# Patient Record
Sex: Female | Born: 1935
Health system: Southern US, Community
[De-identification: ages and names within clinical notes are randomized; demographics above are authoritative.]

## PROBLEM LIST (undated history)

## (undated) ENCOUNTER — Ambulatory Visit (HOSPITAL_COMMUNITY): Discharge status: Absent without leave | Payer: Medicare Other

## (undated) DIAGNOSIS — D649 Anemia, unspecified: Secondary | ICD-10-CM

## (undated) DIAGNOSIS — G4730 Sleep apnea, unspecified: Secondary | ICD-10-CM

## (undated) DIAGNOSIS — I6529 Occlusion and stenosis of unspecified carotid artery: Secondary | ICD-10-CM

## (undated) DIAGNOSIS — T7840XA Allergy, unspecified, initial encounter: Secondary | ICD-10-CM

## (undated) DIAGNOSIS — J45909 Unspecified asthma, uncomplicated: Secondary | ICD-10-CM

## (undated) DIAGNOSIS — E785 Hyperlipidemia, unspecified: Secondary | ICD-10-CM

## (undated) DIAGNOSIS — M810 Age-related osteoporosis without current pathological fracture: Secondary | ICD-10-CM

## (undated) DIAGNOSIS — M1990 Unspecified osteoarthritis, unspecified site: Secondary | ICD-10-CM

## (undated) DIAGNOSIS — K219 Gastro-esophageal reflux disease without esophagitis: Secondary | ICD-10-CM

## (undated) DIAGNOSIS — IMO0001 Reserved for inherently not codable concepts without codable children: Secondary | ICD-10-CM

## (undated) DIAGNOSIS — G709 Myoneural disorder, unspecified: Secondary | ICD-10-CM

## (undated) DIAGNOSIS — I10 Essential (primary) hypertension: Secondary | ICD-10-CM

## (undated) HISTORY — PX: OTHER SURGICAL HISTORY: SHX169

## (undated) HISTORY — DX: Allergy, unspecified, initial encounter: T78.40XA

## (undated) HISTORY — DX: Occlusion and stenosis of unspecified carotid artery: I65.29

## (undated) HISTORY — DX: Myoneural disorder, unspecified: G70.9

## (undated) HISTORY — DX: Hyperlipidemia, unspecified: E78.5

## (undated) HISTORY — DX: Unspecified osteoarthritis, unspecified site: M19.90

## (undated) HISTORY — DX: Sleep apnea, unspecified: G47.30

## (undated) HISTORY — DX: Anemia, unspecified: D64.9

## (undated) HISTORY — DX: Unspecified asthma, uncomplicated: J45.909

## (undated) HISTORY — DX: Gastro-esophageal reflux disease without esophagitis: K21.9

## (undated) HISTORY — DX: Essential (primary) hypertension: I10

## (undated) HISTORY — DX: Age-related osteoporosis without current pathological fracture: M81.0

## (undated) HISTORY — PX: COLONOSCOPY: SHX174

## (undated) HISTORY — PX: POLYPECTOMY: SHX149

## (undated) HISTORY — PX: CAROTID ARTERY ANGIOPLASTY: SHX1300

## (undated) HISTORY — DX: Reserved for inherently not codable concepts without codable children: IMO0001

---

## 1998-12-11 ENCOUNTER — Other Ambulatory Visit: Admission: RE | Admit: 1998-12-11 | Discharge: 1998-12-11 | Payer: Self-pay | Admitting: Family Medicine

## 1999-03-31 ENCOUNTER — Other Ambulatory Visit: Admission: RE | Admit: 1999-03-31 | Discharge: 1999-03-31 | Payer: Self-pay | Admitting: Family Medicine

## 2000-03-23 ENCOUNTER — Other Ambulatory Visit: Admission: RE | Admit: 2000-03-23 | Discharge: 2000-03-23 | Payer: Self-pay | Admitting: *Deleted

## 2001-06-22 ENCOUNTER — Other Ambulatory Visit: Admission: RE | Admit: 2001-06-22 | Discharge: 2001-06-22 | Payer: Self-pay | Admitting: Family Medicine

## 2001-07-30 ENCOUNTER — Other Ambulatory Visit: Admission: RE | Admit: 2001-07-30 | Discharge: 2001-07-30 | Payer: Self-pay | Admitting: Family Medicine

## 2001-08-02 ENCOUNTER — Other Ambulatory Visit: Admission: RE | Admit: 2001-08-02 | Discharge: 2001-08-02 | Payer: Self-pay | Admitting: Family Medicine

## 2002-01-02 ENCOUNTER — Encounter: Payer: Self-pay | Admitting: Family Medicine

## 2002-01-02 ENCOUNTER — Encounter: Admission: RE | Admit: 2002-01-02 | Discharge: 2002-01-02 | Payer: Self-pay | Admitting: Family Medicine

## 2002-05-14 ENCOUNTER — Other Ambulatory Visit: Admission: RE | Admit: 2002-05-14 | Discharge: 2002-05-14 | Payer: Self-pay | Admitting: Family Medicine

## 2002-12-05 ENCOUNTER — Encounter: Admission: RE | Admit: 2002-12-05 | Discharge: 2002-12-05 | Payer: Self-pay | Admitting: Family Medicine

## 2002-12-05 ENCOUNTER — Encounter: Payer: Self-pay | Admitting: Family Medicine

## 2003-07-09 ENCOUNTER — Ambulatory Visit (HOSPITAL_BASED_OUTPATIENT_CLINIC_OR_DEPARTMENT_OTHER): Admission: RE | Admit: 2003-07-09 | Discharge: 2003-07-09 | Payer: Self-pay | Admitting: *Deleted

## 2003-07-09 ENCOUNTER — Ambulatory Visit (HOSPITAL_COMMUNITY): Admission: RE | Admit: 2003-07-09 | Discharge: 2003-07-09 | Payer: Self-pay | Admitting: *Deleted

## 2004-07-26 ENCOUNTER — Other Ambulatory Visit: Admission: RE | Admit: 2004-07-26 | Discharge: 2004-07-26 | Payer: Self-pay | Admitting: Family Medicine

## 2004-08-10 ENCOUNTER — Ambulatory Visit: Payer: Self-pay | Admitting: Gastroenterology

## 2004-09-09 ENCOUNTER — Ambulatory Visit: Payer: Self-pay | Admitting: Gastroenterology

## 2004-09-27 ENCOUNTER — Ambulatory Visit (HOSPITAL_BASED_OUTPATIENT_CLINIC_OR_DEPARTMENT_OTHER): Admission: RE | Admit: 2004-09-27 | Discharge: 2004-09-27 | Payer: Self-pay | Admitting: Family Medicine

## 2004-10-03 ENCOUNTER — Ambulatory Visit: Payer: Self-pay | Admitting: Internal Medicine

## 2005-07-28 ENCOUNTER — Other Ambulatory Visit: Admission: RE | Admit: 2005-07-28 | Discharge: 2005-07-28 | Payer: Self-pay | Admitting: Family Medicine

## 2006-02-28 ENCOUNTER — Ambulatory Visit: Payer: Self-pay | Admitting: Gastroenterology

## 2006-08-22 ENCOUNTER — Other Ambulatory Visit: Admission: RE | Admit: 2006-08-22 | Discharge: 2006-08-22 | Payer: Self-pay | Admitting: Family Medicine

## 2006-10-13 ENCOUNTER — Encounter: Admission: RE | Admit: 2006-10-13 | Discharge: 2006-10-13 | Payer: Self-pay | Admitting: Neurosurgery

## 2007-05-24 ENCOUNTER — Ambulatory Visit (HOSPITAL_COMMUNITY): Admission: RE | Admit: 2007-05-24 | Discharge: 2007-05-24 | Payer: Self-pay | Admitting: Family Medicine

## 2008-07-04 HISTORY — PX: KNEE ARTHROSCOPY: SUR90

## 2009-07-04 HISTORY — PX: JOINT REPLACEMENT: SHX530

## 2010-07-14 ENCOUNTER — Ambulatory Visit
Admission: RE | Admit: 2010-07-14 | Discharge: 2010-07-14 | Payer: Self-pay | Source: Home / Self Care | Attending: Vascular Surgery | Admitting: Vascular Surgery

## 2010-07-14 ENCOUNTER — Ambulatory Visit: Admit: 2010-07-14 | Payer: Self-pay | Admitting: Vascular Surgery

## 2010-07-15 ENCOUNTER — Ambulatory Visit (HOSPITAL_COMMUNITY)
Admission: RE | Admit: 2010-07-15 | Discharge: 2010-07-15 | Payer: Self-pay | Source: Home / Self Care | Attending: Vascular Surgery | Admitting: Vascular Surgery

## 2010-07-19 ENCOUNTER — Encounter: Payer: Self-pay | Admitting: Vascular Surgery

## 2010-07-19 ENCOUNTER — Inpatient Hospital Stay (HOSPITAL_COMMUNITY)
Admission: RE | Admit: 2010-07-19 | Discharge: 2010-07-21 | Payer: Self-pay | Source: Home / Self Care | Attending: Vascular Surgery | Admitting: Vascular Surgery

## 2010-07-19 HISTORY — PX: CAROTID ENDARTERECTOMY: SUR193

## 2010-07-19 LAB — POCT I-STAT, CHEM 8
BUN: 23 mg/dL (ref 6–23)
Calcium, Ion: 1.1 mmol/L — ABNORMAL LOW (ref 1.12–1.32)
Chloride: 103 mEq/L (ref 96–112)
Creatinine, Ser: 1.1 mg/dL (ref 0.4–1.2)
Glucose, Bld: 152 mg/dL — ABNORMAL HIGH (ref 70–99)
HCT: 40 % (ref 36.0–46.0)
Hemoglobin: 13.6 g/dL (ref 12.0–15.0)
Potassium: 3.5 mEq/L (ref 3.5–5.1)
Sodium: 141 mEq/L (ref 135–145)
TCO2: 30 mmol/L (ref 0–100)

## 2010-07-19 LAB — URINALYSIS, ROUTINE W REFLEX MICROSCOPIC
Bilirubin Urine: NEGATIVE
Hgb urine dipstick: NEGATIVE
Ketones, ur: NEGATIVE mg/dL
Nitrite: NEGATIVE
Protein, ur: NEGATIVE mg/dL
Specific Gravity, Urine: 1.005 (ref 1.005–1.030)
Urine Glucose, Fasting: NEGATIVE mg/dL
Urobilinogen, UA: 0.2 mg/dL (ref 0.0–1.0)
pH: 7.5 (ref 5.0–8.0)

## 2010-07-19 LAB — COMPREHENSIVE METABOLIC PANEL
ALT: 21 U/L (ref 0–35)
AST: 27 U/L (ref 0–37)
Albumin: 4.5 g/dL (ref 3.5–5.2)
Alkaline Phosphatase: 34 U/L — ABNORMAL LOW (ref 39–117)
BUN: 16 mg/dL (ref 6–23)
CO2: 29 mEq/L (ref 19–32)
Calcium: 9.6 mg/dL (ref 8.4–10.5)
Chloride: 100 mEq/L (ref 96–112)
Creatinine, Ser: 1.01 mg/dL (ref 0.4–1.2)
GFR calc Af Amer: >60 mL/min (ref >60)
GFR calc non Af Amer: 54 mL/min — ABNORMAL LOW (ref >60)
Glucose, Bld: 140 mg/dL — ABNORMAL HIGH (ref 70–99)
Potassium: 3.4 mEq/L — ABNORMAL LOW (ref 3.5–5.1)
Sodium: 139 mEq/L (ref 135–145)
Total Bilirubin: 0.8 mg/dL (ref 0.3–1.2)
Total Protein: 6.7 g/dL (ref 6.0–8.3)

## 2010-07-19 LAB — SURGICAL PCR SCREEN
MRSA, PCR: NEGATIVE
Staphylococcus aureus: NEGATIVE

## 2010-07-19 LAB — CBC
HCT: 40.9 % (ref 36.0–46.0)
Hemoglobin: 14.9 g/dL (ref 12.0–15.0)
MCH: 32.2 pg (ref 26.0–34.0)
MCHC: 36.4 g/dL — ABNORMAL HIGH (ref 30.0–36.0)
MCV: 88.3 fL (ref 78.0–100.0)
Platelets: 195 10*3/uL (ref 150–400)
RBC: 4.63 MIL/uL (ref 3.87–5.11)
RDW: 12.4 % (ref 11.5–15.5)
WBC: 7.1 10*3/uL (ref 4.0–10.5)

## 2010-07-19 LAB — PROTIME-INR
INR: 0.97 (ref 0.00–1.49)
Prothrombin Time: 13.1 seconds (ref 11.6–15.2)

## 2010-07-19 LAB — APTT: aPTT: 28 seconds (ref 24–37)

## 2010-07-19 LAB — ABO/RH: ABO/RH(D): A POS

## 2010-07-19 LAB — TYPE AND SCREEN
ABO/RH(D): A POS
Antibody Screen: NEGATIVE

## 2010-07-21 LAB — GLUCOSE, CAPILLARY
Glucose-Capillary: 172 mg/dL — ABNORMAL HIGH (ref 70–99)
Glucose-Capillary: 185 mg/dL — ABNORMAL HIGH (ref 70–99)
Glucose-Capillary: 157 mg/dL — ABNORMAL HIGH (ref 70–99)
Glucose-Capillary: 162 mg/dL — ABNORMAL HIGH (ref 70–99)
Glucose-Capillary: 167 mg/dL — ABNORMAL HIGH (ref 70–99)

## 2010-07-21 LAB — CBC
HCT: 30 % — ABNORMAL LOW (ref 36.0–46.0)
Hemoglobin: 10.6 g/dL — ABNORMAL LOW (ref 12.0–15.0)
MCH: 30.9 pg (ref 26.0–34.0)
MCHC: 35.3 g/dL (ref 30.0–36.0)
MCV: 87.5 fL (ref 78.0–100.0)
Platelets: 177 10*3/uL (ref 150–400)
RBC: 3.43 MIL/uL — ABNORMAL LOW (ref 3.87–5.11)
RDW: 12.5 % (ref 11.5–15.5)
WBC: 6.7 10*3/uL (ref 4.0–10.5)

## 2010-07-21 LAB — BASIC METABOLIC PANEL
BUN: 13 mg/dL (ref 6–23)
CO2: 28 mEq/L (ref 19–32)
Calcium: 9 mg/dL (ref 8.4–10.5)
Chloride: 104 mEq/L (ref 96–112)
Creatinine, Ser: 1.01 mg/dL (ref 0.4–1.2)
GFR calc Af Amer: >60 mL/min (ref >60)
GFR calc non Af Amer: 54 mL/min — ABNORMAL LOW (ref >60)
Glucose, Bld: 162 mg/dL — ABNORMAL HIGH (ref 70–99)
Potassium: 3.5 mEq/L (ref 3.5–5.1)
Sodium: 139 mEq/L (ref 135–145)

## 2010-07-23 ENCOUNTER — Inpatient Hospital Stay (HOSPITAL_COMMUNITY)
Admission: EM | Admit: 2010-07-23 | Discharge: 2010-07-25 | Payer: Self-pay | Source: Home / Self Care | Attending: Vascular Surgery | Admitting: Vascular Surgery

## 2010-07-25 ENCOUNTER — Encounter: Payer: Self-pay | Admitting: Family Medicine

## 2010-07-26 LAB — DIFFERENTIAL
Basophils Absolute: 0 10*3/uL (ref 0.0–0.1)
Basophils Relative: 0 % (ref 0–1)
Eosinophils Absolute: 0.2 10*3/uL (ref 0.0–0.7)
Eosinophils Relative: 4 % (ref 0–5)
Lymphocytes Relative: 26 % (ref 12–46)
Lymphs Abs: 1.5 10*3/uL (ref 0.7–4.0)
Monocytes Absolute: 0.5 10*3/uL (ref 0.1–1.0)
Monocytes Relative: 8 % (ref 3–12)
Neutro Abs: 3.7 10*3/uL (ref 1.7–7.7)
Neutrophils Relative %: 62 % (ref 43–77)

## 2010-07-26 LAB — CBC
HCT: 34.5 % — ABNORMAL LOW (ref 36.0–46.0)
Hemoglobin: 12.2 g/dL (ref 12.0–15.0)
MCH: 31 pg (ref 26.0–34.0)
MCHC: 35.4 g/dL (ref 30.0–36.0)
MCV: 87.6 fL (ref 78.0–100.0)
Platelets: 251 10*3/uL (ref 150–400)
RBC: 3.94 MIL/uL (ref 3.87–5.11)
RDW: 12.2 % (ref 11.5–15.5)
WBC: 6 10*3/uL (ref 4.0–10.5)

## 2010-07-26 LAB — POCT I-STAT, CHEM 8
BUN: 11 mg/dL (ref 6–23)
Calcium, Ion: 1.16 mmol/L (ref 1.12–1.32)
Chloride: 97 mEq/L (ref 96–112)
Creatinine, Ser: 0.9 mg/dL (ref 0.4–1.2)
Glucose, Bld: 128 mg/dL — ABNORMAL HIGH (ref 70–99)
HCT: 37 % (ref 36.0–46.0)
Hemoglobin: 12.6 g/dL (ref 12.0–15.0)
Potassium: 3.3 mEq/L — ABNORMAL LOW (ref 3.5–5.1)
Sodium: 138 mEq/L (ref 135–145)
TCO2: 34 mmol/L (ref 0–100)

## 2010-07-26 LAB — GLUCOSE, CAPILLARY: Glucose-Capillary: 144 mg/dL — ABNORMAL HIGH (ref 70–99)

## 2010-07-27 ENCOUNTER — Ambulatory Visit: Admit: 2010-07-27 | Payer: Self-pay | Admitting: Vascular Surgery

## 2010-07-27 LAB — GLUCOSE, CAPILLARY
Glucose-Capillary: 129 mg/dL — ABNORMAL HIGH (ref 70–99)
Glucose-Capillary: 238 mg/dL — ABNORMAL HIGH (ref 70–99)
Glucose-Capillary: 182 mg/dL — ABNORMAL HIGH (ref 70–99)
Glucose-Capillary: 122 mg/dL — ABNORMAL HIGH (ref 70–99)

## 2010-07-30 NOTE — Op Note (Signed)
NAME:  Sara Holt, Sara Holt             ACCOUNT NO.:  [id number]  MEDICAL RECORD NO.:  [id number]          PATIENT TYPE:  INP  LOCATION:  3304                         FACILITY:  [facility]  PHYSICIAN:  Larina Earthly, M.D.    DATE OF BIRTH:  1934-08-28  DATE OF PROCEDURE:  07/19/2010 DATE OF DISCHARGE:                              OPERATIVE REPORT   PREOPERATIVE DIAGNOSIS:  Severe symptomatic left carotid disease.  POSTOPERATIVE DIAGNOSIS:  Severe symptomatic left carotid disease.  PROCEDURE:  Left carotid endarterectomy and Dacron patch angioplasty.  SURGEON:  Larina Earthly, MD  ASSISTANT:  Della Goo, PA-C  ANESTHESIA:  General endotracheal.  COMPLICATIONS:  None.  DISPOSITION:  To recovery room in stable.  PROCEDURE IN DETAIL:  The patient was taken to the operating room, placed in the supine position where the area of the left neck prepped and draped in usual sterile fashion.  Incision was made  anterior sternocleidomastoid, carried down through the platysma with electrocautery.  Sternocleidomastoid reflected posteriorly and the carotid sheath was opened.  Facial vein was ligated with 2-0 silk ties and divided.  The common carotid artery was encircled with umbilical tape and Rumel tourniquet.  The patient did have a pulse in her common carotid artery, had a known distal common carotid artery occlusion by arteriogram, duplex and CT angiogram.  The superior thyroid artery was circled with a 2-0 silk Potts tie.  The external carotid was circled with a blue vessel loop.  The internal carotid was circled with umbilical tape and Rumel tourniquet.  The vagus and hypoglossal nerves were identified and preserved.  The patient was given 7000 units of intravenous heparin.  After adequate circulation time, the internal and external common carotid arteries were occluded.  The common carotid artery was opened with an 11 blade, sewn with 2-0 Potts scissors through the extensive plaque  onto the internal carotid artery.  There was clot in the common carotid artery.  A #4 Fogarty catheter was passed proximally down the common carotid artery and no further clot was removed with excellent inflow.  A 10 shunt was passed up the internal carotid artery to allow the back bleed and then down the common carotid were secured with Rumel tourniquets.  Endarterectomy was begun on the common carotid artery and the plaques was divided proximally with Potts scissors.  This endarterectomy was carried down to the bifurcation.  The external carotid endarterectomy with eversion technique and the internal carotid endarterectomized in open fashion.  Remaining atheromatous debris was removed from endarterectomy plane.  Finesse Hemashield Dacron patch brought onto the field and sewn as a patch angioplasty with a running 6-0 Prolene suture.  Prior to completion of the anastomosis, the shunt was removed and the usual flushing maneuvers were undertaken.  The anastomosis was completed.  Flow restored first to the external and then the internal carotid artery.  Excellent flow characteristics were noted with handheld Doppler in the internal and external carotid arteries. The patient was given 50 mg of protamine to reverse heparin.  Several additional sutures were required for hemostasis in the suture line.  The wounds were irrigated with saline.  Hemostasis was obtained with electrocautery.  The wounds were closed with several 3-0 Vicryl sutures, reapproximated sternocleidomastoid over the carotid sheath.  Next, the platysma was covered with running 3-0 Vicryl sutures.  Finally, the skin was closed with 4-0 subcuticular stitch.  Sterile dressing was applied.  The patient was taken to the recovery room in stable condition.     Larina Earthly, M.D.     TFE/MEDQ  D:  07/19/2010  T:  07/20/2010  Job:  [id number]  Electronically Signed by Vinie Charity M.D. on 07/30/2010 03:33:03 PM

## 2010-07-30 NOTE — H&P (Signed)
  NAME:  Sara Holt, Sara Holt             ACCOUNT NO.:  [id number]  MEDICAL RECORD NO.:  [id number]          PATIENT TYPE:  INP  LOCATION:  3705                         FACILITY:  [facility]  PHYSICIAN:  Larina Earthly, M.D.    DATE OF BIRTH:  10-Apr-1935  DATE OF ADMISSION:  07/23/2010 DATE OF DISCHARGE:                             HISTORY & PHYSICAL   ADMISSION DIAGNOSIS:  Severe headache status post left carotid endarterectomy on July 19, 2010.  HISTORY OF PRESENT ILLNESS:  Senna Lape is an otherwise healthy 66- year white female who had a symptomatic left carotid occlusion.  She underwent a workup, this confirmed an occlusion of her common carotid artery over a focal area and severe plaque at the bifurcation. On January 16, she underwent uneventful endarterectomy.  On postoperative day #1, she had a severe global headache and was kept for an additional day in the hospital, was discharged to home on postoperative day #2 with typical temporal headache and left neck soreness seen after carotid surgery.  She presents back to the emergency department today on January 20 with continued persistent severe headache.  She denies any seizure activity.  No focal weakness.  Specifically, no left or right arm weakness.  No speech disturbance and no visual disturbances.  The patient had had symptoms related to her carotid prior to the surgery where she had a 10-minute episode of total blindness in the left eye on New Year's Eve.  PAST HISTORY:  Significant for: 1. Non-insulin diabetes. 2. Hypertension. 3. Hyperlipidemia. 4. Status post left total knee replacement. 5. Carpal tunnel release.  PHYSICAL EXAMINATION:  VITAL SIGNS:  Blood pressure is 130 systolic, temperature is 98.00, respirations 18, heart rate is 65. SKIN:  Her left neck incision is well healed without any evidence of hematoma.  She does have 2+ radial pulses bilaterally. NEUROLOGIC:  She is grossly intact neurologically with  no focal weakness.  IMPRESSION:  Headache following endarterectomy.  PLAN:  The patient will be admitted for pain control.  We will obtain a CT scan to rule out any intracranial abnormality, and we will have consulted Neurology for assistance with headache control.  I did discuss this with the patient and her husband present explaining to be the low concern.  This could be reperfusion injury and would keep close eye on her blood pressure during this admission as well.     Larina Earthly, M.D.     TFE/MEDQ  D:  07/23/2010  T:  07/24/2010  Job:  [id number]  Electronically Signed by TODD EARLY M.D. on 07/30/2010 03:33:08 PM

## 2010-07-30 NOTE — Discharge Summary (Signed)
NAME:  Sara Holt, BOISE             ACCOUNT NO.:  [id number]  MEDICAL RECORD NO.:  [id number]          PATIENT TYPE:  INP  LOCATION:  3304                         FACILITY:  [facility]  PHYSICIAN:  Larina Earthly, M.D.    DATE OF BIRTH:  26-Sep-1934  DATE OF ADMISSION:  07/19/2010 DATE OF DISCHARGE:  07/21/2010                              DISCHARGE SUMMARY   ADMIT DIAGNOSIS:  Symptomatic left internal carotid artery stenosis.  PAST MEDICAL HISTORY AND DISCHARGE DIAGNOSES: 1. Symptomatic left internal carotid artery stenosis status post left     carotid endarterectomy. 2. Non-insulin-dependent diabetes. 3. Hypertension. 4. Hyperlipidemia. 5. Status post left knee replacement and carpal tunnel release     bilaterally.  ALLERGIES:  No known drug allergies.  BRIEF HISTORY:  The patient is a 76 year old female who presented to Dr. Arbie Cookey for evaluation of symptomatic left internal carotid artery stenosis.  She states that on New Year's Eve, she had a 10-minute episode of total blindness in her left eye which resolved.  She subsequently had a workup with an MRI and an MRA of the brain which showed no intracranial disease with potential proximal disease and a carotid Duplex which showed severe bilateral carotid stenosis.  Her repeat carotid Duplex revealed 80-99% right internal carotid artery stenosis and a 100% occlusion on the left internal carotid artery.  The patient had a prior history of cervical disk disease with some right arm weakness and numbness, but no acute focal deficits after the eye episode.  Dr. Arbie Cookey felt that the patient should undergo a carotid angiogram which was performed by Dr. Imogene Burn on July 15, 2010.  This revealed a patent innominate proximal left common carotid and left subclavian artery, patent right internal carotid artery and external carotid arteries of left and 50% right internal carotid artery stenosis, and a left common carotid that occludes shortly after  the takeoff from the aortic arch.  Secondary to these findings, it was felt that the patient should undergo left carotid endarterectomy for symptomatic disease.    Hospital Course:  The patient was admitted and taken to the OR on July 19, 2010, for a left carotid endarterectomy with Dacron patch angioplasty. The patient tolerated the procedure well and was hemodynamically stable immediately postoperatively.  The patient was extubated without complication and woke up from anesthesia neurologically intact.  The patient was transferred from the OR to postanesthesia care unit in stable condition.  The patient was then transferred to unit 3300 also without difficulty.  On postoperative day 1, the patient was complaining of a headache with nausea and vomiting overnight.  She was neurologically intact and the left neck was soft with no evidence of hematoma.  Secondary to her headaches which were likely due to reperfusion and her nausea and vomiting, it was felt that she should stay for an additional day of observation.  On July 21, 2010, the patient is doing well.  She feels well and is without complaint.  She does have some mild discomfort at the left temple area.  She is neurologically intact and the incision is clean, dry, and intact with no evidence of hematoma.  The patient feels well and is felt ready for discharge home in stable condition.  LABORATORY DATA:  CBC on July 20, 2010; white count 6.7, hemoglobin 10.6, hematocrit 30, platelets 177.  BMP on July 20, 2010; sodium 139, potassium 3.5, BUN 13, creatinine 1.0.  DISCHARGE INSTRUCTIONS:  The patient is given specific written instructions regarding diet, activity level, and wound care.  The patient is to contact the office at [phone] if she experiences any difficulty swallowing or speaking, weakness in the arms or legs that is new, or severe headaches.  She is also to contact the office if she has increased swelling in  the neck and/or having difficulty breathing.  The patient has a followup appointment with Dr. Arbie Cookey in approximately 2 weeks.  The office will contact the patient with the date and time of the appointment.  MEDICATIONS: 1. Calcium OTC 1 p.o. t.i.d. 2. Hydrochlorothiazide 25 mg 1 q.a.m. 3. Hydrocodone/acetaminophen 5/500 mg 1 p.o. q.6 h. p.r.n. pain. 4. Gabapentin 600 mg 1 p.o. daily. 5. Lutein 6 mg 1 p.o. nightly. 6. Metoprolol XL 100 mg 1 p.o. daily. 7. Plavix 75 mg 1 p.o. daily. 8. Ramipril 10 mg 1 p.o. daily. 9. Simvastatin 10 mg 1 p.o. daily. 10.Systane Ultra eye drop OTC 1 drop both eyes daily p.r.n.     Pecola Leisure, PA   ______________________________ Larina Earthly, M.D.    AY/MEDQ  D:  07/21/2010  T:  07/21/2010  Job:  [id number]  Electronically Signed by Pecola Leisure PA on 07/22/2010 09:57:25 AM Electronically Signed by TODD EARLY M.D. on 07/30/2010 03:33:00 PM

## 2010-08-03 ENCOUNTER — Ambulatory Visit
Admission: RE | Admit: 2010-08-03 | Discharge: 2010-08-03 | Payer: Self-pay | Source: Home / Self Care | Attending: Vascular Surgery | Admitting: Vascular Surgery

## 2010-08-04 NOTE — Assessment & Plan Note (Addendum)
OFFICE VISIT  Sara Holt, Sara Holt DOB:  04-17-1935                                       08/03/2010 EAVWU#:[id number]  This patient presents today for follow-up of her left carotid endarterectomy.  She had a complete occlusion of her common carotid artery with retrograde flow in her external and antegrade flow into the internal carotid with a tight stenosis.  She underwent carotid endarterectomy on 07/19/2010.  She did have readmission several days following this with severe headache that was thought could potentially be related to reperfusion.  She has subsequently resolved this and now has a typical soreness around the incision which is mild.  She does have peri-incisional numbness as well.  Her neck incision has healed quite nicely.  She does not have bruits bilaterally.  Does have 2+ radial pulses bilaterally and is grossly intact neurologically.  I am quite pleased with her result.  We plan to see her again in 6 months with repeat carotid duplex for follow-up of her moderate to severe asymptomatic right carotid stenosis and follow-up of her left endarterectomy site.  She has asked if we would refer her to an ophthalmologist.  She had an episode of the left eye blindness with presumed amaurosis that initiated this evaluation.  We have referred her to Dr. Mckinley Jewel for ongoing follow-up.    Larina Earthly, M.D. Electronically Signed  TFE/MEDQ  D:  08/03/2010  T:  08/04/2010  Job:  [id number]  cc:   Dewain Penning, Dr. Richarda Overlie, M.D.

## 2010-08-04 NOTE — Consult Note (Signed)
  NAME:  Sara Holt, Sara Holt             ACCOUNT NO.:  [id number]  MEDICAL RECORD NO.:  [id number]          PATIENT TYPE:  INP  LOCATION:  3705                         FACILITY:  [facility]  PHYSICIAN:  Marnisha Stampley K. Virl Coble, M.D.DATE OF BIRTH:  03-14-1935  DATE OF CONSULTATION: DATE OF DISCHARGE:  07/25/2010                                CONSULTATION   Arch and carotid arteriogram.  CLINICAL HISTORY:  Blindness in the left eye, transitory.  Abnormal ultrasound of the carotids.  FINDINGS:  The petrous, cavernous, and the supra-cavernous segments of the right internal carotid artery are normally opacified.  Right middle and right anterior septal arteries are also seen to opacify normally into the capillary and the venous phases.  Simultaneous opacification via the anterior communicating artery of the left anterior cerebral artery A2 segment and of the , the A1 segment and left middle cerebral artery is noted .  Mixing with unopacified blood is noted in the proximal left M1 segment suggesting antegrade flow from either the supraclinoid left ICA  via the extracranial ECA  branches on the left and/or retrograde opacification via the left posterior communicating artery is suggested.  There was antegrade flow noted in the vertebral basilar junctions bilaterally on the arch aortogram.  No antegrade flow is noted in the left internal carotid artery at the cranial skull base.  IMPRESSION: 1. Angiographically left cerebral hemisphere supplied primarily from     the right via the anterior communicating artery as described above. 2. High suspicion of proximal left-sided extracranial Left ICA/CCA occlusion with     possible retrograde opacification from the ipsilateral external     carotid artery branches and/or from the posterior circulation via the     posterior communicating artery.          ______________________________ Grandville Silos Corliss Skains, M.D.     SKD/MEDQ  D:  08/03/2010  T:   08/04/2010  Job:  [id number]  Electronically Signed by Julieanne Cotton M.D. on 08/04/2010 03:30:40 PM

## 2010-08-13 NOTE — Discharge Summary (Signed)
  NAME:  Sara Holt, Sara Holt             ACCOUNT NO.:  [id number]  MEDICAL RECORD NO.:  [id number]          PATIENT TYPE:  INP  LOCATION:  3705                         FACILITY:  [facility]  PHYSICIAN:  Della Goo, PA-C  DATE OF BIRTH:  [birth year]  DATE OF ADMISSION:  07/23/2010 DATE OF DISCHARGE:  07/25/2010                              DISCHARGE SUMMARY   ADDENDUM  ADMITTING DIAGNOSES: 1. Severe headache. 2. Status post left carotid endarterectomy.  HISTORY:  [name] is a healthy 76 year old female who had symptomatic left carotid stenosis.  She underwent extensive workup, which confirmed the severe plaque bifurcation on the left side.  She did well, went home.  She had some severe global headache and was kept in the hospital an additional day, she was discharged on the second postoperative day with typical temporal headache and left neck soreness, which is not uncommon after carotid surgery.  She presented back to the emergency room on July 23, 2010, with continued persistent severe headache.  She denied any seizure activity.  No focal weakness.  No left or right arm weakness.  No speech disturbance or visual disturbances. Her symptoms prior to her carotid surgery was a 10-minute episode of total blindness in the left eye in [place] eve.  She was admitted for observation and pain control.  PAST MEDICAL HISTORY: 1. Non-insulin dependent diabetes. 2. Hypertension. 3. Hyperlipidemia. 4. Status post total knee replacement. 5. Carpal tunnel release. 6. TIA with subsequent left carotid endarterectomy.  HOSPITAL COURSE:  The patient was admitted for observation.  She is neurologically intact C2-C12.  She was alert and oriented x3.  She had good equal strength in bilateral upper and lower extremities.  No tongue deviation or facial droop.  She was given medication for the reperfusion headache.  Her CT of the head was negative.  She was given two doses of Decadron.  She  was seen by Neurology, who felt that was a post CEA headache without evidence of stroke and she was discharged to home on July 25, 2010.  DISCHARGE MEDICATIONS: 1. Hydrocodone 5/325 one to two tablets every 4 hours as needed for     pain. 2. Calcium tablets three times day. 3. Gabapentin 600 mg daily. 4. Hydrochlorothiazide 25 mg daily. 5. Lutein 6 mg daily at bedtime. 6. Metoprolol 100 mg daily. 7. Plavix 75 mg daily. 8. Ramipril 10 mg daily. 9. Simvastatin 10 mg daily. 10.Ultra eye drops over-the-counter for dry eyes as needed.  FINAL DIAGNOSIS: 1. 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 2. Her diabetes and hypertension were controlled with her present     medications.  DISPOSITION:  The patient was discharged to home and she will follow up in 2 weeks with Dr. Arbie Cookey.     Della Goo, PA-C     RR/MEDQ  D:  08/11/2010  T:  08/12/2010  Job:  [id number]  Electronically Signed by Della Goo PA on 08/13/2010 04:59:06 PM

## 2010-08-13 NOTE — Discharge Summary (Addendum)
  NAME:  Sara Holt, Sara Holt             ACCOUNT NO.:  [id number]  MEDICAL RECORD NO.:  [id number]          PATIENT TYPE:  INP  LOCATION:  3705                         FACILITY:  [facility]  PHYSICIAN:  Larina Earthly, M.D.    DATE OF BIRTH:  Aug 03, 1934  DATE OF ADMISSION:  07/23/2010 DATE OF DISCHARGE:  07/25/2010                              DISCHARGE SUMMARY   CHIEF COMPLAINT:  Severe headache, status post left carotid endarterectomy on July 19, 2010.  HISTORY OF PRESENT ILLNESS:  Sara Holt is a healthy 76 year old female who has had symptomatic left carotid occlusion.  She underwent a workup confirming an occlusion of her common carotid artery over focal area and severe plaque bifurcation.   DICTATION ENDED AT THIS POINT.     Della Goo, PA-C   ______________________________ Larina Earthly, M.D.    RR/MEDQ  D:  08/11/2010  T:  08/12/2010  Job:  [id number]  Electronically Signed by Della Goo PA on 08/13/2010 04:59:04 PM Electronically Signed by Tawanna Cooler Ashaya Raftery M.D. on 08/16/2010 07:41:38 PM

## 2010-09-06 NOTE — Consult Note (Signed)
NAME:  Sara Holt, Sara Holt             ACCOUNT NO.:  [id number]  MEDICAL RECORD NO.:  [id number]          PATIENT TYPE:  INP  LOCATION:  3705                         FACILITY:  [facility]  PHYSICIAN:  Joycelyn Schmid, MD   DATE OF BIRTH:  1935-01-13  DATE OF CONSULTATION:  07/23/2010 DATE OF DISCHARGE:                                CONSULTATION   REASON FOR CONSULTATION:  Headache.  HISTORY OF PRESENT ILLNESS:  This is a 76 year old female who recently underwent a left CEA on July 19, 2010, due to symptomatic carotid artery stenosis.  The patient's symptoms that led up to the carotid endarterectomy included a 10-minute episode of left eye blindness.  The patient underwent a left carotid endarterectomy on July 19, 2010. She remained in the hospital overnight until July 20, 2010.  The patient states during that time she did have a headache status post surgery which led to a hospital stay on July 20, 2010, to July 21, 2010.  The patient was discharged home on July 21, 2010.  At that time, she states her headache had been relieved but had not fully gone away.  On Thursday, July 22, 2010, her headache did not improve, in fact it became worse throughout the night.  The patient called Vascular Surgery for further recommendations.  At that time, she was told to come to the emergency room to be evaluated for possible bleed.  The patient was brought to the emergency room on July 23, 2010.  Neurology was consulted for further evaluation of the patient.  At present time, the patient is stating her headache is a 2/10, however, the patient had been given Dilaudid while in the emergency department. She states that she has no localizing or lateralizing symptoms.  Her vision is intact.  Her main complaint at this time is a headache that is primarily located on the apex of her scalp.  It is not throbbing in character but more of a constant headache.  No other symptoms such  as nausea, vomiting, diplopia, dysarthria, dysphagia, paresthesias, or weakness has been noted.  PAST MEDICAL HISTORY: 1. Hypertension. 2. Diabetes. 3. Hypercholesterolemia. 4. Symptomatic left ICA stenosis status post left CEA on July 19, 2010. 5. Status post left total knee arthroplasty. 6. Left carpal tunnel release. 7. Obstructive sleep apnea.  PAST SURGICAL HISTORY:  Left total knee arthroplasty and left CEA.  MEDICATIONS:  The patient at home is on calcium, hydrochlorothiazide, hydrocodone, Neurontin, Lutein, metoprolol, Plavix, ramipril, simvastatin, and Systane Ultra.  ALLERGIES:  No known drug allergies.  SOCIAL HISTORY:  The patient is married.  She has 2 children.  She is retired.  She drinks a glass of wine per night.  She does not smoke.  REVIEW OF SYSTEMS:  Positive for headache, otherwise negative.  PHYSICAL EXAMINATION:  VITAL SIGNS:  Blood pressure is 175/68, pulse 66, respirations 12, and temperature 98.3. GENERAL:  The patient is alert and oriented x3.  Carries out 2 and 3- step commands. NEUROLOGIC:  Pupils equal, round, and reactive to light and accommodating.  Conjugate gaze.  Extraocular movements intact.  Visual field is grossly intact.  Face is symmetric.  Tongue is midline.  Uvula is midline.  The patient shows no dysarthria or aphasia.  Facial sensation V1 through V3 is full.  Shoulder shrug and head turn is within normal limits.  Coordination:  Finger-to-nose and heel-to-shin are smooth.  The patient has a 5/5 strength throughout.  Deep tendon reflexes are depressed throughout with downgoing toes.  The patient shows no drift in upper or lower extremities.  The patient's sensation is grossly intact to pinprick and light touch. PULMONARY:  Clear to auscultation bilaterally. CARDIOVASCULAR:  S1 and S2 audible. NECK:  Negative bruits and supple.  LABORATORY DATA:  White blood cells 6.0, platelets 251, hemoglobin 12.2, and hematocrit  34.4.  IMAGING:  No imaging has been assessed at this time.  CT of head without contrast has been ordered.  ASSESSMENT:  This is a 76 year old female with a headache x4 days status post left endarterectomy.  This most likely represents a reperfusion headache.  If CT of head is negative, we recommend symptomatic treatment with pain medications.  I have discussed these findings with Dr. Marjory Lies and he is in agreement.     Felicie Morn, PA-C   ______________________________ Joycelyn Schmid, MD    DS/MEDQ  D:  07/23/2010  T:  07/24/2010  Job:  [id number]  Electronically Signed by Felicie Morn PA-C on 07/30/2010 04:13:44 PM Electronically Signed by Joycelyn Schmid  on 09/06/2010 12:19:09 PM

## 2010-11-16 NOTE — Consult Note (Signed)
NEW PATIENT CONSULTATION   Sara Holt, Sara Holt  DOB:  1935/03/14                                       07/14/2010  JYNWG#:[id number]   The patient presents today for evaluation of extracranial  cerebrovascular occlusive disease.  She is a very pleasant 76 year old  white female with no prior cardiac or vascular disease history.  On New  Year's Eve, she had a 10-minute episode of total blindness in her left  eye which has now resolved.  She called her ophthalmologist who told her  that she did not think this was related to her eye.  She has  subsequently had a workup to include MRI of her brain which was normal,  MRA of her brain which showed no intracranial disease with potential  proximal disease suggested, a duplex showing severe bilateral carotid  disease.  She is tentatively scheduled for echocardiogram next week.  Her carotid duplex showed severe bilateral carotid disease and she is  here for further discussion of this.  She does have a prior history of a  cervical disk disease with some right arm weakness and numbness but no  acute focal deficits.  She denies any prior amaurosis fugax, transient  ischemic attack or stroke.   PAST MEDICAL HISTORY:  Significant for non-insulin diabetes being  treated for 1 year.  She does have a well-controlled hypertension and  elevated cholesterol.  She has no history of cardiac or pulmonary  dysfunction.   PRIOR SURGERIES:  Left knee replacement and carpal tunnel release on  both hands.   SOCIAL HISTORY:  She is married with 2 children.  She is retired.  She  has never smoked and does have 1 glass of wine per night.   FAMILY HISTORY:  Negative for premature atherosclerotic disease.   CURRENT MEDICATIONS:  Hydrochlorothiazide 25 mg daily, ramipril 10 mg  daily, Lipitor 10 mg daily, hydrocodone 2.5/500 three times daily.   REVIEW OF SYSTEMS:  No weight loss or gain.  She weighs 165 pounds, she  is 5 feet 4 inches  tall.  VASCULAR:  Positive for leg pain with walking.  She did have episode of  amaurosis.  CARDIAC:  Otherwise negative except for HPI.  GI:  Otherwise negative except for HPI.  NEUROLOGIC:  otherwise negative except for HPI.  PULMONARY:  Negative.  HEMATOLOGIC:  Negative.  URINARY:  Negative.  ENT:  Positive only for the recent amaurosis.  MUSCULOSKELETAL:  Positive for arthritis joint pain and muscle pain.  PSYCHIATRIC:  Negative.  SKIN:  Negative.   PHYSICAL EXAM:  General:  Well-developed, well-nourished white female  appearing stated age in no acute distress.  Vital signs:  Blood pressure  is 132/80, pulse 64, respirations 16.  HEENT:  Normal.  She has a very  soft right carotid bruit.  No left carotid bruit.  She has 2+ radial  pulses, 2+ femoral and 2+ dorsalis pedis pulses bilaterally.  Heart:  Regular rate and rhythm without murmur.  Chest:  Clear bilaterally  without rales, rhonchi or wheezes.  Abdomen:  Soft, nontender.  No  masses noted.  Musculoskeletal:  No major deformity or cyanosis.  Neurologic:  No focal paresthesias.  Skin:  Without ulcers or rashes.   She underwent repeat duplex in our office to determine if she was a  candidate for endarterectomy based on duplex alone.  This suggested  severe critical stenosis in her right internal carotid artery, although  these velocities may be elevated due to compensatory flow.  It looks as  though she does have on the left side total occlusion of her left common  carotid artery with retrograde flow in her external feeding, antegrade  flow into her internal carotid artery.  This is somewhat different from  the ultrasound at [facility] suggesting severe common carotid artery  stenosis.   I have had a long discussion with the patient and her husband.  I  explained that due to her diffuse disease, it is difficult to tell her  exactly what was going on in her left carotid system, that she would  need arteriography for  further delineation of this.  I explained that if  she does have common carotid artery occlusion, that she would not likely  be a candidate for carotid subclavian bypass or interposition.  If she  has critical stenosis in the common carotid artery without complete  occlusion, she may be a candidate for standard carotid endarterectomy on  the left.  I feel that certainly with this level of disease in the left  carotid system, this is her most likely cause for her amaurosis.  Also  explained that we would further evaluate her right carotid stenoses with  arteriography at the same setting and would make recommendations as to  whether not medical treatment versus staged eventual right  endarterectomy was warranted.  She is for arch and cerebral arteriogram  tomorrow at [facility] with Dr. Leonides Sake and is tentatively  scheduled for a left carotid surgery next Monday on 01/16.  We will  discuss this further after arteriogram tomorrow.  I did discuss  approximately 1% risk of stroke and neurologic deficit with a cerebral  arteriography.     Larina Earthly, M.D.  Electronically Signed   TFE/MEDQ  D:  07/14/2010  T:  07/14/2010  Job:  [id number]   cc:   Dr. Curt Bears  Dr. Carlis Stable

## 2010-11-16 NOTE — Procedures (Signed)
CAROTID DUPLEX EXAM   INDICATION:  Confirmed previously documented carotid disease by outside  facility.   HISTORY:  Diabetes:  no  Cardiac:  no  Hypertension:  yes  Smoking:  no  Previous Surgery:  no  CV History:  Left eye visual disturbances, total loss for 15 minutes and  then cleared from lateral to medial  Amaurosis Fugax Yes  Paresthesias No, Hemiparesis No                                       RIGHT             LEFT  Brachial systolic pressure:         142               148  Brachial Doppler waveforms:         WNL               WNL  Vertebral direction of flow:        Antegrade / PST   Antegrade  DUPLEX VELOCITIES (cm/sec)  CCA peak systolic                   121               occluded  ECA peak systolic                   149               Retrograde  ICA peak systolic                   366               82 (P) 25 (D)  ICA end diastolic                   141               39 (P) 17 (D)  PLAQUE MORPHOLOGY:                  heterogeneous     heterogeneous  PLAQUE AMOUNT:                      Moderate to severe                  100%  PLAQUE LOCATION:                    CCA / ECA / ICA   CCA / ICA   IMPRESSION:  1. 80% to 99% right internal carotid artery stenosis, however      velocities may be increased due to compensatory flow.  2. Type I occlusion of left common carotid artery with retrograde      external carotid artery feeding into the internal carotid artery.  3. Unable to grade left internal carotid artery stenosis.  4. Abnormal right vertebral artery with plaquing of subclavian artery      observed at the vertebral artery ostium.  5. Enlarged vascularized thyroid with multiple cysts observed.   ___________________________________________  Larina Earthly, M.D.   LT/MEDQ  D:  07/14/2010  T:  07/14/2010  Job:  [id number]

## 2010-11-19 NOTE — Op Note (Signed)
NAME:  Sara Holt, Sara Holt                 ACCOUNT NO.:  [id number]   MEDICAL RECORD NO.:  [id number]                   PATIENT TYPE:  AMB   LOCATION:  DSC                                  FACILITY:  [facility]   PHYSICIAN:  Lowell Bouton, M.D.      DATE OF BIRTH:  08-Oct-1934   DATE OF PROCEDURE:  07/09/2003  DATE OF DISCHARGE:                                 OPERATIVE REPORT   PREOPERATIVE DIAGNOSIS:  Left carpal tunnel syndrome.   POSTOPERATIVE DIAGNOSIS:  Left carpal tunnel syndrome.   OPERATION PERFORMED:  Decompression of median nerve, left carpal tunnel.   SURGEON:  Lowell Bouton, M.D.   ANESTHESIA:  0.5% Marcaine local with sedation.   OPERATIVE FINDINGS:  The patient had a no masses in the carpal canal and the  motor branch of the nerve was intact.   DESCRIPTION OF PROCEDURE:  Under 0.5% Marcaine local anesthesia with a  tourniquet on the left arm, the left hand was prepped and draped in the  usual fashion and after exsanguinating the limb, the tourniquet was inflated  to 250 mmHg.  A 3 cm longitudinal incision was made in the palm just ulnar  to the thenar crease.  Sharp dissection was carried through the subcutaneous  tissues.  Blunt dissection was carried down through the superficial fascia  distal to the transverse carpal ligament.  A hemostat was placed in the  carpal canal up against the hook of the hamate and the transverse carpal  ligament was divided on the ulnar border of the median nerve.  The proximal  end of the ligament was divided with the scissors after dissecting the nerve  away from the undersurface of the ligament.  The carpal canal was then  palpated and was found to be adequately decompressed.  The nerve was  examined and the motor branch identified.  The wound was then irrigated with  saline.  The skin was closed with 4-0 nylon suture.  Sterile dressings were  applied followed by a volar wrist splint.  The tourniquet was released  with  good circulation of the hand.  The patient went to the recovery room awake  and stable in  good condition.                                               Lowell Bouton, M.D.    EMM/MEDQ  D:  07/09/2003  T:  07/09/2003  Job:  [id number]

## 2010-11-19 NOTE — Assessment & Plan Note (Signed)
[facility]                           GASTROENTEROLOGY OFFICE NOTE   PRESTON, WEILL              MRN:          [id number]  DATE:02/28/2006                            DOB:          02-09-1935    REASON FOR CONSULTATION:  Diarrhea.   HISTORY OF PRESENT ILLNESS:  Sara Holt is a pleasant 76 year old white  female referred through the courtesy of Dr. Artis Flock for evaluation.  Over the  past two years, has been suffering from diarrhea.  Diarrhea is usually post  prandial and accompanied by urgency and occasional incontinence.  She does  not awake in the night to defecate.  She is without abdominal pain, melena  or hematochezia.  Imodium has not been effective.  She underwent colonoscopy  in December 2006 for history of colon polyps.  Diverticulosis was seen on  the left side.  Over these past few years, [name] has been under  therapy for her hypertension.  She has been on increasing doses of both  Toprol XL and Diovan.  She has been taking the Verelan for several years.   Other medications include Lipitor and Prempro.   ALLERGIES:  None.   PAST MEDICAL HISTORY:  1. Hypertension.  2. Sleep apnea.  3. Status post tubal ligation.   FAMILY HISTORY:  Noncontributory.   MEDICATIONS:  As stated above.   ALLERGIES:  As stated above.   SOCIAL HISTORY:  She does not smoke.  Drinks occasionally.  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   REVIEW OF SYSTEMS:  Positive for feet swelling and back pain.   PHYSICAL EXAMINATION:  VITAL SIGNS:  Pulse 82, blood pressure 134/80, weight  174.  HEENT: EOMI. PERRLA. Sclerae are anicteric.  Conjunctivae are pink.  NECK:  Supple without thyromegaly, adenopathy or carotid bruits.  CHEST:  Clear to auscultation and percussion without adventitious sounds.  CARDIAC:  Regular rhythm; normal S1 S2.  There are no murmurs, gallops or  rubs.  ABDOMEN:  Bowel sounds are  normoactive.  Abdomen is soft, non-tender and non-  distended.  There are no abdominal masses, tenderness, splenic enlargement  or hepatomegaly.  EXTREMITIES:  Full range of motion.  No cyanosis, clubbing or edema.  RECTAL:  Deferred.   IMPRESSION:  Post prandial diarrhea.  I suspect this could be due to her  medications including Toprol or Diovan.  A new structural lesion of the  colon is very unlikely in the face of her recent colonoscopy.  Symptoms are  much too protractive for an infectious etiology.   RECOMMENDATION:  Hold her antihypertensives including the Diovan and/or  Toprol XL.  I have instructed Mrs. Clapsaddle to this under Dr. [facility]  [facility] since he may want to substitute another antihypertensive.  If she  continues to have diarrhea despite holding these medicines, then I  instructed her to return to GI.                                   Barbette Hair. Arlyce Dice, MD, [facility]   RDK/MedQ  DD:  02/28/2006  DT:  03/01/2006  Job #:  [id number]   cc:   Quita Skye. Artis Flock, MD

## 2010-11-19 NOTE — Procedures (Signed)
NAME:  Sara Holt, Sara Holt             ACCOUNT NO.:  [id number]   MEDICAL RECORD NO.:  [id number]          PATIENT TYPE:  OUT   LOCATION:  SLEEP CENTER                 FACILITY:  [facility]   PHYSICIAN:  Clinton D. Maple Hudson, M.D. DATE OF BIRTH:  [birth year]   DATE OF STUDY:  09/27/2004                              NOCTURNAL POLYSOMNOGRAM   DATE OF STUDY:  September 27, 2004   REFERRING PHYSICIAN:  Dr. Bradd Canary   INDICATION FOR STUDY:  Hypersomnia with sleep apnea.  Epworth Sleepiness  Score 12/24, BMI 29, weight 177 pounds.   SLEEP ARCHITECTURE:  Short total sleep time 272 minutes with sleep  efficiency 54%.  Stage I was 21%, stage II 60%, stages III and IV 6%, REM  was 14% of total sleep time.  Sleep latency 47 minutes, REM latency 174  minutes, awake after sleep onset 188 minutes, arousal index 27.  No sleep  medications were taken.  She had difficulty maintaining sleep until around 1  a.m. and was awake after placement of CPAP but eventually settled down and  successfully maintained sleep with far less fragmentation.  Technician  described her as claustrophobic and very nervous about the study initially.   RESPIRATORY DATA:  Respiratory disturbance index (RDI) 32.4 obstructive  events per hour indicating moderate obstructive sleep apnea/hypopnea  syndrome before CPAP.  This included 23 obstructive apneas and 45 hypopneas  before CPAP.  Events were primarily noted while sleeping supine.  REM RDI  8.1.  CPAP was successfully titrated to 7 CWP, RDI 0.4 per hour using a  medium ComfortSelect mask with heated humidifier.   OXYGEN DATA:  Moderate snoring before CPAP with oxygen desaturation to a  nadir of 86%.  After CPAP control oxygen saturation held 95-98% on room air.   CARDIAC DATA:  Normal sinus rhythm.   MOVEMENT/PARASOMNIA:  A total of 82 limb jerks were recorded of which 26  were associated with arousal or awakening for a periodic limb movement with  arousal index of 5.7 per hour which  is increased.   IMPRESSION/RECOMMENDATION:  1.  Moderate obstructive sleep apnea/hypopnea syndrome, respiratory      disturbance index 32.4 per hour with moderate snoring and oxygen      desaturation to 86%.  2.  Successful continuous positive airway pressure titration to 7 CWP,      respiratory disturbance index 0.4 per hour, using a medium ComfortSelect      mask with heated humidifier.  3.  She may benefit from use of a sedative-hypnotic medication at home      during initial adjustment to continuous positive airway pressure if      clinically appropriate.  4.  Periodic limb movement with arousal, 5.7 per hour.  This may partly      reflect the study environment and can be reconsidered for treatment if      appropriate once she is established with continuous positive airway      pressure.      CDY/MEDQ  D:  10/03/2004 10:36:53  T:  10/03/2004 13:47:28  Job:  [id number]

## 2011-02-08 ENCOUNTER — Ambulatory Visit: Payer: Self-pay | Admitting: Vascular Surgery

## 2011-02-08 ENCOUNTER — Other Ambulatory Visit (INDEPENDENT_AMBULATORY_CARE_PROVIDER_SITE_OTHER): Payer: Medicare Other

## 2011-02-08 ENCOUNTER — Other Ambulatory Visit: Payer: Self-pay

## 2011-02-08 DIAGNOSIS — I6529 Occlusion and stenosis of unspecified carotid artery: Secondary | ICD-10-CM

## 2011-02-08 DIAGNOSIS — Z48812 Encounter for surgical aftercare following surgery on the circulatory system: Secondary | ICD-10-CM

## 2011-02-25 NOTE — Procedures (Unsigned)
CAROTID DUPLEX EXAM  INDICATION:  Follow up carotid endarterectomy.  HISTORY: Diabetes:  No. Cardiac:  No. Hypertension:  Yes. Smoking:  No. Previous Surgery:  Left carotid endarterectomy, 07/19/2010. CV History:  History of left eye disturbance, currently asymptomatic. Amaurosis Fugax No, Paresthesias No, Hemiparesis No.                                      RIGHT             LEFT Brachial systolic pressure:         140               144 Brachial Doppler waveforms:         Normal            Normal Vertebral direction of flow:        Antegrade         Antegrade DUPLEX VELOCITIES (cm/sec) CCA peak systolic                   67                100 ECA peak systolic                   87                357 ICA peak systolic                   232               87 ICA end diastolic                   67                27 PLAQUE MORPHOLOGY:                  Mixed             Mixed PLAQUE AMOUNT:                      Moderate          Minimal/moderate PLAQUE LOCATION:                    ICA, ECA, bifurcation               CCA, bifurcation, ECA  IMPRESSION: 1. Right internal carotid artery velocities suggest 60% to 79%     stenosis. 2. Patent left carotid endarterectomy site with no evidence of     restenosis. 3. Left external carotid artery stenosis.  ___________________________________________ Larina Earthly, M.D.  EM/MEDQ  D:  02/08/2011  T:  02/08/2011  Job:  [id number]

## 2011-07-29 ENCOUNTER — Encounter: Payer: Self-pay | Admitting: Gastroenterology

## 2011-08-25 ENCOUNTER — Encounter: Payer: Self-pay | Admitting: Thoracic Diseases

## 2011-08-25 ENCOUNTER — Ambulatory Visit (AMBULATORY_SURGERY_CENTER): Payer: Medicare Other | Admitting: *Deleted

## 2011-08-25 VITALS — Ht 64.0 in | Wt 155.1 lb

## 2011-08-25 DIAGNOSIS — Z1211 Encounter for screening for malignant neoplasm of colon: Secondary | ICD-10-CM

## 2011-08-25 MED ORDER — PEG-KCL-NACL-NASULF-NA ASC-C 100 G PO SOLR: ORAL | Status: DC

## 2011-08-25 NOTE — Progress Notes (Signed)
No allergy to eggs or soy products 

## 2011-08-26 ENCOUNTER — Ambulatory Visit (INDEPENDENT_AMBULATORY_CARE_PROVIDER_SITE_OTHER): Payer: Medicare Other | Admitting: Thoracic Diseases

## 2011-08-26 ENCOUNTER — Other Ambulatory Visit (INDEPENDENT_AMBULATORY_CARE_PROVIDER_SITE_OTHER): Payer: Medicare Other | Admitting: *Deleted

## 2011-08-26 ENCOUNTER — Encounter: Payer: Self-pay | Admitting: Thoracic Diseases

## 2011-08-26 VITALS — BP 132/58 | HR 77 | Resp 16 | Ht 64.0 in | Wt 159.0 lb

## 2011-08-26 DIAGNOSIS — I6529 Occlusion and stenosis of unspecified carotid artery: Secondary | ICD-10-CM

## 2011-08-26 DIAGNOSIS — Z48812 Encounter for surgical aftercare following surgery on the circulatory system: Secondary | ICD-10-CM | POA: Insufficient documentation

## 2011-08-26 NOTE — Patient Instructions (Signed)
Stroke Prevention Some medical conditions and some behaviors are associated with an increased chance of having a stroke. You may prevent a stroke by making healthy choices and managing medical conditions. You can reduce your risk of having a stroke by:  Staying physically active. It is recommended that you get at least 30 minutes of activity on most or all days.   Stop smoking.   Limiting alcohol use. Moderate alcohol use is considered to be: No more than 2 drinks per day for men. No more than 1 drink per day for non-pregnant women.   DIET.  A low fat, low cholesterol, low salt and high fiber diet with servings of fruits and vegetables daily will help .   Managing your cholesterol levels.. Take any prescribed medications to control cholesterol as directed by your caregiver.   Managing your diabetes. Diabetic diet as recommended by the ADA. Take any prescribed medications to control diabetes as directed by your caregiver.   Controlling your high blood pressure (hypertension). It is very important to take any prescribed medications to control hypertension .   Aspirin: Take Aspirin daily as prescribed by your physician. Do not take aspirin with blood thinners unless prescribed by your Physician  You may be on "blood thinners" (anticoagulants) Coumadin, Plavix, Agrennox, Effient. All these medications are helpful in reducing the risk of forming abnormal blood clots. If you have the irregular heart rhythm of atrial fibrillation, you may be on a blood thinner.  Be sure you understand all your medication instructions.   SIGNS OF STROKE: SEEK IMMEDIATE MEDICAL CARE IF: You have sudden weakness or numbness of the face, arm, or leg, especially on 1 side of the body.  You have sudden confusion.  You have trouble speaking (aphasia) or understanding.  You have sudden trouble seeing in 1 or both eyes.  You have sudden trouble walking.  You have dizziness.  You have a loss of balance or coordination.   You have a sudden severe headache with no known cause.    You have new chest pain, angina, or an irregular heartbeat.   ANY OF THESE SYMPTOMS MAY REPRESENT A SERIOUS PROBLEM THAT IS AN EMERGENCY. Do not wait to see if the symptoms will go away. Get medical help right away. Call your local emergency services (911 in [place] DO NOT drive yourself to the hospital. Stroke Prevention Some medical conditions and some behaviors are associated with an increased chance of having a stroke. You may prevent a stroke by making healthy choices and managing medical conditions. You can reduce your risk of having a stroke by:  Staying physically active. It is recommended that you get at least 30 minutes of activity on most or all days.   Not smoking.   Limiting alcohol use. Moderate alcohol use is considered to be:   No more than 2 drinks per day for men.   No more than 1 drink per day for nonpregnant women.   Eating healthy foods. Certain diets may be prescribed to address high blood pressure, high cholesterol, diabetes, or obesity. A diet that includes 5 or more servings of fruits and vegetables a day may reduce the risk of stroke.   Managing your cholesterol levels. A low saturated fat, low trans fat, low cholesterol, and high fiber diet may control cholesterol levels. Take any prescribed medications to control cholesterol as directed by your caregiver.   Managing your diabetes. A controlled carbohydrate, controlled sugar diet is recommended to manage diabetes. Take any prescribed medications to   control diabetes as directed by your caregiver.   Controlling your high blood pressure (hypertension). A low salt (sodium), low saturated fat, low trans fat, and low cholesterol diet is recommended to manage high blood pressure. Take any prescribed medications to control hypertension as directed by your caregiver.   Maintaining a healthy weight. A reduced calorie, low sodium, low saturated fat, low trans fat, low  cholesterol diet is recommended to manage weight.   Stopping drug abuse.   Taking medications as directed by your caregiver. For some individuals, aspirin or "blood thinners" (anticoagulants) are helpful in reducing the risk of forming abnormal blood clots that can lead to stroke. If you have the irregular heart rhythm of atrial fibrillation, you should be on a blood thinner unless there is a good reason you cannot take them. Be sure you understand all your medication instructions.  SEEK IMMEDIATE MEDICAL CARE IF:  You have sudden weakness or numbness of the face, arm, or leg, especially on 1 side of the body.   You have sudden confusion.   You have trouble speaking (aphasia) or understanding.   You have sudden trouble seeing in 1 or both eyes.   You have sudden trouble walking.   You have a loss of balance or coordination.   You have a sudden severe headache with no known cause.    ANY OF THESE SYMPTOMS MAY REPRESENT A SERIOUS PROBLEM THAT IS AN EMERGENCY. Do not wait to see if the symptoms will go away. Get medical help right away. Call your local emergency services (911 in [place] DO NOT drive yourself to the hospital. Document Released: 07/28/2004 Document Re-Released: 12/08/2009 [employer] Patient Information 2011 [employer].  

## 2011-08-26 NOTE — Progress Notes (Signed)
VASCULAR AND VEIN SURGERY CAROTID FOLLOW-UP  Date of Surgery: 07/19/10 LEFT CEA Surgeon: Ilean Skill  HPI: Sara Holt is a 77 y.o. female right handed who has known carotid disease. Patient is doing well. Pt. has had surgical intervention of left CEA .  Patient has Positive history of amaurosis in left eye prior to surgery.  The patient denies further symptoms of amaurosis fugax or monocular blindness.  The patient  denies facial drooping.   Pt. denies headache Pt. denies hemiplegia although she states she had achiness in left arm but no weakness or clumsiness in the arm  The patient denies receptive or expressive aphasia.  Pt. denies weakness in BUE/BLE  Pt. States that the symptoms have improved  Non-Invasive Vascular Imaging CAROTID DUPLEX 08/26/2011  Right ICA 40 - 59 % stenosis Left ICA 20 - 39 % stenosis Multiple "nodes" in thyroid These findings are Improved from previous exam  Physical Exam: Filed Vitals:   08/26/11 1348 08/26/11 1349  BP: 125/58 132/58  Pulse: 76 77  Resp: 16   Height: 5\' 4"  (1.626 m)   Weight: 159 lb (72.122 kg)   SpO2: 96% 97%    Pt is A&O x 3 Gait is normal Negative Bilateral carotid bruit/s Neuro Exam: Speech is fluent Negative weakness BUE/BLE Negative tongue deviation Negative facial droop Pt notes nodule palmar side base of middle finger - 2mm nodule and flexor tendon thickening  Plan: Follow-up in 6 months with Carotid Duplex scan and F/U with Dr. Arbie Cookey  Pt was given information regarding stroke symptoms and prevention Dupuentrens contracture 3rd finger left hand - no sig contracture - hand surgery if needed Multiple "nodes" in thyroid noted on duplex. -refer to Pt. PMD  Clinic MD: [facility]

## 2011-09-08 ENCOUNTER — Encounter: Payer: Self-pay | Admitting: Gastroenterology

## 2011-09-08 ENCOUNTER — Ambulatory Visit (AMBULATORY_SURGERY_CENTER): Payer: Medicare Other | Admitting: Gastroenterology

## 2011-09-08 DIAGNOSIS — Z86010 Personal history of colonic polyps: Secondary | ICD-10-CM

## 2011-09-08 DIAGNOSIS — D126 Benign neoplasm of colon, unspecified: Secondary | ICD-10-CM

## 2011-09-08 DIAGNOSIS — Z1211 Encounter for screening for malignant neoplasm of colon: Secondary | ICD-10-CM

## 2011-09-08 MED ORDER — SODIUM CHLORIDE 0.9 % IV SOLN: 500.0000 mL | INTRAVENOUS | Status: DC

## 2011-09-08 NOTE — Op Note (Signed)
[facility] [address] [place], [place]  [address]  COLONOSCOPY PROCEDURE REPORT  PATIENT:  Sara Holt, Sara Holt  MR#:  [id number] BIRTHDATE:  [birth year], 76 yrs. old  GENDER:  female ENDOSCOPIST:  Barbette Hair. Arlyce Dice, MD REF. BY:  Elby Showers, M.D. PROCEDURE DATE:  09/08/2011 PROCEDURE:  Colonoscopy with snare polypectomy, Colon with cold biopsy polypectomy, Colonoscopy with hot biopsy and snare polypectomy ASA CLASS:  Class II INDICATIONS:  Screening, history of pre-cancerous (adenomatous) colon polyps Index polypectomy 2005 2006 colon negative MEDICATIONS:   MAC sedation, administered by CRNA propofol 400mg IV  DESCRIPTION OF PROCEDURE:   After the risks benefits and alternatives of the procedure were thoroughly explained, informed consent was obtained.  Digital rectal exam was performed and revealed no abnormalities.   The LB PCF-H180AL [id number] endoscope was introduced through the anus and advanced to the cecum, which was identified by both the appendix and ileocecal valve, without limitations.  The quality of the prep was good, using MoviPrep. The instrument was then slowly withdrawn as the colon was fully examined. <<PROCEDUREIMAGES>>  FINDINGS:  There were multiple polyps identified and removed. in the ascending colon. 4 sessile polyps just proximal to cecum ranging from 2-64mm - removed with cold polypectomy snare, hot polypectomy snare, cold bx forceps. Polyp remnants of largest polyp were removed with hot bx forcepts (see image2, image7, and image8).  A sessile polyp was found in the mid transverse colon. It was 2 mm in size. The polyp was removed using cold biopsy forceps (see image9).  There were multiple polyps identified and removed. in the descending colon. 2 3mm sessile polys - removed with cold polypectomy snare. 7mm sessile polyp removed with hot polypectomy snare. All specimens were submitted to pathology (see image12, image13, and image15).   Moderate diverticulosis was found (see image16). Moderately severe diverticular changes  This was otherwise a normal examination of the colon (see image3 and image17). Retroflexed views in the rectum revealed no abnormalities.    The time to cecum =  1) 5.50  minutes. The scope was then withdrawn in 1) 25.0  minutes from the cecum and the procedure completed. COMPLICATIONS:  None ENDOSCOPIC IMPRESSION: 1) Polyps, multiple in the ascending colon 2) 2 mm sessile polyp in the mid transverse colon 3) Polyps, multiple in the descending colon 5) Otherwise normal examination RECOMMENDATIONS: 1) Repeat Colonoscopy in 3 years. REPEAT EXAM:  In 3 year(s) for Colonoscopy.  ______________________________ Barbette Hair. Arlyce Dice, MD  CC:  n. eSIGNED:   Barbette Hair. Perian Tedder at 09/08/2011 10:58 AM  Demetrios Loll, [id number]

## 2011-09-08 NOTE — Progress Notes (Signed)
Patient did not experience any of the following events: a burn prior to discharge; a fall within the facility; wrong site/side/patient/procedure/implant event; or a hospital transfer or hospital admission upon discharge from the facility. (G8907) Patient did not have preoperative order for IV antibiotic SSI prophylaxis. (G8918)  

## 2011-09-08 NOTE — Patient Instructions (Addendum)
Diverticulosis Diverticulosis is a common condition that develops when small pouches (diverticula) form in the wall of the colon. The risk of diverticulosis increases with age. It happens more often in people who eat a low-fiber diet. Most individuals with diverticulosis have no symptoms. Those individuals with symptoms usually experience abdominal pain, constipation, or loose stools (diarrhea). HOME CARE INSTRUCTIONS   Increase the amount of fiber in your diet as directed by your caregiver or dietician. This may reduce symptoms of diverticulosis.   Your caregiver may recommend taking a dietary fiber supplement.   Drink at least 6 to 8 glasses of water each day to prevent constipation.   Try not to strain when you have a bowel movement.   Your caregiver may recommend avoiding nuts and seeds to prevent complications, although this is still an uncertain benefit.   Only take over-the-counter or prescription medicines for pain, discomfort, or fever as directed by your caregiver.  FOODS WITH HIGH FIBER CONTENT INCLUDE:  Fruits. Apple, peach, pear, tangerine, raisins, prunes.   Vegetables. Brussels sprouts, asparagus, broccoli, cabbage, carrot, cauliflower, romaine lettuce, spinach, summer squash, tomato, winter squash, zucchini.   Starchy Vegetables. Baked beans, kidney beans, lima beans, split peas, lentils, potatoes (with skin).   Grains. Whole wheat bread, brown rice, bran flake cereal, plain oatmeal, white rice, shredded wheat, bran muffins.  SEEK IMMEDIATE MEDICAL CARE IF:   You develop increasing pain or severe bloating.   You have an oral temperature above 102 F (38.9 C), not controlled by medicine.   You develop vomiting or bowel movements that are bloody or black.  Document Released: 03/17/2004 Document Revised: 06/09/2011 Document Reviewed: 11/18/2009 [facility] Patient Information 2012 [place], [place]  .Colon Polyps A polyp is extra tissue that grows inside your body. Colon  polyps grow in the large intestine. The large intestine, also called the colon, is part of your digestive system. It is a long, hollow tube at the end of your digestive tract where your body makes and stores stool. Most polyps are not dangerous. They are benign. This means they are not cancerous. But over time, some types of polyps can turn into cancer. Polyps that are smaller than a pea are usually not harmful. But larger polyps could someday become or may already be cancerous. To be safe, doctors remove all polyps and test them.  WHO GETS POLYPS? Anyone can get polyps, but certain people are more likely than others. You may have a greater chance of getting polyps if:  You are over 50.   You have had polyps before.   Someone in your family has had polyps.   Someone in your family has had cancer of the large intestine.   Find out if someone in your family has had polyps. You may also be more likely to get polyps if you:   Eat a lot of fatty foods.   Smoke.   Drink alcohol.   Do not exercise.   Eat too much.  SYMPTOMS  Most small polyps do not cause symptoms. People often do not know they have one until their caregiver finds it during a regular checkup or while testing them for something else. Some people do have symptoms like these:  Bleeding from the anus. You might notice blood on your underwear or on toilet paper after you have had a bowel movement.   Constipation or diarrhea that lasts more than a week.   Blood in the stool. Blood can make stool look black or it can show up  as red streaks in the stool.  If you have any of these symptoms, see your caregiver. HOW DOES THE DOCTOR TEST FOR POLYPS? The doctor can use four tests to check for polyps:  Digital rectal exam. The caregiver wears gloves and checks your rectum (the last part of the large intestine) to see if it feels normal. This test would find polyps only in the rectum. Your caregiver may need to do one of the other  tests listed below to find polyps higher up in the intestine.   Barium enema. The caregiver puts a liquid called barium into your rectum before taking x-rays of your large intestine. Barium makes your intestine look white in the pictures. Polyps are dark, so they are easy to see.   Sigmoidoscopy. With this test, the caregiver can see inside your large intestine. A thin flexible tube is placed into your rectum. The device is called a sigmoidoscope, which has a light and a tiny video camera in it. The caregiver uses the sigmoidoscope to look at the last third of your large intestine.   Colonoscopy. This test is like sigmoidoscopy, but the caregiver looks at all of the large intestine. It usually requires sedation. This is the most common method for finding and removing polyps.  TREATMENT   The caregiver will remove the polyp during sigmoidoscopy or colonoscopy. The polyp is then tested for cancer.   If you have had polyps, your caregiver may want you to get tested regularly in the future.  PREVENTION  There is not one sure way to prevent polyps. You might be able to lower your risk of getting them if you:  Eat more fruits and vegetables and less fatty food.   Do not smoke.   Avoid alcohol.   Exercise every day.   Lose weight if you are overweight.   Eating more calcium and folate can also lower your risk of getting polyps. Some foods that are rich in calcium are milk, cheese, and broccoli. Some foods that are rich in folate are chickpeas, kidney beans, and spinach.   Aspirin might help prevent polyps. Studies are under way.  Document Released: 03/16/2004 Document Revised: 06/09/2011 Document Reviewed: 08/22/2007 [employer] Patient Information 2012 [employer].  YOU HAD AN ENDOSCOPIC PROCEDURE TODAY AT THE [facility]: Refer to the procedure report that was given to you for any specific questions about what was found during the examination.  If the procedure report does not  answer your questions, please call your gastroenterologist to clarify.  If you requested that your care partner not be given the details of your procedure findings, then the procedure report has been included in a sealed envelope for you to review at your convenience later.  YOU SHOULD EXPECT: Some feelings of bloating in the abdomen. Passage of more gas than usual.  Walking can help get rid of the air that was put into your GI tract during the procedure and reduce the bloating. If you had a lower endoscopy (such as a colonoscopy or flexible sigmoidoscopy) you may notice spotting of blood in your stool or on the toilet paper. If you underwent a bowel prep for your procedure, then you may not have a normal bowel movement for a few days.  DIET: Your first meal following the procedure should be a light meal and then it is ok to progress to your normal diet.  A half-sandwich or bowl of soup is an example of a good first meal.  Heavy or fried foods  are harder to digest and may make you feel nauseous or bloated.  Likewise meals heavy in dairy and vegetables can cause extra gas to form and this can also increase the bloating.  Drink plenty of fluids but you should avoid alcoholic beverages for 24 hours.  ACTIVITY: Your care partner should take you home directly after the procedure.  You should plan to take it easy, moving slowly for the rest of the day.  You can resume normal activity the day after the procedure however you should NOT DRIVE or use heavy machinery for 24 hours (because of the sedation medicines used during the test).    SYMPTOMS TO REPORT IMMEDIATELY: A gastroenterologist can be reached at any hour.  During normal business hours, 8:30 AM to 5:00 PM Monday through Friday, call [phone].  After hours and on weekends, please call the GI answering service at [phone] who will take a message and have the physician on call contact you.   Following lower endoscopy (colonoscopy or flexible  sigmoidoscopy):  Excessive amounts of blood in the stool  Significant tenderness or worsening of abdominal pains  Swelling of the abdomen that is new, acute  Fever of 100F or higher  Black, tarry-looking stools  FOLLOW UP: If any biopsies were taken you will be contacted by phone or by letter within the next 1-3 weeks.  Call your gastroenterologist if you have not heard about the biopsies in 3 weeks.  Our staff will call the home number listed on your records the next business day following your procedure to check on you and address any questions or concerns that you may have at that time regarding the information given to you following your procedure. This is a courtesy call and so if there is no answer at the home number and we have not heard from you through the emergency physician on call, we will assume that you have returned to your regular daily activities without incident.  SIGNATURES/CONFIDENTIALITY: You and/or your care partner have signed paperwork which will be entered into your electronic medical record.  These signatures attest to the fact that that the information above on your After Visit Summary has been reviewed and is understood.  Full responsibility of the confidentiality of this discharge information lies with you and/or your care-partner.   Follow up colonoscopy in 3 years

## 2011-09-09 ENCOUNTER — Telehealth: Payer: Self-pay | Admitting: *Deleted

## 2011-09-09 NOTE — Telephone Encounter (Signed)
  Follow up Call-  Call back number 09/08/2011  Post procedure Call Back phone  # [phone] HOME  [phone]  Permission to leave phone message Yes     Patient questions:  Do you have a fever, pain , or abdominal swelling? no Pain Score  0 *  Have you tolerated food without any problems? yes  Have you been able to return to your normal activities? yes  Do you have any questions about your discharge instructions: Diet   no Medications  no Follow up visit  no  Do you have questions or concerns about your Care? no  Actions: * If pain score is 4 or above: No action needed, pain <4.

## 2011-09-14 ENCOUNTER — Encounter: Payer: Self-pay | Admitting: Gastroenterology

## 2011-09-14 NOTE — Procedures (Unsigned)
CAROTID DUPLEX EXAM  INDICATION:  Follow up CEA.  HISTORY: Diabetes:  No. Cardiac:  No. Hypertension:  Yes. Smoking:  No. Previous Surgery:  Left CEA 07/19/2010. CV History: Amaurosis Fugax No, Paresthesias No, Hemiparesis No                                      RIGHT             LEFT Brachial systolic pressure:         142               150 Brachial Doppler waveforms:         WNL               WNL Vertebral direction of flow:        Antegrade         Antegrade DUPLEX VELOCITIES (cm/sec) CCA peak systolic                   81                102 ECA peak systolic                   101               66 ICA peak systolic                   180               66 ICA end diastolic                   59                13 PLAQUE MORPHOLOGY:                  Heterogeneous     Soft PLAQUE AMOUNT:                      Mild to moderate  Mild PLAQUE LOCATION:                    CCA/ICA           CCA/CEA  IMPRESSION: 1. 40-59% right internal carotid artery stenosis.  Velocities from     prior report could not be duplicated during today's exam. 2. Patent left carotid endarterectomy with intimal thickening and     hyperplasia observed in the common carotid artery and carotid     endarterectomy, respectively. 3. Bilateral vertebral arteries are within normal limits. 4. Incidentally there are multiple nodes observed in the thyroid.  ___________________________________________ Larina Earthly, M.D.  LT/MEDQ  D:  08/26/2011  T:  08/26/2011  Job:  [id number]

## 2011-09-22 ENCOUNTER — Other Ambulatory Visit: Payer: Self-pay | Admitting: Internal Medicine

## 2011-09-22 DIAGNOSIS — E041 Nontoxic single thyroid nodule: Secondary | ICD-10-CM

## 2011-09-23 ENCOUNTER — Ambulatory Visit
Admission: RE | Admit: 2011-09-23 | Discharge: 2011-09-23 | Disposition: A | Discharge status: Home / Self Care | Payer: Medicare Other | Source: Ambulatory Visit | Attending: Internal Medicine | Admitting: Internal Medicine

## 2011-09-23 DIAGNOSIS — E041 Nontoxic single thyroid nodule: Secondary | ICD-10-CM

## 2011-09-26 ENCOUNTER — Other Ambulatory Visit: Payer: Medicare Other

## 2011-10-06 ENCOUNTER — Other Ambulatory Visit: Payer: Self-pay | Admitting: Internal Medicine

## 2011-10-10 ENCOUNTER — Ambulatory Visit
Admission: RE | Admit: 2011-10-10 | Discharge: 2011-10-10 | Disposition: A | Discharge status: Home / Self Care | Payer: Medicare Other | Source: Ambulatory Visit | Attending: Internal Medicine | Admitting: Internal Medicine

## 2011-10-28 ENCOUNTER — Emergency Department (HOSPITAL_COMMUNITY): Payer: Medicare Other

## 2011-10-28 ENCOUNTER — Encounter (HOSPITAL_COMMUNITY): Payer: Self-pay | Admitting: *Deleted

## 2011-10-28 ENCOUNTER — Emergency Department (HOSPITAL_COMMUNITY)
Admission: EM | Admit: 2011-10-28 | Discharge: 2011-10-28 | Disposition: A | Discharge status: Home / Self Care | Payer: Medicare Other | Attending: Emergency Medicine | Admitting: Emergency Medicine

## 2011-10-28 DIAGNOSIS — G709 Myoneural disorder, unspecified: Secondary | ICD-10-CM | POA: Insufficient documentation

## 2011-10-28 DIAGNOSIS — E119 Type 2 diabetes mellitus without complications: Secondary | ICD-10-CM | POA: Insufficient documentation

## 2011-10-28 DIAGNOSIS — K219 Gastro-esophageal reflux disease without esophagitis: Secondary | ICD-10-CM | POA: Insufficient documentation

## 2011-10-28 DIAGNOSIS — I10 Essential (primary) hypertension: Secondary | ICD-10-CM | POA: Insufficient documentation

## 2011-10-28 DIAGNOSIS — M1990 Unspecified osteoarthritis, unspecified site: Secondary | ICD-10-CM | POA: Insufficient documentation

## 2011-10-28 DIAGNOSIS — E785 Hyperlipidemia, unspecified: Secondary | ICD-10-CM | POA: Insufficient documentation

## 2011-10-28 DIAGNOSIS — R079 Chest pain, unspecified: Secondary | ICD-10-CM | POA: Insufficient documentation

## 2011-10-28 LAB — BASIC METABOLIC PANEL
CO2: 33 mEq/L — ABNORMAL HIGH (ref 19–32)
Calcium: 10.1 mg/dL (ref 8.4–10.5)
Chloride: 97 mEq/L (ref 96–112)
GFR calc Af Amer: 61 mL/min — ABNORMAL LOW (ref >90)
Sodium: 139 mEq/L (ref 135–145)

## 2011-10-28 LAB — PRO B NATRIURETIC PEPTIDE: Pro B Natriuretic peptide (BNP): 93.9 pg/mL (ref 0–125)

## 2011-10-28 LAB — CBC
MCH: 30.8 pg (ref 26.0–34.0)
Platelets: 204 10*3/uL (ref 150–400)
RBC: 4.41 MIL/uL (ref 3.87–5.11)
RDW: 12.3 % (ref 11.5–15.5)
WBC: 6.2 10*3/uL (ref 4.0–10.5)

## 2011-10-28 LAB — POCT I-STAT TROPONIN I: Troponin i, poc: 0 ng/mL (ref 0.00–0.08)

## 2011-10-28 MED ORDER — ASPIRIN 325 MG PO TABS: 325.0000 mg | ORAL_TABLET | ORAL | Status: AC

## 2011-10-28 MED ORDER — NITROGLYCERIN 0.4 MG SL SUBL: 0.4000 mg | SUBLINGUAL_TABLET | SUBLINGUAL | Status: DC | PRN

## 2011-10-28 MED ORDER — ASPIRIN 81 MG PO CHEW
CHEWABLE_TABLET | ORAL | Status: AC
  Administered 2011-10-28: 324 mg
  Filled 2011-10-28: qty 4

## 2011-10-28 NOTE — ED Provider Notes (Signed)
History     CSN: [id number]  Arrival date & time 10/28/11  1431   First MD Initiated Contact with Patient 10/28/11 1604      Chief Complaint  Patient presents with  . Chest Pain    (Consider location/radiation/quality/duration/timing/severity/associated sxs/prior treatment) HPI History from patient. 77 year old female with past medical history of hyperlipidemia, hypertension, and diet controlled diabetes who presents with chest pain. She states that she was riding in the car this afternoon with her husband when she began to have chest pain, which she describes as starting in her jaw and traveling down into the center of her chest. No radiation into her arms. Pain is described as a pressure type sensation. She did not have any associated dyspnea, diaphoresis, nausea, vomiting, abdominal pain. She and her husband first went to her primary care doctor's office, where he EKG and vital signs were performed. They were told these were generally unremarkable, but were told to come to the ED for further evaluation. Patient states her pain had resolved by the time she was leaving the doctor's office, lasting a total of about 30 minutes. States she feels fine at this time. She has no cardiac history.  Past Medical History  Diagnosis Date  . Allergy   . Diabetes mellitus     DIET CONTROLLED, NO MEDS  . GERD (gastroesophageal reflux disease)   . Hyperlipidemia   . Hypertension   . Neuromuscular disorder     nerve problems with hands  . Arthritis     OA  . Disc     problems in neck and back  . Sleep apnea     Past Surgical History  Procedure Date  . Colonoscopy   . Polypectomy   . Carotid artery angioplasty   . Carpel tunnel     both hands    Family History  Problem Relation Age of Onset  . Colon cancer Maternal Grandmother   . Esophageal cancer Neg Hx   . Stomach cancer Neg Hx   . Rectal cancer Neg Hx     History  Substance Use Topics  . Smoking status: Never Smoker   .  Smokeless tobacco: Not on file  . Alcohol Use: 4.2 oz/week    7 Glasses of wine per week    OB History    Grav Para Term Preterm Abortions TAB SAB Ect Mult Living                  Review of Systems  Constitutional: Negative for fever, chills and diaphoresis.  HENT: Negative for sore throat, trouble swallowing and neck pain.   Eyes: Negative for photophobia and visual disturbance.  Respiratory: Negative for chest tightness and shortness of breath.   Cardiovascular: Negative for chest pain and palpitations.  Gastrointestinal: Negative for nausea, vomiting and abdominal pain.  Musculoskeletal: Negative for myalgias.  Skin: Negative for color change and rash.  Neurological: Negative for dizziness, syncope and weakness.    Allergies  Morphine and related and Sulfa antibiotics  Home Medications   Current Outpatient Rx  Name Route Sig Dispense Refill  . ASPIRIN 81 MG PO TABS Oral Take 81 mg by mouth daily.    Marland Kitchen CALCIUM PLUS VITAMIN D PO Oral Take 500 mg by mouth 3 (three) times daily.    Marland Kitchen CARVEDILOL 6.25 MG PO TABS Oral Take 6.25 mg by mouth Twice daily.     . CYMBALTA 60 MG PO CPEP Oral Take 1 tablet by mouth Daily.     Marland Kitchen  HYDROCHLOROTHIAZIDE 25 MG PO TABS Oral Take 1 tablet by mouth Daily.     . MULTIVITAMIN PO Oral Take 1 tablet by mouth daily.     . OCUVITE-LUTEIN PO CAPS Oral Take 1 capsule by mouth daily.    Marland Kitchen FISH OIL PO Oral Take 1 capsule by mouth daily.    Marland Kitchen OVER THE COUNTER MEDICATION Both Eyes Place 1 drop into both eyes at bedtime. dry eyes med    . RAMIPRIL 10 MG PO CAPS Oral Take 10 mg by mouth Daily.     Marland Kitchen SIMVASTATIN 10 MG PO TABS Oral Take 10 mg by mouth Daily.     . TRAMADOL HCL 50 MG PO TABS Oral Take 50 mg by mouth every 6 (six) hours as needed. For pain      BP 153/92  Pulse 63  Temp(Src) 98.1 F (36.7 C) (Oral)  Resp 18  SpO2 100%  Physical Exam  Nursing note and vitals reviewed. Constitutional: She is oriented to person, place, and time. She  appears well-developed and well-nourished. No distress.  HENT:  Head: Normocephalic and atraumatic.  Eyes: EOM are normal. Pupils are equal, round, and reactive to light.  Neck: Normal range of motion. Neck supple.  Cardiovascular: Normal rate, regular rhythm and normal heart sounds.   Pulmonary/Chest: Effort normal and breath sounds normal. She exhibits no tenderness.       Chest NT to palp  Abdominal: Soft. Bowel sounds are normal. There is no tenderness. There is no rebound and no guarding.  Neurological: She is alert and oriented to person, place, and time.  Skin: Skin is warm and dry. No rash noted. She is not diaphoretic.  Psychiatric: She has a normal mood and affect.    ED Course  Procedures (including critical care time)   Date: 10/28/2011  Rate: 63  Rhythm: normal sinus rhythm  QRS Axis: normal  Intervals: normal  ST/T Wave abnormalities: normal  Conduction Disutrbances:right bundle branch block  Narrative Interpretation:   Old EKG Reviewed: as compared with Jan 2012 no significant changes   Labs Reviewed  BASIC METABOLIC PANEL - Abnormal; Notable for the following:    CO2 33 (*)    Glucose, Bld 101 (*)    GFR calc non Af Amer 52 (*)    GFR calc Af Amer 61 (*)    All other components within normal limits  CBC  PRO B NATRIURETIC PEPTIDE  POCT I-STAT TROPONIN I  TROPONIN I   Dg Chest 2 View  10/28/2011  *RADIOLOGY REPORT*  Clinical Data: Mid chest pain.  CHEST - 2 VIEW  Comparison: Two-view chest 07/15/2010.  Findings: The heart size is normal.  The lungs are clear. Degenerative changes are noted in the thoracolumbar spine with mild curvature, but no significant change.  IMPRESSION:  1.  No acute cardiopulmonary disease or significant interval change.  Original Report Authenticated By: Jamesetta Orleans. MATTERN, M.D.     No diagnosis found.    MDM  4:36 PM Pt assessed. ASA given. Pt pain free so will hold NTG. Labs drawn, CXR ordered. ECG with no notable  changes as compared with previous. Case d/w Dr. Weldon Inches.  8:15 PM Patient has two negative troponins here. Dr. Weldon Inches has discussed with Dr. Anne Fu of [facility]. Feel patient stable for discharge home and follow up in the office as an outpatient early next week. Findings and plan d/w patient. Reasons to return to ED discussed. She verbalized understanding and agreed to plan.  Grant Fontana, [place] 10/28/11 2016

## 2011-10-28 NOTE — ED Provider Notes (Signed)
Medical screening examination/treatment/procedure(s) were conducted as a shared visit with non-physician practitioner(s) and myself.  I personally evaluated the patient during the encounter  Cheri Guppy, MD 10/28/11 [phone]

## 2011-10-28 NOTE — ED Provider Notes (Addendum)
Medical screening examination/treatment/procedure(s) were conducted as a shared visit with non-physician practitioner(s) and myself.  I personally evaluated the patient during the encounter 54 y female with prediabetes c/o chest tightness which radiated to teeth along with nausea earlier today. sxs started while in car.  Lasted approx. 30 min. No smoker. No hx of acs.   asx now.  pe now.   Was seen by FNP and sent here for eval.   ecg no stemi.  Trop #1 nl.  Will repeat trop and consult cards for eval of unstable angina.    Cheri Guppy, MD 10/28/11 1844  I spoke with Dr. Dione Housekeeper. I described pt CRFs = 0, her sxs and ecg and lab findings.  I suggested outpt eval.  He agreed.  He took pt's name and said his office will contact her to arrange f/u.   I explained this to pt and husband. They understand and agree with plan.  As i discussed this, pt adds she has been having difficulty keeping down food recently.  This is going to be evaluated by gi. It also reassures me even more that this is prob. Gi related and not acs. Nevertheless, pt will have cards reevaluation.  Cheri Guppy, MD 10/28/11 2035

## 2011-10-28 NOTE — ED Notes (Signed)
Patient had an episode of mid chest pain that began today while she was driving.  Pain with nausea and pain radiating to chin.   No CP at this time.  No sob with this.

## 2011-10-28 NOTE — Discharge Instructions (Signed)
Your labs today, including two sets of heart enzymes (which look for signs of damage from a heart attack or other damage to the heart) were normal, which is encouraging. We have discussed this with Dr. Anne Fu of [facility]. His office will be contacting you with an appointment to follow up as an outpatient. It is important for you to keep this appointment with his office. If you have recurrent pain this weekend, nausea, vomiting, shortness of breath, or any other worrisome symptoms, please return to the emergency department immediately.  Chest Pain (Nonspecific) It is often hard to give a specific diagnosis for the cause of chest pain. There is always a chance that your pain could be related to something serious, such as a heart attack or a blood clot in the lungs. You need to follow up with your caregiver for further evaluation. CAUSES   Heartburn.   Pneumonia or bronchitis.   Anxiety or stress.   Inflammation around your heart (pericarditis) or lung (pleuritis or pleurisy).   A blood clot in the lung.   A collapsed lung (pneumothorax). It can develop suddenly on its own (spontaneous pneumothorax) or from injury (trauma) to the chest.   Shingles infection (herpes zoster virus).  The chest wall is composed of bones, muscles, and cartilage. Any of these can be the source of the pain.  The bones can be bruised by injury.   The muscles or cartilage can be strained by coughing or overwork.   The cartilage can be affected by inflammation and become sore (costochondritis).  DIAGNOSIS  Lab tests or other studies, such as X-rays, electrocardiography, stress testing, or cardiac imaging, may be needed to find the cause of your pain.  TREATMENT   Treatment depends on what may be causing your chest pain. Treatment may include:   Acid blockers for heartburn.   Anti-inflammatory medicine.   Pain medicine for inflammatory conditions.   Antibiotics if an infection is present.   You may  be advised to change lifestyle habits. This includes stopping smoking and avoiding alcohol, caffeine, and chocolate.   You may be advised to keep your head raised (elevated) when sleeping. This reduces the chance of acid going backward from your stomach into your esophagus.   Most of the time, nonspecific chest pain will improve within 2 to 3 days with rest and mild pain medicine.  HOME CARE INSTRUCTIONS   If antibiotics were prescribed, take your antibiotics as directed. Finish them even if you start to feel better.   For the next few days, avoid physical activities that bring on chest pain. Continue physical activities as directed.   Do not smoke.   Avoid drinking alcohol.   Only take over-the-counter or prescription medicine for pain, discomfort, or fever as directed by your caregiver.   Follow your caregiver's suggestions for further testing if your chest pain does not go away.   Keep any follow-up appointments you made. If you do not go to an appointment, you could develop lasting (chronic) problems with pain. If there is any problem keeping an appointment, you must call to reschedule.  SEEK MEDICAL CARE IF:   You think you are having problems from the medicine you are taking. Read your medicine instructions carefully.   Your chest pain does not go away, even after treatment.   You develop a rash with blisters on your chest.  SEEK IMMEDIATE MEDICAL CARE IF:   You have increased chest pain or pain that spreads to your arm, neck, jaw,  back, or abdomen.   You develop shortness of breath, an increasing cough, or you are coughing up blood.   You have severe back or abdominal pain, feel nauseous, or vomit.   You develop severe weakness, fainting, or chills.   You have a fever.  THIS IS AN EMERGENCY. Do not wait to see if the pain will go away. Get medical help at once. Call your local emergency services (911 in [place] Do not drive yourself to the hospital. MAKE SURE YOU:    Understand these instructions.   Will watch your condition.   Will get help right away if you are not doing well or get worse.  Document Released: 03/30/2005 Document Revised: 06/09/2011 Document Reviewed: 01/24/2008 [facility] Patient Information 2012 [place], [place].

## 2012-02-27 ENCOUNTER — Encounter: Payer: Self-pay | Admitting: Neurosurgery

## 2012-02-28 ENCOUNTER — Other Ambulatory Visit (INDEPENDENT_AMBULATORY_CARE_PROVIDER_SITE_OTHER): Payer: Medicare Other

## 2012-02-28 ENCOUNTER — Ambulatory Visit (INDEPENDENT_AMBULATORY_CARE_PROVIDER_SITE_OTHER): Payer: Medicare Other | Admitting: Neurosurgery

## 2012-02-28 ENCOUNTER — Encounter: Payer: Self-pay | Admitting: Neurosurgery

## 2012-02-28 VITALS — BP 121/71 | HR 66 | Resp 16 | Ht 64.0 in | Wt 159.6 lb

## 2012-02-28 DIAGNOSIS — I6529 Occlusion and stenosis of unspecified carotid artery: Secondary | ICD-10-CM

## 2012-02-28 DIAGNOSIS — Z48812 Encounter for surgical aftercare following surgery on the circulatory system: Secondary | ICD-10-CM

## 2012-02-28 NOTE — Progress Notes (Signed)
[facility] [place] Carotid Office Note  CC: Six-month carotid surveillance Referring Physician: Early  History of Present Illness: 77 year old female patient of Dr. Arbie Cookey status post left CEA January 2012. The patient denies any signs or symptoms of CVA, TIA, amaurosis fugax or any neural deficit. The patient denies any new medical diagnoses or recent surgery.  Past Medical History  Diagnosis Date  . Allergy   . Diabetes mellitus     DIET CONTROLLED, NO MEDS  . GERD (gastroesophageal reflux disease)   . Hyperlipidemia   . Hypertension   . Neuromuscular disorder     nerve problems with hands  . Arthritis     OA  . Disc     problems in neck and back  . Sleep apnea   . Carotid artery occlusion     ROS: [x]  Positive   [ ]  Denies    General: [ ]  Weight loss, [ ]  Fever, [ ]  chills Neurologic: [ ]  Dizziness, [ ]  Blackouts, [ ]  Seizure [ ]  Stroke, [ ]  "Mini stroke", [ ]  Slurred speech, [ ]  Temporary blindness; [ ]  weakness in arms or legs, [ ]  Hoarseness Cardiac: [ ]  Chest pain/pressure, [ ]  Shortness of breath at rest [ ]  Shortness of breath with exertion, [ ]  Atrial fibrillation or irregular heartbeat Vascular: [ ]  Pain in legs with walking, [ ]  Pain in legs at rest, [ ]  Pain in legs at night,  [ ]  Non-healing ulcer, [ ]  Blood clot in vein/DVT,   Pulmonary: [ ]  Home oxygen, [ ]  Productive cough, [ ]  Coughing up blood, [ ]  Asthma,  [ ]  Wheezing Musculoskeletal:  [ ]  Arthritis, [ ]  Low back pain, [ ]  Joint pain Hematologic: [ ]  Easy Bruising, [ ]  Anemia; [ ]  Hepatitis Gastrointestinal: [ ]  Blood in stool, [ ]  Gastroesophageal Reflux/heartburn, [ ]  Trouble swallowing Urinary: [ ]  chronic Kidney disease, [ ]  on HD - [ ]  MWF or [ ]  TTHS, [ ]  Burning with urination, [ ]  Difficulty urinating Skin: [ ]  Rashes, [ ]  Wounds Psychological: [ ]  Anxiety, [ ]  Depression   Social History History  Substance Use Topics  . Smoking status: Never Smoker   . Smokeless  tobacco: Not on file  . Alcohol Use: 4.2 oz/week    7 Glasses of wine per week    Family History Family History  Problem Relation Age of Onset  . Colon cancer Maternal Grandmother   . Cancer Maternal Grandmother     Colon cancer  . Esophageal cancer Neg Hx   . Stomach cancer Neg Hx   . Rectal cancer Neg Hx     Allergies  Allergen Reactions  . Morphine And Related     nausea  . Sulfa Antibiotics     "deathly ill"    Current Outpatient Prescriptions  Medication Sig Dispense Refill  . aspirin 81 MG tablet Take 81 mg by mouth daily.      . Calcium Carbonate-Vitamin D (CALCIUM PLUS VITAMIN D PO) Take 500 mg by mouth 3 (three) times daily.      . carvedilol (COREG) 6.25 MG tablet Take 6.25 mg by mouth Twice daily.       . CYMBALTA 60 MG capsule Take 1 tablet by mouth Daily.       Marland Kitchen desoximetasone (TOPICORT) 0.25 % cream Apply topically continuous as needed.      . hydrochlorothiazide (HYDRODIURIL) 25 MG tablet Take 1 tablet by mouth Daily.       Marland Kitchen  Multiple Vitamins-Minerals (MULTIVITAMIN PO) Take 1 tablet by mouth daily.       . multivitamin-lutein (OCUVITE-LUTEIN) CAPS Take 1 capsule by mouth daily.      Marland Kitchen OVER THE COUNTER MEDICATION Place 1 drop into both eyes at bedtime. dry eyes med      . ramipril (ALTACE) 10 MG capsule Take 10 mg by mouth Daily.       . simvastatin (ZOCOR) 10 MG tablet Take 10 mg by mouth Daily.       . traMADol (ULTRAM) 50 MG tablet Take 50 mg by mouth every 6 (six) hours as needed. For pain      . Omega-3 Fatty Acids (FISH OIL PO) Take 1 capsule by mouth daily.        Physical Examination  Filed Vitals:   02/28/12 1555  BP: 121/71  Pulse: 66  Resp:     Body mass index is 27.40 kg/(m^2).  General:  WDWN in NAD Gait: Normal HEENT: WNL Eyes: Pupils equal Pulmonary: normal non-labored breathing , without Rales, rhonchi,  wheezing Cardiac: RRR, without  Murmurs, rubs or gallops; Abdomen: soft, NT, no masses Skin: no rashes, ulcers  noted  Vascular Exam Pulses: 3+ radial pulses Carotid bruits: Mild carotid bruit heard on the right, carotid pulse on the left Extremities without ischemic changes, no Gangrene , no cellulitis; no open wounds;  Musculoskeletal: no muscle wasting or atrophy   Neurologic: A&O X 3; Appropriate Affect ; SENSATION: normal; MOTOR FUNCTION:  moving all extremities equally. Speech is fluent/normal  Non-Invasive Vascular Imaging CAROTID DUPLEX 02/28/2012  Right ICA 60 - 79 % stenosis Left ICA 0 - 19% stenosis, slight increase in right ICA stenosis compared to previous exam   ASSESSMENT/PLAN: Asymptomatic patient with some increase in right ICA stenosis. The patient knows the signs and symptoms of CVA and knows to report to the nearest emergency department should that occur. The patient will followup here in 6 months for repeat carotid duplex. The patient's questions were encouraged and answered, she is in agreement with this plan.  Lauree Chandler ANP  Clinic MD: Early

## 2012-02-29 NOTE — Addendum Note (Signed)
Addended by: Sharee Pimple on: 02/29/2012 09:04 AM   Modules accepted: Orders

## 2012-05-24 DIAGNOSIS — M84376A Stress fracture, unspecified foot, initial encounter for fracture: Secondary | ICD-10-CM | POA: Insufficient documentation

## 2012-05-24 DIAGNOSIS — M2040 Other hammer toe(s) (acquired), unspecified foot: Secondary | ICD-10-CM | POA: Insufficient documentation

## 2012-05-24 DIAGNOSIS — Z96659 Presence of unspecified artificial knee joint: Secondary | ICD-10-CM | POA: Insufficient documentation

## 2012-08-27 ENCOUNTER — Encounter: Payer: Self-pay | Admitting: Neurosurgery

## 2012-08-28 ENCOUNTER — Ambulatory Visit (INDEPENDENT_AMBULATORY_CARE_PROVIDER_SITE_OTHER): Payer: Medicare Other | Admitting: Neurosurgery

## 2012-08-28 ENCOUNTER — Other Ambulatory Visit: Payer: Self-pay | Admitting: *Deleted

## 2012-08-28 ENCOUNTER — Encounter: Payer: Self-pay | Admitting: Neurosurgery

## 2012-08-28 ENCOUNTER — Other Ambulatory Visit (INDEPENDENT_AMBULATORY_CARE_PROVIDER_SITE_OTHER): Payer: Medicare Other | Admitting: *Deleted

## 2012-08-28 DIAGNOSIS — Z48812 Encounter for surgical aftercare following surgery on the circulatory system: Secondary | ICD-10-CM

## 2012-08-28 DIAGNOSIS — I6529 Occlusion and stenosis of unspecified carotid artery: Secondary | ICD-10-CM

## 2012-08-28 DIAGNOSIS — M79604 Pain in right leg: Secondary | ICD-10-CM

## 2012-08-28 NOTE — Progress Notes (Signed)
[facility] [place] Carotid Office Note  CC: Carotid surveillance Referring Physician: Early  History of Present Illness: 78 year old female patient of Dr. Arbie Cookey status post left CEA in January 2012. The patient denies any signs or symptoms of CVA, TIA, amaurosis fugax or any neural deficit. The patient does have complaints of lower extremity pain bilaterally however she has undergone a left knee arthroplasty and is planning on the right knee arthroplasty at [facility] in the near future which may be contributing to most of her pain.  Past Medical History  Diagnosis Date  . Allergy   . Diabetes mellitus     DIET CONTROLLED, NO MEDS  . GERD (gastroesophageal reflux disease)   . Hyperlipidemia   . Hypertension   . Neuromuscular disorder     nerve problems with hands  . Arthritis     OA  . Disc     problems in neck and back  . Sleep apnea   . Carotid artery occlusion     ROS: [x]  Positive   [ ]  Denies    General: [ ]  Weight loss, [ ]  Fever, [ ]  chills Neurologic: [ ]  Dizziness, [ ]  Blackouts, [ ]  Seizure [ ]  Stroke, [ ]  "Mini stroke", [ ]  Slurred speech, [ ]  Temporary blindness; [ ]  weakness in arms or legs, [ ]  Hoarseness Cardiac: [ ]  Chest pain/pressure, [ ]  Shortness of breath at rest [ ]  Shortness of breath with exertion, [ ]  Atrial fibrillation or irregular heartbeat Vascular: [ ]  Pain in legs with walking, [ ]  Pain in legs at rest, [ ]  Pain in legs at night,  [ ]  Non-healing ulcer, [ ]  Blood clot in vein/DVT,   Pulmonary: [ ]  Home oxygen, [ ]  Productive cough, [ ]  Coughing up blood, [ ]  Asthma,  [ ]  Wheezing Musculoskeletal:  [ ]  Arthritis, [ ]  Low back pain, [ ]  Joint pain Hematologic: [ ]  Easy Bruising, [ ]  Anemia; [ ]  Hepatitis Gastrointestinal: [ ]  Blood in stool, [ ]  Gastroesophageal Reflux/heartburn, [ ]  Trouble swallowing Urinary: [ ]  chronic Kidney disease, [ ]  on HD - [ ]  MWF or [ ]  TTHS, [ ]  Burning with urination, [ ]  Difficulty  urinating Skin: [ ]  Rashes, [ ]  Wounds Psychological: [ ]  Anxiety, [ ]  Depression   Social History History  Substance Use Topics  . Smoking status: Never Smoker   . Smokeless tobacco: Never Used  . Alcohol Use: 4.2 oz/week    7 Glasses of wine per week    Family History Family History  Problem Relation Age of Onset  . Colon cancer Maternal Grandmother   . Cancer Maternal Grandmother     Colon cancer  . Esophageal cancer Neg Hx   . Stomach cancer Neg Hx   . Rectal cancer Neg Hx   . Diabetes Father     Allergies  Allergen Reactions  . Morphine And Related     nausea  . Sulfa Antibiotics     "deathly ill"    Current Outpatient Prescriptions  Medication Sig Dispense Refill  . aspirin 81 MG tablet Take 81 mg by mouth daily.      . Calcium Carbonate-Vitamin D (CALCIUM PLUS VITAMIN D PO) Take 500 mg by mouth 3 (three) times daily.      . carvedilol (COREG) 6.25 MG tablet Take 6.25 mg by mouth Twice daily.       . CYMBALTA 60 MG capsule Take 1 tablet by mouth Daily.       Marland Kitchen  desoximetasone (TOPICORT) 0.25 % cream Apply topically continuous as needed.      . hydrochlorothiazide (HYDRODIURIL) 25 MG tablet Take 1 tablet by mouth Daily.       . multivitamin-lutein (OCUVITE-LUTEIN) CAPS Take 1 capsule by mouth daily.      Marland Kitchen OVER THE COUNTER MEDICATION Place 1 drop into both eyes at bedtime. dry eyes med      . ramipril (ALTACE) 10 MG capsule Take 10 mg by mouth Daily.       . simvastatin (ZOCOR) 10 MG tablet Take 10 mg by mouth Daily.       . traMADol (ULTRAM) 50 MG tablet Take 50 mg by mouth every 6 (six) hours as needed. For pain      . Multiple Vitamins-Minerals (MULTIVITAMIN PO) Take 1 tablet by mouth daily.       . Omega-3 Fatty Acids (FISH OIL PO) Take 1 capsule by mouth daily.       No current facility-administered medications for this visit.    Physical Examination  Filed Vitals:   08/28/12 1606  BP: 139/74  Pulse:     Body mass index is 28.5  kg/(m^2).  General:  WDWN in NAD Gait: Normal HEENT: WNL Eyes: Pupils equal Pulmonary: normal non-labored breathing , without Rales, rhonchi,  wheezing Cardiac: RRR, without  Murmurs, rubs or gallops; Abdomen: soft, NT, no masses Skin: no rashes, ulcers noted  Vascular Exam Pulses: 3+ radial pulses bilaterally, palpable femoral pulses bilaterally Carotid bruits: Carotid pulses to auscultation no bruits are heard Extremities without ischemic changes, no Gangrene , no cellulitis; no open wounds;  Musculoskeletal: no muscle wasting or atrophy   Neurologic: A&O X 3; Appropriate Affect ; SENSATION: normal; MOTOR FUNCTION:  moving all extremities equally. Speech is fluent/normal  Non-Invasive Vascular Imaging CAROTID DUPLEX 08/28/2012  Right ICA 40 - 59 % stenosis Left ICA 0 - 19% stenosis   ASSESSMENT/PLAN: Asymptomatic patient that will followup in 6 months with repeat carotid duplex even though she has been downgraded on the right from 60-79%, we will also add ABIs at that visit to check her blood flow due to  lower extremity pain. The patient's questions were encouraged and answered, she is in agreement with this plan.  Lauree Chandler ANP   Clinic MD: [facility] [place]

## 2012-08-30 ENCOUNTER — Other Ambulatory Visit: Payer: Self-pay | Admitting: *Deleted

## 2012-08-30 DIAGNOSIS — I739 Peripheral vascular disease, unspecified: Secondary | ICD-10-CM

## 2012-08-30 DIAGNOSIS — M79609 Pain in unspecified limb: Secondary | ICD-10-CM

## 2012-09-04 ENCOUNTER — Encounter (INDEPENDENT_AMBULATORY_CARE_PROVIDER_SITE_OTHER): Payer: Medicare Other | Admitting: *Deleted

## 2012-09-04 DIAGNOSIS — M79609 Pain in unspecified limb: Secondary | ICD-10-CM

## 2012-09-04 DIAGNOSIS — I739 Peripheral vascular disease, unspecified: Secondary | ICD-10-CM

## 2012-09-12 ENCOUNTER — Encounter: Payer: Self-pay | Admitting: Gastroenterology

## 2012-09-12 ENCOUNTER — Ambulatory Visit (INDEPENDENT_AMBULATORY_CARE_PROVIDER_SITE_OTHER): Payer: Medicare Other | Admitting: Gastroenterology

## 2012-09-12 VITALS — BP 128/72 | HR 72 | Ht 64.0 in | Wt 168.2 lb

## 2012-09-12 DIAGNOSIS — Z860100 Personal history of colon polyps, unspecified: Secondary | ICD-10-CM | POA: Insufficient documentation

## 2012-09-12 DIAGNOSIS — K625 Hemorrhage of anus and rectum: Secondary | ICD-10-CM | POA: Insufficient documentation

## 2012-09-12 MED ORDER — HYDROCORTISONE ACETATE 25 MG RE SUPP: 25.0000 mg | Freq: Two times a day (BID) | RECTAL | Status: DC

## 2012-09-12 NOTE — Assessment & Plan Note (Signed)
Plan follow-up colonoscopy 2016 

## 2012-09-12 NOTE — Progress Notes (Signed)
History of Present Illness:  The patient has returned for evaluation of rectal bleeding. On multiple occasions she is seeing spots of blood on the toilet tissue or staining her underclothes. At night she may have some leakage of stool as well. Since starting Kegel exercises for urinary incontinence bleeding and leakage has stopped. Colonoscopy 1 year ago demonstrated multiple  colon polyps which were removed.    Review of Systems: Pertinent positive and negative review of systems were noted in the above HPI section. All other review of systems were otherwise negative.    Current Medications, Allergies, Past Medical History, Past Surgical History, Family History and Social History were reviewed in [employer] electronic medical record  Vital signs were reviewed in today's medical record. Physical Exam: General: Well developed , well nourished, no acute distress Skin: anicteric Head: Normocephalic and atraumatic Eyes:  sclerae anicteric, EOMI Ears: Normal auditory acuity Mouth: No deformity or lesions Lungs: Clear throughout to auscultation Heart: Regular rate and rhythm; no murmurs, rubs or bruits Abdomen: Soft, non tender and non distended. No masses, hepatosplenomegaly or hernias noted. Normal Bowel sounds Rectal: There is a small skin tag Musculoskeletal: Symmetrical with no gross deformities  Pulses:  Normal pulses noted Extremities: No clubbing, cyanosis, edema or deformities noted Neurological: Alert oriented x 4, grossly nonfocal Psychological:  Alert and cooperative. Normal mood and affect

## 2012-09-12 NOTE — Assessment & Plan Note (Addendum)
Patient has had very limited rectal bleeding that most likely is hemorrhoidal.  Recommendations #1 Anusol HC suppositories

## 2012-09-12 NOTE — Patient Instructions (Addendum)

## 2012-09-21 DIAGNOSIS — M79673 Pain in unspecified foot: Secondary | ICD-10-CM | POA: Insufficient documentation

## 2012-09-24 ENCOUNTER — Telehealth: Payer: Self-pay | Admitting: Gastroenterology

## 2012-09-24 NOTE — Telephone Encounter (Signed)
I suspect this is hemorrhoidal bleeding. She should stop the suppositories and use warm soaks. If this continues to be a problem ask her to call back.

## 2012-09-24 NOTE — Telephone Encounter (Signed)
Pt states she saw Dr. Arlyce Dice the other day for rectal bleeding. States she was given hydrocortisone suppositories and that they have not helped, states the bleeding actually got worse with the suppositories. Pt states she had to wear a pad due to the bleeding. Dr. Arlyce Dice please advise.

## 2012-09-24 NOTE — Telephone Encounter (Signed)
Spoke with pt and she is aware.

## 2012-10-23 ENCOUNTER — Telehealth: Payer: Self-pay | Admitting: Gastroenterology

## 2012-10-23 NOTE — Telephone Encounter (Signed)
Pt states she is still having problems with anal leakage and occasional rectal bleeding. Pt scheduled to see Dr. Arlyce Dice 11/05/12@10 :30am. Pt aware of appt date and time.

## 2012-11-05 ENCOUNTER — Ambulatory Visit: Payer: Medicare Other | Admitting: Gastroenterology

## 2012-12-08 DIAGNOSIS — M1990 Unspecified osteoarthritis, unspecified site: Secondary | ICD-10-CM | POA: Insufficient documentation

## 2012-12-08 DIAGNOSIS — M5412 Radiculopathy, cervical region: Secondary | ICD-10-CM | POA: Insufficient documentation

## 2012-12-08 DIAGNOSIS — M4802 Spinal stenosis, cervical region: Secondary | ICD-10-CM | POA: Insufficient documentation

## 2012-12-08 DIAGNOSIS — E1169 Type 2 diabetes mellitus with other specified complication: Secondary | ICD-10-CM | POA: Insufficient documentation

## 2012-12-08 DIAGNOSIS — E785 Hyperlipidemia, unspecified: Secondary | ICD-10-CM | POA: Insufficient documentation

## 2012-12-08 DIAGNOSIS — G5600 Carpal tunnel syndrome, unspecified upper limb: Secondary | ICD-10-CM | POA: Insufficient documentation

## 2013-02-26 ENCOUNTER — Other Ambulatory Visit: Payer: Medicare Other

## 2013-02-26 ENCOUNTER — Ambulatory Visit: Payer: Medicare Other | Admitting: Neurosurgery

## 2013-03-01 ENCOUNTER — Ambulatory Visit: Payer: Medicare Other | Admitting: Family

## 2013-03-01 ENCOUNTER — Other Ambulatory Visit (INDEPENDENT_AMBULATORY_CARE_PROVIDER_SITE_OTHER): Payer: Medicare Other | Admitting: *Deleted

## 2013-03-01 DIAGNOSIS — I6529 Occlusion and stenosis of unspecified carotid artery: Secondary | ICD-10-CM

## 2013-03-01 DIAGNOSIS — Z48812 Encounter for surgical aftercare following surgery on the circulatory system: Secondary | ICD-10-CM

## 2013-03-08 ENCOUNTER — Other Ambulatory Visit: Payer: Self-pay | Admitting: *Deleted

## 2013-03-08 DIAGNOSIS — Z48812 Encounter for surgical aftercare following surgery on the circulatory system: Secondary | ICD-10-CM

## 2013-03-11 ENCOUNTER — Encounter: Payer: Self-pay | Admitting: Vascular Surgery

## 2013-05-13 ENCOUNTER — Other Ambulatory Visit: Payer: Self-pay | Admitting: Dermatology

## 2013-07-22 ENCOUNTER — Other Ambulatory Visit: Payer: Self-pay | Admitting: Internal Medicine

## 2013-07-22 DIAGNOSIS — E049 Nontoxic goiter, unspecified: Secondary | ICD-10-CM

## 2013-07-23 ENCOUNTER — Other Ambulatory Visit: Payer: Medicare Other

## 2013-07-26 ENCOUNTER — Ambulatory Visit
Admission: RE | Admit: 2013-07-26 | Discharge: 2013-07-26 | Disposition: A | Discharge status: Home / Self Care | Payer: Medicare Other | Source: Ambulatory Visit | Attending: Internal Medicine | Admitting: Internal Medicine

## 2013-07-26 DIAGNOSIS — E049 Nontoxic goiter, unspecified: Secondary | ICD-10-CM

## 2013-08-23 ENCOUNTER — Other Ambulatory Visit: Payer: Self-pay | Admitting: Vascular Surgery

## 2013-08-23 DIAGNOSIS — I6529 Occlusion and stenosis of unspecified carotid artery: Secondary | ICD-10-CM

## 2013-08-23 DIAGNOSIS — Z48812 Encounter for surgical aftercare following surgery on the circulatory system: Secondary | ICD-10-CM

## 2014-02-25 IMAGING — US US SOFT TISSUE HEAD/NECK
1 series · 14 of 25 positions shown · non-contrast
Comparison: CT scan of the neck dated 07/15/2010

CLINICAL DATA: Multiple thyroid nodules.

THYROID ULTRASOUND
TECHNIQUE: Ultrasound examination of the thyroid gland and adjacent
soft tissues was performed.

[Series 1: us soft tissue head/neck · 0.10mm/px · 14 of 85 slices shown]
[im 1/85]
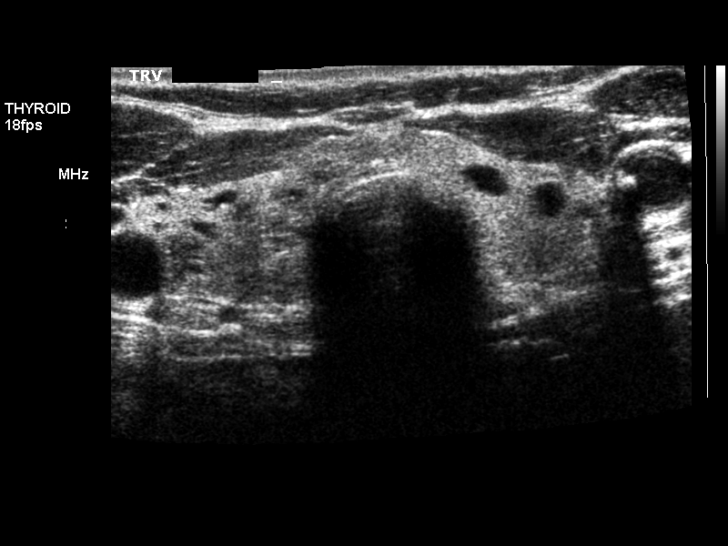
[im 8/85]
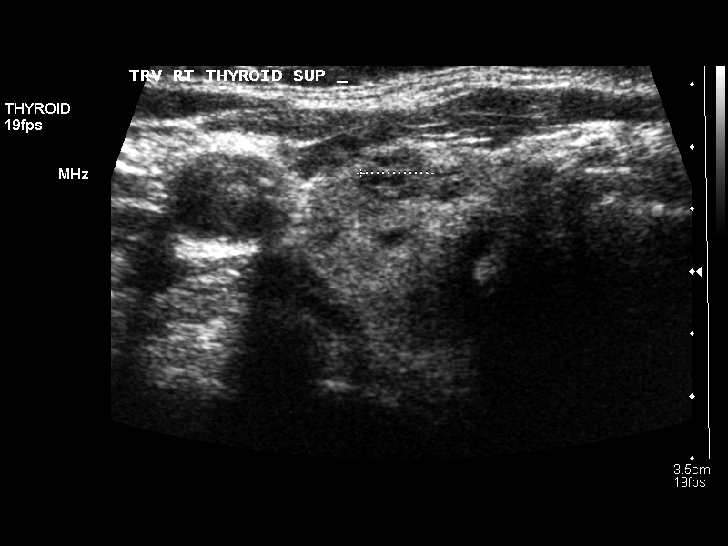
[im 15/85]
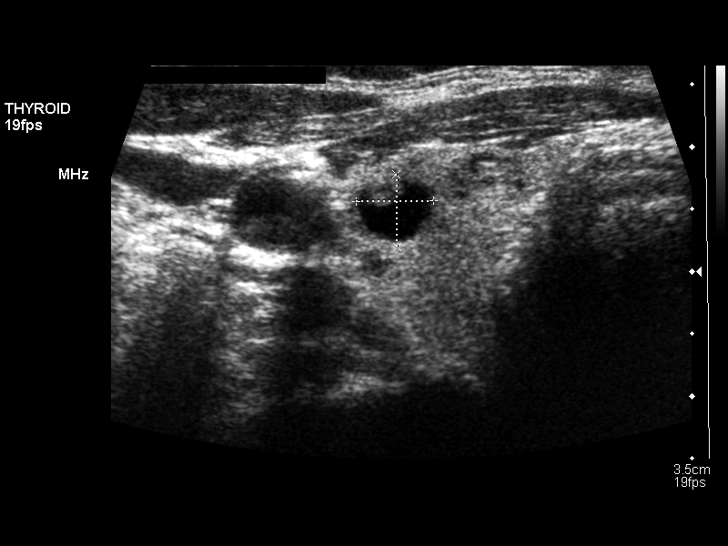
[im 22/85]
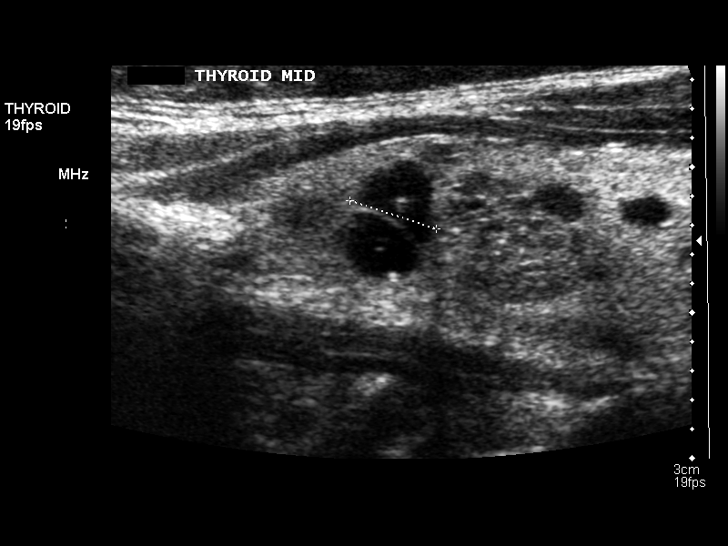
[im 29/85]
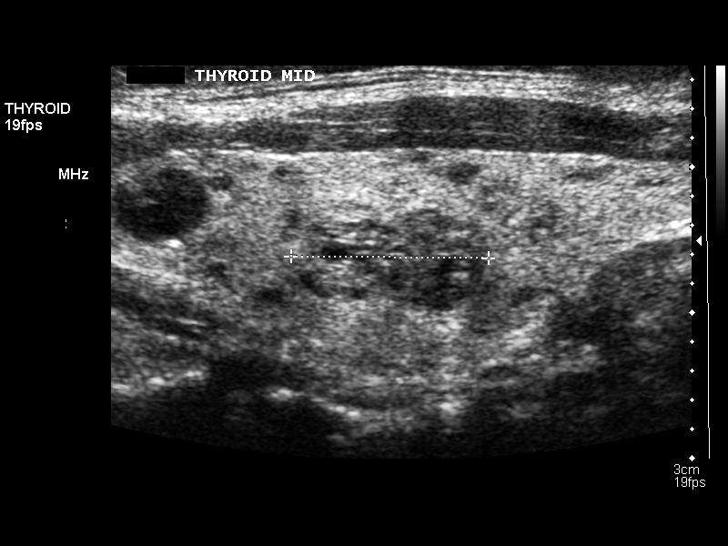
[im 32/85]
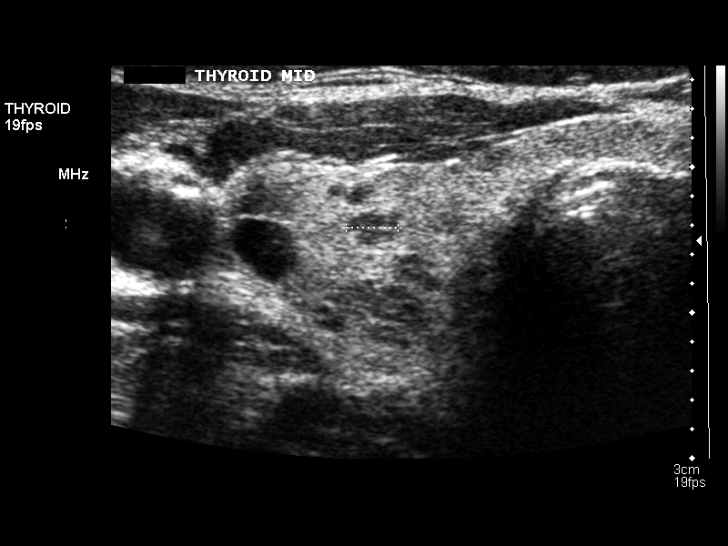
[im 39/85]
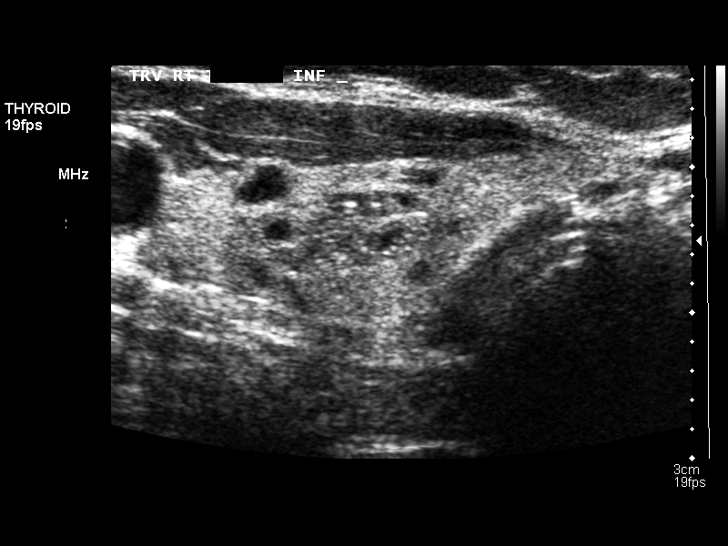
[im 46/85]
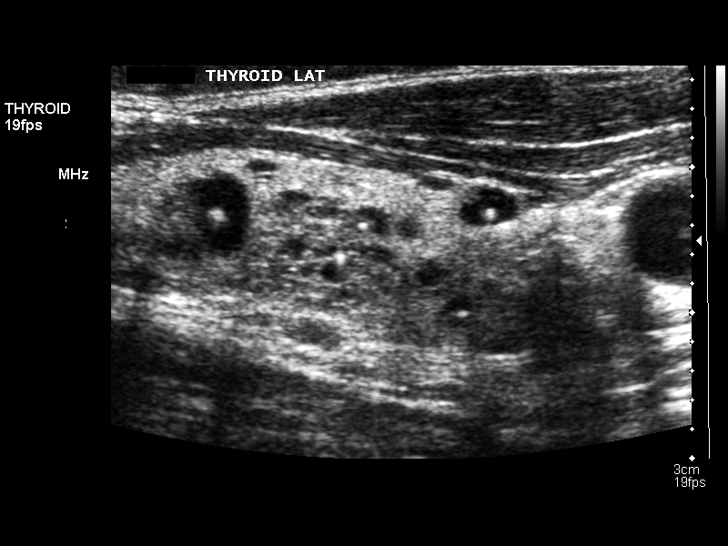
[im 53/85]
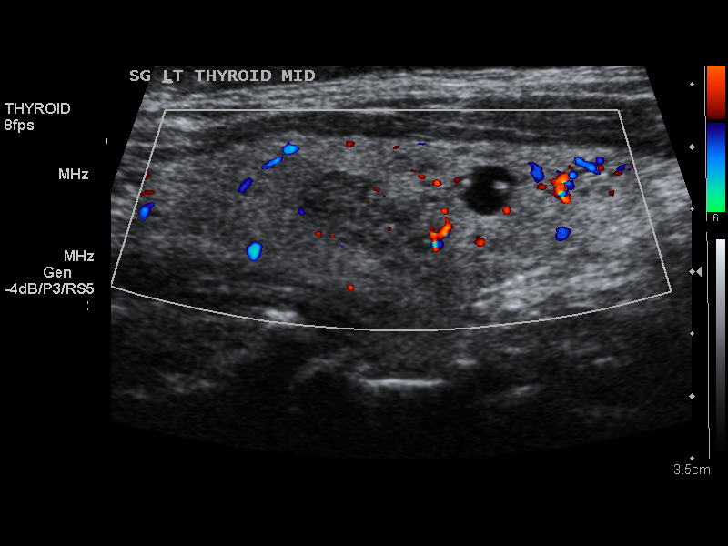
[im 57/85]
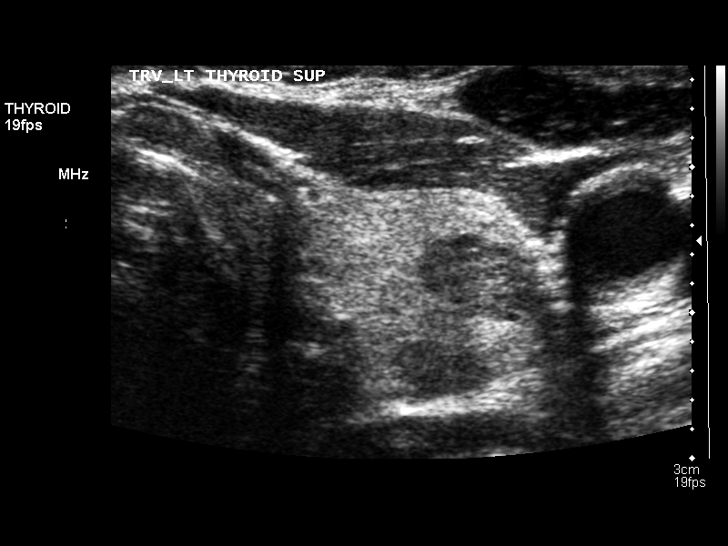
[im 64/85]
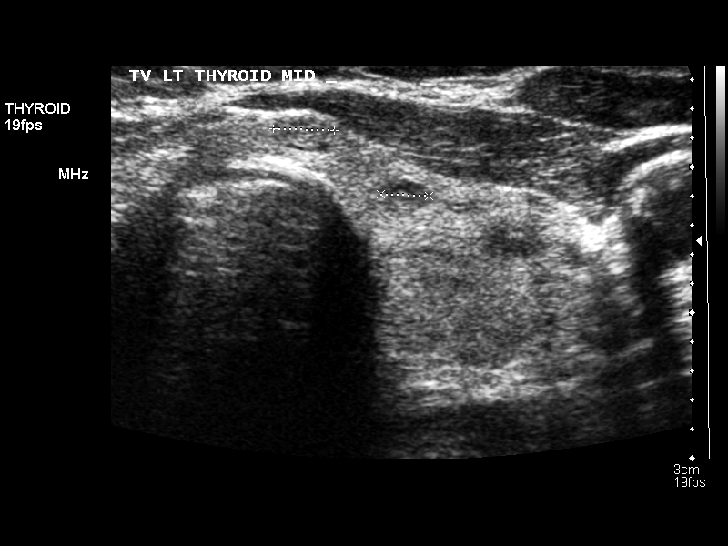
[im 71/85]
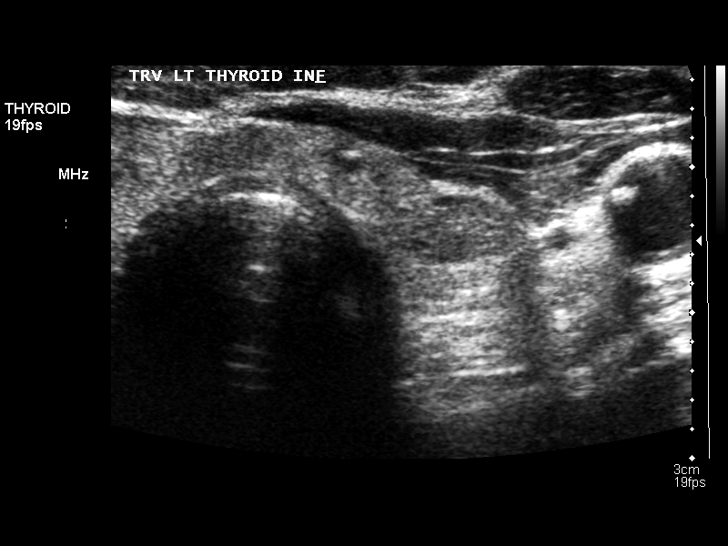
[im 78/85]
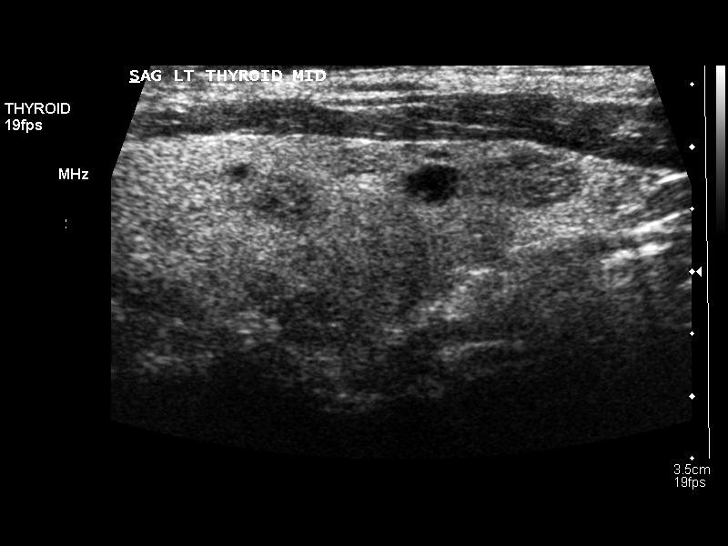
[im 85/85]
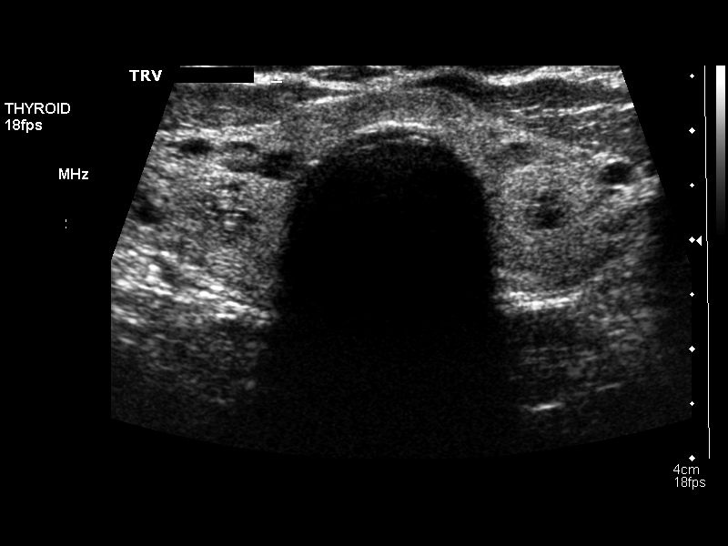

[14 of 25 positions shown; findings below may reference images not displayed]

FINDINGS: Right thyroid lobe:  5.0 x 2.4 x 2.0 cm.
Left thyroid lobe:  4.4 x 1.6 x 2.0 cm.
Isthmus:  0.4 cm.

Focal nodules:  There are numerous inhomogeneous nodules throughout
both lobes of the thyroid gland, most of which are cystic with tiny
echogenic foci consistent with colloid cysts.

 There are a few primarily solid nodules including a 1.3 x 1.2 x
1.4 cm nodule in the mid right lobe and a 1.0 x 0.5 x 0.9 cm nodule
in the right lower lobe.

  There are small nodules in the left upper and lower poles which
are solid, including  1.0 x 0.6 x 1.1 cm in the upper pole and
x 0.5 x 0.9 cm in the lower pole.

Lymphadenopathy:  None visualized.
IMPRESSION: Multi nodular goiter.  None of the nodules fit criteria for biopsy
at this time.

## 2014-03-03 ENCOUNTER — Encounter: Payer: Self-pay | Admitting: Family

## 2014-03-04 ENCOUNTER — Encounter: Payer: Self-pay | Admitting: Family

## 2014-03-04 ENCOUNTER — Ambulatory Visit (INDEPENDENT_AMBULATORY_CARE_PROVIDER_SITE_OTHER): Payer: Medicare Other | Admitting: Family

## 2014-03-04 ENCOUNTER — Ambulatory Visit (HOSPITAL_COMMUNITY)
Admission: RE | Admit: 2014-03-04 | Discharge: 2014-03-04 | Disposition: A | Discharge status: Home / Self Care | Payer: Medicare Other | Source: Ambulatory Visit | Attending: Family | Admitting: Family

## 2014-03-04 VITALS — BP 146/77 | HR 68 | Resp 16 | Ht 64.0 in | Wt 165.0 lb

## 2014-03-04 DIAGNOSIS — I6529 Occlusion and stenosis of unspecified carotid artery: Secondary | ICD-10-CM

## 2014-03-04 DIAGNOSIS — R531 Weakness: Secondary | ICD-10-CM | POA: Insufficient documentation

## 2014-03-04 DIAGNOSIS — R200 Anesthesia of skin: Secondary | ICD-10-CM | POA: Insufficient documentation

## 2014-03-04 DIAGNOSIS — E785 Hyperlipidemia, unspecified: Secondary | ICD-10-CM | POA: Insufficient documentation

## 2014-03-04 DIAGNOSIS — Z7982 Long term (current) use of aspirin: Secondary | ICD-10-CM | POA: Insufficient documentation

## 2014-03-04 DIAGNOSIS — R5381 Other malaise: Secondary | ICD-10-CM

## 2014-03-04 DIAGNOSIS — Z9889 Other specified postprocedural states: Secondary | ICD-10-CM | POA: Insufficient documentation

## 2014-03-04 DIAGNOSIS — I10 Essential (primary) hypertension: Secondary | ICD-10-CM | POA: Diagnosis not present

## 2014-03-04 DIAGNOSIS — R209 Unspecified disturbances of skin sensation: Secondary | ICD-10-CM

## 2014-03-04 DIAGNOSIS — Z48812 Encounter for surgical aftercare following surgery on the circulatory system: Secondary | ICD-10-CM

## 2014-03-04 DIAGNOSIS — M79609 Pain in unspecified limb: Secondary | ICD-10-CM

## 2014-03-04 DIAGNOSIS — R5383 Other fatigue: Secondary | ICD-10-CM

## 2014-03-04 DIAGNOSIS — M79661 Pain in right lower leg: Secondary | ICD-10-CM | POA: Insufficient documentation

## 2014-03-04 DIAGNOSIS — R202 Paresthesia of skin: Secondary | ICD-10-CM

## 2014-03-04 NOTE — Patient Instructions (Signed)
Stroke Prevention Some medical conditions and behaviors are associated with an increased chance of having a stroke. You may prevent a stroke by making healthy choices and managing medical conditions. HOW CAN I REDUCE MY RISK OF HAVING A STROKE?   Stay physically active. Get at least 30 minutes of activity on most or all days.  Do not smoke. It may also be helpful to avoid exposure to secondhand smoke.  Limit alcohol use. Moderate alcohol use is considered to be:  No more than 2 drinks per day for men.  No more than 1 drink per day for nonpregnant women.  Eat healthy foods. This involves:  Eating 5 or more servings of fruits and vegetables a day.  Making dietary changes that address high blood pressure (hypertension), high cholesterol, diabetes, or obesity.  Manage your cholesterol levels.  Making food choices that are high in fiber and low in saturated fat, trans fat, and cholesterol may control cholesterol levels.  Take any prescribed medicines to control cholesterol as directed by your health care provider.  Manage your diabetes.  Controlling your carbohydrate and sugar intake is recommended to manage diabetes.  Take any prescribed medicines to control diabetes as directed by your health care provider.  Control your hypertension.  Making food choices that are low in salt (sodium), saturated fat, trans fat, and cholesterol is recommended to manage hypertension.  Take any prescribed medicines to control hypertension as directed by your health care provider.  Maintain a healthy weight.  Reducing calorie intake and making food choices that are low in sodium, saturated fat, trans fat, and cholesterol are recommended to manage weight.  Stop drug abuse.  Avoid taking birth control pills.  Talk to your health care provider about the risks of taking birth control pills if you are over 35 years old, smoke, get migraines, or have ever had a blood clot.  Get evaluated for sleep  disorders (sleep apnea).  Talk to your health care provider about getting a sleep evaluation if you snore a lot or have excessive sleepiness.  Take medicines only as directed by your health care provider.  For some people, aspirin or blood thinners (anticoagulants) are helpful in reducing the risk of forming abnormal blood clots that can lead to stroke. If you have the irregular heart rhythm of atrial fibrillation, you should be on a blood thinner unless there is a good reason you cannot take them.  Understand all your medicine instructions.  Make sure that other conditions (such as anemia or atherosclerosis) are addressed. SEEK IMMEDIATE MEDICAL CARE IF:   You have sudden weakness or numbness of the face, arm, or leg, especially on one side of the body.  Your face or eyelid droops to one side.  You have sudden confusion.  You have trouble speaking (aphasia) or understanding.  You have sudden trouble seeing in one or both eyes.  You have sudden trouble walking.  You have dizziness.  You have a loss of balance or coordination.  You have a sudden, severe headache with no known cause.  You have new chest pain or an irregular heartbeat. Any of these symptoms may represent a serious problem that is an emergency. Do not wait to see if the symptoms will go away. Get medical help at once. Call your local emergency services (911 in [place] Do not drive yourself to the hospital. Document Released: 07/28/2004 Document Revised: 11/04/2013 Document Reviewed: 12/21/2012 [employer] Patient Information 2015 [employer]. This information is not intended to replace advice given   to you by your health care provider. Make sure you discuss any questions you have with your health care provider.  

## 2014-03-04 NOTE — Progress Notes (Signed)
Established Carotid Patient   History of Present Illness  Sara Holt is a 79 y.o. female patient of Dr. Donnetta Hutching who is status post left CEA in January 2012. She returns today for follow up. About 2 weeks ago she had a very brief syncopal episode after standing up and walking for a while, has had no other syncopal episodes. In 2012, just before the left CEA, she had transient left monocular blindness, denies unilateral facial drooping, denies hemiparesis, denies aphasia symptoms. Saw her opthalmologist after this.  Pt reports New Medical or Surgical History: recently started on metformin and newly diagnosed stage 3 CRF. Walks on her treadmill at least 15-20 minutes about 6 days/week. She has arthritis, including her right knee.  She denies any cardiac problems, states her last stress test was a few years ago to evaluate left arm and neck pain, pt states was normal, states c-spine vertabra issue found.  Pt smoker: non-smoker  Pt meds include: Statin : Yes ASA: Yes Other anticoagulants/antiplatelets: no   Past Medical History  Diagnosis Date  . Allergy   . Diabetes mellitus     DIET CONTROLLED, NO MEDS  . GERD (gastroesophageal reflux disease)   . Hyperlipidemia   . Hypertension   . Neuromuscular disorder     nerve problems with hands  . Arthritis     OA  . Disc     problems in neck and back  . Sleep apnea   . Carotid artery occlusion     Social History History  Substance Use Topics  . Smoking status: Never Smoker   . Smokeless tobacco: Never Used  . Alcohol Use: 4.2 oz/week    7 Glasses of wine per week    Family History Family History  Problem Relation Age of Onset  . Colon cancer Maternal Grandmother   . Cancer Maternal Grandmother     Colon cancer  . Esophageal cancer Neg Hx   . Stomach cancer Neg Hx   . Rectal cancer Neg Hx   . Diabetes Father   . Pneumonia Father     Surgical History Past Surgical History  Procedure Laterality Date  .  Colonoscopy    . Polypectomy    . Carotid artery angioplasty    . Carpel tunnel      both hands  . Carotid endarterectomy  Jan. 16,2012    LEFT cea  . Knee arthroscopy Left 2010    Allergies  Allergen Reactions  . Morphine And Related Nausea And Vomiting    nausea  . Sulfa Antibiotics Nausea And Vomiting    "deathly ill"    Current Outpatient Prescriptions  Medication Sig Dispense Refill  . alendronate (FOSAMAX) 70 MG tablet Take 70 mg by mouth every 7 (seven) days. Take with a full glass of water on an empty stomach.      Marland Kitchen aspirin 81 MG tablet Take 81 mg by mouth daily.      . Calcium Carbonate-Vitamin D (CALCIUM PLUS VITAMIN D PO) Take 500 mg by mouth 3 (three) times daily.      . carvedilol (COREG) 6.25 MG tablet Take 6.25 mg by mouth Twice daily.       . CYMBALTA 60 MG capsule Take 1 tablet by mouth Daily.       Marland Kitchen desoximetasone (TOPICORT) 0.25 % cream Apply topically continuous as needed.      . hydrochlorothiazide (HYDRODIURIL) 25 MG tablet Take 1 tablet by mouth Daily.       . metFORMIN (GLUCOPHAGE)  500 MG tablet Take 500 mg by mouth daily with supper.      . multivitamin-lutein (OCUVITE-LUTEIN) CAPS Take 1 capsule by mouth daily.      Marland Kitchen OVER THE COUNTER MEDICATION Place 1 drop into both eyes at bedtime. dry eyes med      . ramipril (ALTACE) 10 MG capsule Take 20 mg by mouth daily.       . simvastatin (ZOCOR) 10 MG tablet Take 10 mg by mouth Daily.       . traMADol (ULTRAM) 50 MG tablet Take 50 mg by mouth every 6 (six) hours as needed. For pain      . hydrocortisone (ANUSOL-HC) 25 MG suppository Place 1 suppository (25 mg total) rectally every 12 (twelve) hours.  12 suppository  1   No current facility-administered medications for this visit.    Review of Systems : See HPI for pertinent positives and negatives.  Physical Examination  Filed Vitals:   03/04/14 1709 03/04/14 1712  BP: 132/74 146/77  Pulse: 67 68  Resp:  16  Height:  5\' 4"  (1.626 m)  Weight:  165  lb (74.844 kg)  SpO2:  99%   Body mass index is 28.31 kg/(m^2).  General: WDWN female in NAD GAIT: normal Eyes: PERRLA Pulmonary:  Non-labored, CTAB, Negative  Rales, Negative rhonchi, & Negative wheezing.  Cardiac: regular Rhythm ,  Positive detected murmur.  VASCULAR EXAM Carotid Bruits Right Left   Positive Positive     Radial pulses are 2+ palpable and equal.                                                                                                                       Gastrointestinal: soft, nontender, BS WNL, no r/g,  negative masses palpated.  Musculoskeletal: Negative muscle atrophy/wasting. M/S 5/5 throughout, Extremities without ischemic changes.  Neurologic: A&O X 3; Appropriate Affect ; SENSATION ;normal;  Speech is normal CN 2-12 intact  Except uvula deviation to the left with palate rise, Pain and light touch intact in extremities, Motor exam as listed above.   Non-Invasive Vascular Imaging CAROTID DUPLEX 03/04/2014   CEREBROVASCULAR DUPLEX EVALUATION    INDICATION: Carotid endarterectomy     PREVIOUS INTERVENTION(S): Left carotid endarterectomy on 07/19/10    DUPLEX EXAM:     RIGHT  LEFT  Peak Systolic Velocities (cm/s) End Diastolic Velocities (cm/s) Plaque LOCATION Peak Systolic Velocities (cm/s) End Diastolic Velocities (cm/s) Plaque  65 10  CCA PROXIMAL 95 18   95 14 CP CCA MID 124 24 HT  89 17 HT CCA DISTAL 82 6   164 16 HT ECA 316 22 HT  283 70 CP ICA PROXIMAL 93 26   160 26  ICA MID 87 22   87 23  ICA DISTAL 78 23     3.2 ICA / CCA Ratio (PSV) Not Calculated  Antegrade Vertebral Flow Antegrade  158 Brachial Systolic Pressure (mmHg) 309  Multiphasic (subclavian artery) Brachial Artery Waveforms Multiphasic (subclavian artery)  Plaque Morphology:  HM = Homogeneous, HT = Heterogeneous, CP = Calcific Plaque, SP = Smooth Plaque, IP = Irregular Plaque     ADDITIONAL FINDINGS:   No significant stenosis of the right external or bilateral  common carotid arteries.   Left external carotid artery stenosis noted.    IMPRESSION: 1. Doppler velocities suggest a 60-79% stenosis of the right proximal internal carotid artery. 2. Patent left carotid endarterectomy site with no left internal carotid artery stenosis.    Compared to the previous exam:  Increase of the right proximal internal carotid artery velocity noted when compared to the previous exam on 03/01/13 with the left internal carotid artery remaining stable.     Assessment: Sara Holt is a 79 y.o. female who is status post left CEA in January 2012.  About 2 weeks ago she had a very brief syncopal episode after standing up and walking for a while, has had no other syncopal episodes. In 2012, just before the left CEA, she had transient left monocular blindness, denies unilateral facial drooping, denies hemiparesis, denies aphasia symptoms. Carotid Duplex today demonstrates  60-79% stenosis of the right proximal internal carotid artery and patent left carotid endarterectomy site with no left internal carotid artery stenosis. Increase of the right proximal internal carotid artery velocity noted when compared to the previous exam on 03/01/13 with the left internal carotid artery remaining stable.     Plan: Follow-up in with Dr. Donnetta Hutching on Tuesday, Sept. 8, for discission re whether her syncope may be TIA related and whether right CEA should be considered or not since she has an increase in the right ICA stenosis to 60-79% compared to a year ago.   She was advised of the typical stroke and TIA symptoms and to call 911 should she have any of these symptoms.  I discussed in depth with the patient the nature of atherosclerosis, and emphasized the importance of maximal medical management including strict control of blood pressure, blood glucose, and lipid levels, obtaining regular exercise, and continued cessation of smoking.  The patient is aware that without maximal medical  management the underlying atherosclerotic disease process will progress, limiting the benefit of any interventions. The patient was given information about stroke prevention and what symptoms should prompt the patient to seek immediate medical care. Thank you for allowing [place] to participate in this patient's care.  Clemon Chambers, RN, MSN, FNP-C [facility] Office: [phone]  Clinic Physician: Kellie Simmering  03/04/2014 5:24 PM

## 2014-03-07 ENCOUNTER — Encounter: Payer: Self-pay | Admitting: Vascular Surgery

## 2014-03-11 ENCOUNTER — Encounter: Payer: Self-pay | Admitting: Vascular Surgery

## 2014-03-11 ENCOUNTER — Ambulatory Visit (INDEPENDENT_AMBULATORY_CARE_PROVIDER_SITE_OTHER): Payer: Medicare Other | Admitting: Vascular Surgery

## 2014-03-11 VITALS — BP 144/62 | HR 64 | Ht 64.0 in | Wt 163.6 lb

## 2014-03-11 DIAGNOSIS — I6529 Occlusion and stenosis of unspecified carotid artery: Secondary | ICD-10-CM

## 2014-03-11 NOTE — Addendum Note (Signed)
Addended by: Mena Goes on: 03/11/2014 05:26 PM   Modules accepted: Orders

## 2014-03-11 NOTE — Progress Notes (Signed)
Patient is today for discussion of recent carotid duplex suggesting some increased callosities on her right internal carotid artery. She is well-known to me from a prior left carotid endarterectomy for symptomatic carotid disease on [address] 2012th. She had left visual changes prior to her surgery and has had no difficulty since then. She did have an event several weeks ago where she had the syncopal episode and actually fell. This resolved after several minutes. She specifically denies any focal neurologic deficits. She's had no similar episodes since this time. She did not have any sensation of her heart racing or beating irregular. She specifically denies any focal weakness or speech disturbance.  Past Medical History  Diagnosis Date  . Allergy   . Diabetes mellitus     DIET CONTROLLED, NO MEDS  . GERD (gastroesophageal reflux disease)   . Hyperlipidemia   . Hypertension   . Neuromuscular disorder     nerve problems with hands  . Arthritis     OA  . Disc     problems in neck and back  . Sleep apnea   . Carotid artery occlusion     History  Substance Use Topics  . Smoking status: Never Smoker   . Smokeless tobacco: Never Used  . Alcohol Use: 4.2 oz/week    7 Glasses of wine per week    Family History  Problem Relation Age of Onset  . Colon cancer Maternal Grandmother   . Cancer Maternal Grandmother     Colon cancer  . Esophageal cancer Neg Hx   . Stomach cancer Neg Hx   . Rectal cancer Neg Hx   . Diabetes Father   . Pneumonia Father     Allergies  Allergen Reactions  . Morphine And Related Nausea And Vomiting    nausea  . Sulfa Antibiotics Nausea And Vomiting    "deathly ill"    Current outpatient prescriptions:alendronate (FOSAMAX) 70 MG tablet, Take 70 mg by mouth every 7 (seven) days. Take with a full glass of water on an empty stomach., Disp: , Rfl: ;  aspirin 81 MG tablet, Take 81 mg by mouth daily., Disp: , Rfl: ;  Calcium Carbonate-Vitamin D (CALCIUM PLUS VITAMIN D  PO), Take 500 mg by mouth 3 (three) times daily., Disp: , Rfl: ;  carvedilol (COREG) 6.25 MG tablet, Take 6.25 mg by mouth Twice daily. , Disp: , Rfl:  CYMBALTA 60 MG capsule, Take 1 tablet by mouth Daily. , Disp: , Rfl: ;  desoximetasone (TOPICORT) 0.25 % cream, Apply topically continuous as needed., Disp: , Rfl: ;  hydrochlorothiazide (HYDRODIURIL) 25 MG tablet, Take 1 tablet by mouth Daily. , Disp: , Rfl: ;  hydrocortisone (ANUSOL-HC) 25 MG suppository, Place 1 suppository (25 mg total) rectally every 12 (twelve) hours., Disp: 12 suppository, Rfl: 1 metFORMIN (GLUCOPHAGE) 500 MG tablet, Take 500 mg by mouth daily with supper., Disp: , Rfl: ;  multivitamin-lutein (OCUVITE-LUTEIN) CAPS, Take 1 capsule by mouth daily., Disp: , Rfl: ;  OVER THE COUNTER MEDICATION, Place 1 drop into both eyes at bedtime. dry eyes med, Disp: , Rfl: ;  ramipril (ALTACE) 10 MG capsule, Take 20 mg by mouth daily. , Disp: , Rfl: ;  simvastatin (ZOCOR) 10 MG tablet, Take 10 mg by mouth Daily. , Disp: , Rfl:  traMADol (ULTRAM) 50 MG tablet, Take 50 mg by mouth every 6 (six) hours as needed. For pain, Disp: , Rfl:   BP 144/62  Pulse 64  Ht 5\' 4"  (1.626 m)  Wt  163 lb 9.6 oz (74.208 kg)  BMI 28.07 kg/m2  SpO2 98%  Body mass index is 28.07 kg/(m^2).       Physical exam well-developed well-nourished white female appearing stated age no acute distress Her left carotid incision is well-healed and she hasn't no bruits bilaterally 2+ radial 2+ dorsalis pedis pulses bilaterally Respirations nonlabored Neurologic she is grossly intact  Did review her duplex from 03/04/2014. This showed widely patent left endarterectomy with no recurrent stenosis. She does have moderate to severe 60-70% stenosis the right internal carotid artery. This does show some progression since her prior imaging would be earlier.  Impression and plan asymptomatic moderate to severe right carotid stenosis. Discussed this at length with the patient and her  husband present. I do not feel this has any relationship to the syncopal episode that she had. I would recommend that we see her at 6 month intervals. She will notify us immediately should she develop any right hemispheric symptoms I did discuss this with the patient. We will see her again in 6 months for continued follow

## 2014-04-03 ENCOUNTER — Ambulatory Visit: Payer: Medicare Other | Admitting: Gastroenterology

## 2014-08-08 ENCOUNTER — Encounter: Payer: Self-pay | Admitting: Gastroenterology

## 2014-08-08 ENCOUNTER — Telehealth: Payer: Self-pay | Admitting: Gastroenterology

## 2014-08-20 ENCOUNTER — Telehealth: Payer: Self-pay

## 2014-08-20 NOTE — Telephone Encounter (Signed)
Able to reach pt. Per phone.  Appt. Given for 2/18 @ 12:30 PM for a Carotid Ultrasound, and office exam.  Agrees with plan.

## 2014-08-20 NOTE — Telephone Encounter (Signed)
Phone call from pt.  Reported she has had 3 episodes of passing out lately; reported one episode this morning, one episode last week, and one episode about 2 mos. Ago.  Stated she begins to feel funny, and then finds herself waking up on the floor.  Denies any unilateral weakness, dizziness, temp. vision loss, disorientation, difficulty with speech, or facial droop.  Reported no balance issues with exception of when she passes out.  Denies reporting the episodes to her PCP.  Encouraged to notify her PCP.  Discussed with Dr.Dickson.  Recommended to a carotid duplex as soon as possible, to rule out any relation to her syncopal episodes.  Called pt.  Left message regarding scheduling appt. For carotid ultrasound.

## 2014-08-21 ENCOUNTER — Encounter: Payer: Self-pay | Admitting: Family

## 2014-08-21 ENCOUNTER — Other Ambulatory Visit: Payer: Self-pay | Admitting: *Deleted

## 2014-08-21 ENCOUNTER — Ambulatory Visit (INDEPENDENT_AMBULATORY_CARE_PROVIDER_SITE_OTHER): Payer: Medicare Other | Admitting: Family

## 2014-08-21 ENCOUNTER — Ambulatory Visit (HOSPITAL_COMMUNITY)
Admission: RE | Admit: 2014-08-21 | Discharge: 2014-08-21 | Disposition: A | Discharge status: Home / Self Care | Payer: Medicare Other | Source: Ambulatory Visit | Attending: Family | Admitting: Family

## 2014-08-21 ENCOUNTER — Other Ambulatory Visit: Payer: Self-pay

## 2014-08-21 VITALS — BP 129/71 | HR 61 | Resp 16 | Ht 64.0 in | Wt 161.0 lb

## 2014-08-21 DIAGNOSIS — R55 Syncope and collapse: Secondary | ICD-10-CM

## 2014-08-21 DIAGNOSIS — I6523 Occlusion and stenosis of bilateral carotid arteries: Secondary | ICD-10-CM

## 2014-08-21 DIAGNOSIS — I6521 Occlusion and stenosis of right carotid artery: Secondary | ICD-10-CM

## 2014-08-21 DIAGNOSIS — Z9889 Other specified postprocedural states: Secondary | ICD-10-CM

## 2014-08-21 DIAGNOSIS — IMO0002 Reserved for concepts with insufficient information to code with codable children: Secondary | ICD-10-CM

## 2014-08-21 NOTE — Progress Notes (Signed)
Established Carotid Patient   History of Present Illness  Sara Holt is a 80 y.o. female patient of Dr. Donnetta Hutching who is status post left CEA in January 2012. She returns today for c/o more episodes of syncope last week, stood up and started up her stairs, and felt light headed, did not lose consciousness; and yesterday she was in the shower and found her self on the floor, does not recall if she lost consciousness.  Her ramipril dose was increased last month.  Pt states her blood pressure has never been as low as it is today. She has an appointment this afternoon to see the PA in her PCP's office. Pt states she has not seen a cardiologist since she has had syncopal episodes.  Dr. Donnetta Hutching saw pt on 03/11/14 and did not feel that her syncope was related to her carotid artery stenosis at that time. In August 2015 she had a very brief syncopal episode after standing up and walking for a while. In 2012, just before the left CEA, she had transient left monocular blindness, denies unilateral facial drooping, denies hemiparesis, denies aphasia symptoms. Saw her opthalmologist after this.  Pt reports New Medical or Surgical History: recently started on prilosec. Walks on her treadmill at least 15-20 minutes about 3 days/week. She has arthritis, including her right knee.  She denies any cardiac problems, states her last stress test was a few years ago to evaluate left arm and neck pain, pt states was normal, states c-spine vertabra issue found.  Pt smoker: non-smoker Pt states her DM is in good control.  Pt meds include: Statin : Yes ASA: Yes Other anticoagulants/antiplatelets: no  Past Medical History  Diagnosis Date  . Allergy   . Diabetes mellitus     DIET CONTROLLED, NO MEDS  . GERD (gastroesophageal reflux disease)   . Hyperlipidemia   . Hypertension   . Neuromuscular disorder     nerve problems with hands  . Arthritis     OA  . Disc     problems in neck and back  . Sleep  apnea   . Carotid artery occlusion     Social History History  Substance Use Topics  . Smoking status: Never Smoker   . Smokeless tobacco: Never Used  . Alcohol Use: 4.2 oz/week    7 Glasses of wine per week    Family History Family History  Problem Relation Age of Onset  . Colon cancer Maternal Grandmother   . Cancer Maternal Grandmother     Colon cancer  . Esophageal cancer Neg Hx   . Stomach cancer Neg Hx   . Rectal cancer Neg Hx   . Diabetes Father   . Pneumonia Father     Surgical History Past Surgical History  Procedure Laterality Date  . Colonoscopy    . Polypectomy    . Carotid artery angioplasty    . Carpel tunnel      both hands  . Carotid endarterectomy  Jan. 16,2012    LEFT cea  . Knee arthroscopy Left 2010    Allergies  Allergen Reactions  . Morphine And Related Nausea And Vomiting    nausea  . Sulfa Antibiotics Nausea And Vomiting    "deathly ill"    Current Outpatient Prescriptions  Medication Sig Dispense Refill  . alendronate (FOSAMAX) 70 MG tablet Take 70 mg by mouth every 7 (seven) days. Take with a full glass of water on an empty stomach.    Marland Kitchen aspirin 81 MG  tablet Take 81 mg by mouth daily.    . Calcium Carbonate-Vitamin D (CALCIUM PLUS VITAMIN D PO) Take 500 mg by mouth 3 (three) times daily.    . carvedilol (COREG) 6.25 MG tablet Take 6.25 mg by mouth Twice daily.     . CYMBALTA 60 MG capsule Take 1 tablet by mouth Daily.     Marland Kitchen desoximetasone (TOPICORT) 0.25 % cream Apply topically continuous as needed.    . hydrochlorothiazide (HYDRODIURIL) 25 MG tablet Take 1 tablet by mouth Daily.     . hydrocortisone (ANUSOL-HC) 25 MG suppository Place 1 suppository (25 mg total) rectally every 12 (twelve) hours. 12 suppository 1  . multivitamin-lutein (OCUVITE-LUTEIN) CAPS Take 1 capsule by mouth daily.    . ONE TOUCH ULTRA TEST test strip   0  . OVER THE COUNTER MEDICATION Place 1 drop into both eyes at bedtime. dry eyes med    . ramipril  (ALTACE) 10 MG capsule Take 20 mg by mouth 2 (two) times daily.     . simvastatin (ZOCOR) 10 MG tablet Take 10 mg by mouth Daily.     . sitaGLIPtin-metformin (JANUMET) 50-500 MG per tablet Take 1 tablet by mouth at bedtime.    . traMADol (ULTRAM) 50 MG tablet Take 50 mg by mouth every 6 (six) hours as needed. For pain    . metFORMIN (GLUCOPHAGE) 500 MG tablet Take 500 mg by mouth daily with supper.     No current facility-administered medications for this visit.    Review of Systems : See HPI for pertinent positives and negatives.  Physical Examination  Filed Vitals:   08/21/14 1334 08/21/14 1337  BP: 104/64 119/66  Pulse: 63 61  Resp:  16  Height:  5\' 4"  (1.626 m)  Weight:  161 lb (73.029 kg)  SpO2:  100%   Body mass index is 27.62 kg/(m^2).   General: WDWN female in NAD GAIT: normal Eyes: PERRLA Pulmonary: Non-labored, CTAB, Negative Rales, Negative rhonchi, & Negative wheezing.  Cardiac: regular Rhythm , Positive detected murmur.  VASCULAR EXAM Carotid Bruits Right Left   Positive Positive    Radial pulses are 2+ palpable and equal.     Gastrointestinal: soft, nontender, BS WNL, no r/g, no masses palpated.  Musculoskeletal: Negative muscle atrophy/wasting. M/S 5/5 throughout, Extremities without ischemic changes.  Neurologic: A&O X 3; Appropriate Affect ; SENSATION ;normal;  Speech is normal CN 2-12 intact Except uvula deviation to the left with palate rise, Pain and light touch intact in extremities, Motor exam as listed above         Non-Invasive Vascular Imaging CAROTID DUPLEX 08/21/2014   Right ICA: 60 - 79 % stenosis. Left ICA: patent CEA site.  These findings are Unchanged from previous exam.   Assessment: Sara Holt is a 80 y.o. female who is status post left CEA in January 2012. She returns today  for c/o more episodes of syncope last week, stood up and started up her stairs, and felt light headed, did not lose consciousness; and yesterday she was in the shower and found her self on the floor, does not recall if she lost consciousness.  Her ramipril dose was increased last month.  Pt states her blood pressure has never been as low as it is today. She has an appointment this afternoon to see the PA in her PCP's office. Pt states she has not seen a cardiologist since she has had syncopal episodes.  Dr. Donnetta Hutching saw pt on 03/11/14 and did not  feel that her syncope was related to her carotid artery stenosis at that time. In August 2015 she had a very brief syncopal episode after standing up and walking for a while.  Today's carotid Duplex reveals moderate/severe right ICA stenosis and a patent left CEA site; unchanged from previous Duplex on 03/04/14.  Her DM is in good control and she is a non smoker.  Follow up with PCP today as scheduled re hypotension and presyncope and syncope. Pt has not seen a cardiologist.   Plan: Based on today's carotid Duplex, HPI, and exam, and after discussing with Dr. Oneida Alar, patient is advised to follow-up in 6 months with Carotid Duplex.  Follow up with PCP today as scheduled re hypotension and presyncope and syncope.  I discussed in depth with the patient the nature of atherosclerosis, and emphasized the importance of maximal medical management including strict control of blood pressure, blood glucose, and lipid levels, obtaining regular exercise, and continued cessation of smoking.  The patient is aware that without maximal medical management the underlying atherosclerotic disease process will progress, limiting the benefit of any interventions. The patient was given information about stroke prevention and what symptoms should prompt the patient to seek immediate medical care. Thank you for allowing [place] to participate in this patient's care.  Clemon Chambers,  RN, MSN, FNP-C [facility] Office: [facility] Physician: Oneida Alar  08/21/2014 1:32 PM

## 2014-08-21 NOTE — Patient Instructions (Addendum)
Stroke Prevention Some medical conditions and behaviors are associated with an increased chance of having a stroke. You may prevent a stroke by making healthy choices and managing medical conditions. HOW CAN I REDUCE MY RISK OF HAVING A STROKE?   Stay physically active. Get at least 30 minutes of activity on most or all days.  Do not smoke. It may also be helpful to avoid exposure to secondhand smoke.  Limit alcohol use. Moderate alcohol use is considered to be:  No more than 2 drinks per day for men.  No more than 1 drink per day for nonpregnant women.  Eat healthy foods. This involves:  Eating 5 or more servings of fruits and vegetables a day.  Making dietary changes that address high blood pressure (hypertension), high cholesterol, diabetes, or obesity.  Manage your cholesterol levels.  Making food choices that are high in fiber and low in saturated fat, trans fat, and cholesterol may control cholesterol levels.  Take any prescribed medicines to control cholesterol as directed by your health care provider.  Manage your diabetes.  Controlling your carbohydrate and sugar intake is recommended to manage diabetes.  Take any prescribed medicines to control diabetes as directed by your health care provider.  Control your hypertension.  Making food choices that are low in salt (sodium), saturated fat, trans fat, and cholesterol is recommended to manage hypertension.  Take any prescribed medicines to control hypertension as directed by your health care provider.  Maintain a healthy weight.  Reducing calorie intake and making food choices that are low in sodium, saturated fat, trans fat, and cholesterol are recommended to manage weight.  Stop drug abuse.  Avoid taking birth control pills.  Talk to your health care provider about the risks of taking birth control pills if you are over 35 years old, smoke, get migraines, or have ever had a blood clot.  Get evaluated for sleep  disorders (sleep apnea).  Talk to your health care provider about getting a sleep evaluation if you snore a lot or have excessive sleepiness.  Take medicines only as directed by your health care provider.  For some people, aspirin or blood thinners (anticoagulants) are helpful in reducing the risk of forming abnormal blood clots that can lead to stroke. If you have the irregular heart rhythm of atrial fibrillation, you should be on a blood thinner unless there is a good reason you cannot take them.  Understand all your medicine instructions.  Make sure that other conditions (such as anemia or atherosclerosis) are addressed. SEEK IMMEDIATE MEDICAL CARE IF:   You have sudden weakness or numbness of the face, arm, or leg, especially on one side of the body.  Your face or eyelid droops to one side.  You have sudden confusion.  You have trouble speaking (aphasia) or understanding.  You have sudden trouble seeing in one or both eyes.  You have sudden trouble walking.  You have dizziness.  You have a loss of balance or coordination.  You have a sudden, severe headache with no known cause.  You have new chest pain or an irregular heartbeat. Any of these symptoms may represent a serious problem that is an emergency. Do not wait to see if the symptoms will go away. Get medical help at once. Call your local emergency services (911 in [place] Do not drive yourself to the hospital. Document Released: 07/28/2004 Document Revised: 11/04/2013 Document Reviewed: 12/21/2012 Exit[employer] Patient Information 2015 [employer]. This information is not intended to replace advice given   to you by your health care provider. Make sure you discuss any questions you have with your health care provider.   Near-Syncope Near-syncope (commonly known as near fainting) is sudden weakness, dizziness, or feeling like you might pass out. During an episode of near-syncope, you may also develop pale skin, have tunnel  vision, or feel sick to your stomach (nauseous). Near-syncope may occur when getting up after sitting or while standing for a long time. It is caused by a sudden decrease in blood flow to the brain. This decrease can result from various causes or triggers, most of which are not serious. However, because near-syncope can sometimes be a sign of something serious, a medical evaluation is required. The specific cause is often not determined. HOME CARE INSTRUCTIONS  Monitor your condition for any changes. The following actions may help to alleviate any discomfort you are experiencing:  Have someone stay with you until you feel stable.  Lie down right away and prop your feet up if you start feeling like you might faint. Breathe deeply and steadily. Wait until all the symptoms have passed. Most of these episodes last only a few minutes. You may feel tired for several hours.   Drink enough fluids to keep your urine clear or pale yellow.   If you are taking blood pressure or heart medicine, get up slowly when seated or lying down. Take several minutes to sit and then stand. This can reduce dizziness.  Follow up with your health care provider as directed. SEEK IMMEDIATE MEDICAL CARE IF:   You have a severe headache.   You have unusual pain in the chest, abdomen, or back.   You are bleeding from the mouth or rectum, or you have black or tarry stool.   You have an irregular or very fast heartbeat.   You have repeated fainting or have seizure-like jerking during an episode.   You faint when sitting or lying down.   You have confusion.   You have difficulty walking.   You have severe weakness.   You have vision problems.  MAKE SURE YOU:   Understand these instructions.  Will watch your condition.  Will get help right away if you are not doing well or get worse. Document Released: 06/20/2005 Document Revised: 06/25/2013 Document Reviewed: 11/23/2012 [facility] Patient  Information 2015 [place], [place]. This information is not intended to replace advice given to you by your health care provider. Make sure you discuss any questions you have with your health care provider.

## 2014-09-09 ENCOUNTER — Other Ambulatory Visit (HOSPITAL_COMMUNITY): Payer: Medicare Other

## 2014-09-09 ENCOUNTER — Ambulatory Visit: Payer: Medicare Other | Admitting: Vascular Surgery

## 2014-09-17 ENCOUNTER — Ambulatory Visit (AMBULATORY_SURGERY_CENTER): Payer: Self-pay

## 2014-09-17 VITALS — Ht 64.0 in | Wt 164.4 lb

## 2014-09-17 DIAGNOSIS — Z860100 Personal history of colon polyps, unspecified: Secondary | ICD-10-CM

## 2014-09-17 MED ORDER — SUPREP BOWEL PREP KIT 17.5-3.13-1.6 GM/177ML PO SOLN: 1.0000 | Freq: Once | ORAL | Status: DC

## 2014-09-17 NOTE — Progress Notes (Signed)
No allergies to eggs or soy No diet/weight loss meds No home oxygen No past problems with anesthesia  Has email  Emmi instructions given for colonoscopy 

## 2014-09-19 NOTE — Telephone Encounter (Signed)
Note not needed 

## 2014-09-29 ENCOUNTER — Telehealth: Payer: Self-pay | Admitting: Gastroenterology

## 2014-09-29 NOTE — Telephone Encounter (Signed)
no

## 2014-09-30 ENCOUNTER — Encounter: Payer: Medicare Other | Admitting: Gastroenterology

## 2014-10-02 ENCOUNTER — Encounter: Payer: Medicare Other | Admitting: Gastroenterology

## 2014-11-28 ENCOUNTER — Ambulatory Visit (AMBULATORY_SURGERY_CENTER): Payer: Medicare Other | Admitting: Gastroenterology

## 2014-11-28 ENCOUNTER — Encounter: Payer: Self-pay | Admitting: Gastroenterology

## 2014-11-28 VITALS — BP 127/55 | HR 61 | Temp 97.1°F | Resp 12 | Ht 64.0 in | Wt 164.0 lb

## 2014-11-28 DIAGNOSIS — K5730 Diverticulosis of large intestine without perforation or abscess without bleeding: Secondary | ICD-10-CM

## 2014-11-28 DIAGNOSIS — D120 Benign neoplasm of cecum: Secondary | ICD-10-CM | POA: Diagnosis not present

## 2014-11-28 DIAGNOSIS — Z86010 Personal history of colonic polyps: Secondary | ICD-10-CM

## 2014-11-28 LAB — GLUCOSE, CAPILLARY
GLUCOSE-CAPILLARY: 154 mg/dL — AB (ref 65–99)
Glucose-Capillary: 140 mg/dL — ABNORMAL HIGH (ref 65–99)

## 2014-11-28 MED ORDER — SODIUM CHLORIDE 0.9 % IV SOLN: 500.0000 mL | INTRAVENOUS | Status: DC

## 2014-11-28 NOTE — Patient Instructions (Signed)
YOU HAD AN ENDOSCOPIC PROCEDURE TODAY AT [facility]:   Refer to the procedure report that was given to you for any specific questions about what was found during the examination.  If the procedure report does not answer your questions, please call your gastroenterologist to clarify.  If you requested that your care partner not be given the details of your procedure findings, then the procedure report has been included in a sealed envelope for you to review at your convenience later.  YOU SHOULD EXPECT: Some feelings of bloating in the abdomen. Passage of more gas than usual.  Walking can help get rid of the air that was put into your GI tract during the procedure and reduce the bloating. If you had a lower endoscopy (such as a colonoscopy or flexible sigmoidoscopy) you may notice spotting of blood in your stool or on the toilet paper. If you underwent a bowel prep for your procedure, you may not have a normal bowel movement for a few days.  Please Note:  You might notice some irritation and congestion in your nose or some drainage.  This is from the oxygen used during your procedure.  There is no need for concern and it should clear up in a day or so.  SYMPTOMS TO REPORT IMMEDIATELY:   Following lower endoscopy (colonoscopy or flexible sigmoidoscopy):  Excessive amounts of blood in the stool  Significant tenderness or worsening of abdominal pains  Swelling of the abdomen that is new, acute  Fever of 100F or higher  For urgent or emergent issues, a gastroenterologist can be reached at any hour by calling [phone].   DIET: Your first meal following the procedure should be a small meal and then it is ok to progress to your normal diet. Heavy or fried foods are harder to digest and may make you feel nauseous or bloated.  Likewise, meals heavy in dairy and vegetables can increase bloating.  Drink plenty of fluids but you should avoid alcoholic beverages for 24  hours.  ACTIVITY:  You should plan to take it easy for the rest of today and you should NOT DRIVE or use heavy machinery until tomorrow (because of the sedation medicines used during the test).    FOLLOW UP: Our staff will call the number listed on your records the next business day following your procedure to check on you and address any questions or concerns that you may have regarding the information given to you following your procedure. If we do not reach you, we will leave a message.  However, if you are feeling well and you are not experiencing any problems, there is no need to return our call.  We will assume that you have returned to your regular daily activities without incident.  If any biopsies were taken you will be contacted by phone or by letter within the next 1-3 weeks.  Please call us at [phone] if you have not heard about the biopsies in 3 weeks.    SIGNATURES/CONFIDENTIALITY: You and/or your care partner have signed paperwork which will be entered into your electronic medical record.  These signatures attest to the fact that that the information above on your After Visit Summary has been reviewed and is understood.  Full responsibility of the confidentiality of this discharge information lies with you and/or your care-partner.  Polyps, Diverticulosis, high fiber diet-handouts given  Repeat colonoscopy in 5 years-2021

## 2014-11-28 NOTE — Progress Notes (Signed)
Report to PACU, RN, vss, BBS= Clear.  

## 2014-11-28 NOTE — Progress Notes (Signed)
Called to room to assist during endoscopic procedure.  Patient ID and intended procedure confirmed with present staff. Received instructions for my participation in the procedure from the performing physician.  

## 2014-11-28 NOTE — Op Note (Signed)
[facility]  [address] [place], [address]   COLONOSCOPY PROCEDURE REPORT  PATIENT: Sara, Holt  MR#: [id number] BIRTHDATE: [birth year] , 80  yrs. old GENDER: female ENDOSCOPIST: Inda Castle, MD REFERRED BY: PROCEDURE DATE:  11/28/2014 PROCEDURE:   Colonoscopy, surveillance and Colonoscopy with snare polypectomy First Screening Colonoscopy - Avg.  risk and is 50 yrs.  old or older - No.  Prior Negative Screening - Now for repeat screening. N/A  History of Adenoma - Now for follow-up colonoscopy & has been > or = to 3 yrs.  Yes hx of adenoma.  Has been 3 or more years since last colonoscopy.  Polyps removed today? Yes ASA CLASS:   Class II INDICATIONS:high risk patient with personal history of colonic polyps.Multiple adenomatous polyps 2013 MEDICATIONS: Monitored anesthesia care, Propofol 170 mg IV, and Epinephrine 40 mg IV  DESCRIPTION OF PROCEDURE:   After the risks benefits and alternatives of the procedure were thoroughly explained, informed consent was obtained.  The digital rectal exam revealed no abnormalities of the rectum.   The LB [id number]  endoscope was introduced through the anus and advanced to the cecum, which was identified by both the appendix and ileocecal valve. No adverse events experienced.   The quality of the prep was (Suprep was used) adequate  The instrument was then slowly withdrawn as the colon was fully examined. Estimated blood loss is zero unless otherwise noted in this procedure report.      COLON FINDINGS: There was moderate diverticulosis noted in the descending colon and sigmoid colon with associated muscular hypertrophy.   A sessile polyp measuring 4 mm in size was found at the cecum.  A polypectomy was performed with a cold snare.  The resection was complete, the polyp tissue was completely retrieved and sent to histology.  Retroflexed views revealed no abnormalities. The time to cecum = 3.6  Withdrawal time = 8.3   The scope was withdrawn and the procedure completed. COMPLICATIONS: There were no immediate complications.  ENDOSCOPIC IMPRESSION: 1.   There was moderate diverticulosis noted in the descending colon and sigmoid colon 2.   Sessile polyp was found at the cecum; polypectomy was performed with a cold snare  RECOMMENDATIONS: Repeat Colonoscopy in 5 years.  eSigned:  Inda Castle, MD 11/28/2014 8:53 AM   cc: Lowella Dell, MD

## 2014-12-03 DIAGNOSIS — M189 Osteoarthritis of first carpometacarpal joint, unspecified: Secondary | ICD-10-CM | POA: Insufficient documentation

## 2014-12-05 ENCOUNTER — Other Ambulatory Visit: Payer: Self-pay | Admitting: *Deleted

## 2014-12-05 DIAGNOSIS — R55 Syncope and collapse: Secondary | ICD-10-CM

## 2014-12-09 ENCOUNTER — Encounter: Payer: Self-pay | Admitting: Gastroenterology

## 2014-12-11 ENCOUNTER — Encounter: Payer: Self-pay | Admitting: Neurology

## 2014-12-11 ENCOUNTER — Ambulatory Visit (INDEPENDENT_AMBULATORY_CARE_PROVIDER_SITE_OTHER): Payer: Medicare Other | Admitting: Neurology

## 2014-12-11 ENCOUNTER — Encounter: Payer: Self-pay | Admitting: Gastroenterology

## 2014-12-11 VITALS — BP 114/58 | HR 66 | Resp 16 | Ht 64.0 in | Wt 164.4 lb

## 2014-12-11 DIAGNOSIS — E1142 Type 2 diabetes mellitus with diabetic polyneuropathy: Secondary | ICD-10-CM | POA: Diagnosis not present

## 2014-12-11 DIAGNOSIS — R55 Syncope and collapse: Secondary | ICD-10-CM | POA: Diagnosis not present

## 2014-12-11 DIAGNOSIS — I6523 Occlusion and stenosis of bilateral carotid arteries: Secondary | ICD-10-CM

## 2014-12-11 DIAGNOSIS — R269 Unspecified abnormalities of gait and mobility: Secondary | ICD-10-CM | POA: Diagnosis not present

## 2014-12-11 DIAGNOSIS — M4802 Spinal stenosis, cervical region: Secondary | ICD-10-CM | POA: Diagnosis not present

## 2014-12-11 NOTE — Progress Notes (Signed)
[facility]  PATIENT: Sara Holt DOB: [birth year]  REFERRING DOCTOR OR PCP:  Lowella Dell [facility]  [phone] fax).   ZAn Elonda Husky is her cardiologist.   SOURCE: paitent and EMR records  _________________________________   HISTORICAL  CHIEF COMPLAINT:  Chief Complaint  Patient presents with  . Dizziness    Sts. onset of dizziness about 2 mos. ago.  Worse with changes in position.  Multiple falls due to dizziness.She was seen and treated at [facility], [place]. ct head was ok./fim    HISTORY OF PRESENT ILLNESS:  I had the pleasure seeing you patient, Sara Holt, for neurologic consultation regarding her syncopal episodes.  She is a 80 year old woman who has had multiple episodes of presyncope and one episode of syncope. Her first episode occurred 4 months ago. She arose from a chair, felt strange, and then fell. She had several seconds of loss of consciousness though it is unclear if it happened before or after the fall. Upon becoming conscious again, she was immediately back to her baseline. Since then, she has had 3 other episodes of presyncope associated with falls but not with loss of consciousness. Each of these episodes occurred after she was either sitting or laying down and stood up. She was sitting or laying down for least 30 minutes for all these episodes. With the second to last episode, she fell on the stairs and bruised up her left side. The last episode occurred last week and she landed on her face and left arm and fractured her nasal septum and sprained her left wrist and thumb.  She has had multiple episodes that have been milder. These will cause her to feel lightheaded but to not fall. These episodes are occurring several times a week, the last episode occurring this morning while she arose from the chair in the waiting room.     She has a right bundle branch block by her report.    She has had a cardiology evaluation and [place] with Dr. Elonda Husky.  This included a tilt table test and she was told that the results were fine. She also has had an event monitor but the results have not been made available yet.   She is on Coreg, hydrochlorothiazide and ramipril for her blood pressure. The ramipril was reduced recently.     She denies any tremor or change in her gait. She has non-insulin-dependent diabetes mellitus and is on Janumet XR.    She denies any numbness her feet.   She has cervical degenerative changed but has not had much neck pain.   She has had more bladder dysfunction -- frequency and incontinence and gets electrical impulse therapy monthly by Dr. Luberta Robertson.  Carotid Doppler study 08/22/2014 showed 60-79 percent stenosis of the right ICA which increase him from her prior study. She has left external carotid artery stenosis.   She has had left CEA several years ago.  I personally reviewed the CT images from her urgency room visit after her fall 2 weeks ago. The brain shows mild cortical atrophy. There is a nasal septum fracture associated mucoperiosteal swelling   REVIEW OF SYSTEMS: Constitutional: No fevers, chills, sweats, or change in appetite Eyes: No visual changes, double vision, eye pain Ear, nose and throat: No hearing loss, ear pain, nasal congestion, sore throat Cardiovascular: No chest pain, palpitations Respiratory: No shortness of breath at rest or with exertion.   No wheezes GastrointestinaI: No nausea, vomiting, diarrhea, abdominal pain, fecal incontinence Genitourinary: No dysuria, urinary  retention or frequency.  No nocturia. Musculoskeletal: Notes some neck pain, back pain Integumentary: No rash, pruritus, skin lesions Neurological: as above Psychiatric: No  depression at this time.  No anxiety Endocrine: No palpitations, diaphoresis, change in appetite, change in weigh or increased thirst Hematologic/Lymphatic: No anemia, purpura, petechiae. Allergic/Immunologic: No itchy/runny eyes, nasal congestion,  recent allergic reactions, rashes  ALLERGIES: Allergies  Allergen Reactions  . Buprenorphine Hcl Nausea And Vomiting    nausea  . Morphine And Related Nausea And Vomiting    nausea  . Sulfa Antibiotics Nausea And Vomiting    "deathly ill"    HOME MEDICATIONS:  Current outpatient prescriptions:  .  alendronate (FOSAMAX) 70 MG tablet, Take 70 mg by mouth every 7 (seven) days. Take with a full glass of water on an empty stomach., Disp: , Rfl:  .  Calcium Carbonate-Vitamin D (CALCIUM PLUS VITAMIN D PO), Take 500 mg by mouth 3 (three) times daily., Disp: , Rfl:  .  CYMBALTA 60 MG capsule, Take 1 tablet by mouth Daily. , Disp: , Rfl:  .  desoximetasone (TOPICORT) 0.25 % cream, Apply topically continuous as needed., Disp: , Rfl:  .  hydrochlorothiazide (HYDRODIURIL) 25 MG tablet, Take 1 tablet by mouth Daily. , Disp: , Rfl:  .  hydrocortisone (ANUSOL-HC) 25 MG suppository, Place 1 suppository (25 mg total) rectally every 12 (twelve) hours., Disp: 12 suppository, Rfl: 1 .  JANUMET XR 50-1000 MG TB24, , Disp: , Rfl:  .  MYRBETRIQ 25 MG TB24 tablet, , Disp: , Rfl:  .  omeprazole (PRILOSEC) 40 MG capsule, Take 40 mg by mouth daily., Disp: , Rfl:  .  ONE TOUCH ULTRA TEST test strip, , Disp: , Rfl: 0 .  OVER THE COUNTER MEDICATION, Place 1 drop into both eyes at bedtime. dry eyes med, Disp: , Rfl:  .  ramipril (ALTACE) 10 MG capsule, Take 10 mg by mouth daily. , Disp: , Rfl:  .  simvastatin (ZOCOR) 10 MG tablet, Take 10 mg by mouth Daily. , Disp: , Rfl:  .  traMADol (ULTRAM) 50 MG tablet, Take 50 mg by mouth every 6 (six) hours as needed. For pain, Disp: , Rfl:  .  amoxicillin (AMOXIL) 500 MG capsule, , Disp: , Rfl: 1 .  aspirin 81 MG tablet, Take 81 mg by mouth daily., Disp: , Rfl:  .  carvedilol (COREG) 6.25 MG tablet, Take 6.25 mg by mouth Twice daily. , Disp: , Rfl:  .  cloNIDine (CATAPRES) 0.1 MG tablet, Take 0.1 mg by mouth as needed (if b/p >150)., Disp: , Rfl:   PAST MEDICAL  HISTORY: Past Medical History  Diagnosis Date  . Allergy   . Diabetes mellitus     DIET CONTROLLED, NO MEDS  . GERD (gastroesophageal reflux disease)   . Hyperlipidemia   . Hypertension   . Neuromuscular disorder     nerve problems with hands  . Arthritis     OA  . Disc     problems in neck and back  . Sleep apnea   . Carotid artery occlusion   . Asthma   . Osteoporosis     PAST SURGICAL HISTORY: Past Surgical History  Procedure Laterality Date  . Colonoscopy    . Polypectomy    . Carotid artery angioplasty    . Carpel tunnel      both hands  . Carotid endarterectomy  Jan. 16,2012    LEFT cea  . Knee arthroscopy Left 2010  . Joint replacement Left  2011    Knee    FAMILY HISTORY: Family History  Problem Relation Age of Onset  . Colon cancer Maternal Grandmother   . Cancer Maternal Grandmother     Colon cancer  . Esophageal cancer Neg Hx   . Stomach cancer Neg Hx   . Rectal cancer Neg Hx   . Diabetes Father   . Pneumonia Father   . Peripheral vascular disease Sister     SOCIAL HISTORY:  History   Social History  . Marital Status: Married    Spouse Name: N/A  . Number of Children: 2  . Years of Education: N/A   Occupational History  . Retired    Social History Main Topics  . Smoking status: Never Smoker   . Smokeless tobacco: Never Used  . Alcohol Use: 4.2 oz/week    7 Glasses of wine per week  . Drug Use: No  . Sexual Activity: Not on file   Other Topics Concern  . Not on file   Social History Narrative     PHYSICAL EXAM  Filed Vitals:   12/11/14 0934  BP: 114/58  Pulse: 66  Resp: 16  Height: 5\' 4"  (1.626 m)  Weight: 164 lb 6.4 oz (74.571 kg)    Body mass index is 28.21 kg/(m^2).  Orthostatic BP/Pulse: Laying down: 145/85    70 Sitting  145/80  70 Standing  115/65   84 Standing (delayed)  125/68   80  General: The patient is well-developed and well-nourished and in no acute distress  Eyes:  Funduscopic exam shows normal  optic discs and retinal vessels.  Neck: The neck is supple.  S/p left CEA.   Bruit on left.   Soft bruit right  The neck is nontender.  Cardiovascular: The heart has an irregular rate and rhythm (some pauses / skips)  with a normal S1 and S2. There were no murmurs, gallops or rubs. Lungs are clear to auscultation.  Skin: Extremities are without significant edema.  Musculoskeletal:  Back is nontender  Neurologic Exam  Mental status: The patient is alert and oriented x 3 at the time of the examination. The patient has apparent normal recent and remote memory, with an apparently normal attention span and concentration ability.   Speech is normal.  Cranial nerves: Extraocular movements are full. Pupils are equal, round, and reactive to light and accomodation.  Visual fields are full.  Facial symmetry is present. There is good facial sensation to soft touch bilaterally.Facial strength is normal.  Trapezius and sternocleidomastoid strength is normal. No dysarthria is noted.  The tongue is midline, and the patient has symmetric elevation of the soft palate. No obvious hearing deficits are noted.  Motor:  Muscle bulk is normal.   Tone is normal. Strength is  5 / 5 in all 4 extremities.   Sensory: Sensory testing is intact to  soft touch but mild decreased vibration sensation in toes.    Coordination: Cerebellar testing reveals good finger-nose-finger and heel-to-shin bilaterally.  Gait and station: Station is normal.   Gait is normal for age. Tandem gait is mildly wide.  Romberg is negative.   She has retropulsion when her posture is disturbed.  Reflexes: Deep tendon reflexes are symmetric and normal bilaterally.   Plantar responses are flexor.    DIAGNOSTIC DATA (LABS, IMAGING, TESTING) - I reviewed patient records, labs, notes, testing and imaging myself where available.     ASSESSMENT AND PLAN  Near syncope  Cervical spinal stenosis  Gait disorder  Diabetic polyneuropathy  associated with type 2 diabetes mellitus     In summary, Sara Holt is a 80 year old woman who has had 4 falls as a result of presyncope or syncope, hurting herself twice. Additionally, she has many more episodes of mild or lightheadedness upon standing up with her or a lying or sitting position. On my exam today, she did have orthostatic hypotension, though a tilt table test was reportedly negative. I will try to get the notes and reports from Dr. Elonda Husky and [facility] cardiology.  I am also concerned that she had severe retropulsion on her current examination.   She has no other evidence of a parkinsonian disorder with normal muscle tone and no tremors. She does have a history of significant cervical spondylosis and possibly cervical stenosis. We need to check an MRI of the cervical spine to make sure that the cervical spondylosis has not become a myelopathy affecting her gait.  I advised her to stand up slowly and to wait 30-60 seconds before walking.   I see nothing in her history that is concerning for a seizure so that I am comfortable with her driving again.  She also appears to have a very mild diabetic polyneuropathy that might possibly contribute to an autonomic neuropathy. If spells persist, she may need to have her blood pressure medicines changed and I will try to coordinate this with her primary care.  She will return to see me in 2 months or sooner if she has new or worsening neurologic symptoms.   Richard A. Felecia Shelling, MD, PhD 09/03/2023, 4:27 PM Certified in Neurology, Clinical Neurophysiology, Sleep Medicine, Pain Medicine and Neuroimaging  [facility] [address], [place], [place] [address] [phone]

## 2014-12-16 ENCOUNTER — Telehealth: Payer: Self-pay | Admitting: Hematology & Oncology

## 2014-12-16 NOTE — Telephone Encounter (Signed)
Lt mess for regarding np appt on 01/12/15 at 10.

## 2014-12-19 ENCOUNTER — Telehealth: Payer: Self-pay | Admitting: Hematology & Oncology

## 2014-12-19 NOTE — Telephone Encounter (Signed)
LT MESS REGARDING [facility] PT APPT 7/11

## 2014-12-22 ENCOUNTER — Telehealth: Payer: Self-pay | Admitting: Neurology

## 2014-12-22 DIAGNOSIS — R55 Syncope and collapse: Secondary | ICD-10-CM

## 2014-12-22 DIAGNOSIS — M4802 Spinal stenosis, cervical region: Secondary | ICD-10-CM

## 2014-12-22 DIAGNOSIS — R269 Unspecified abnormalities of gait and mobility: Secondary | ICD-10-CM

## 2014-12-22 NOTE — Telephone Encounter (Signed)
Patient called inquiring when she would be scheduled for MRI. Please call and advise. Patient can be reached at [phone].

## 2014-12-22 NOTE — Telephone Encounter (Signed)
I am forwarding this to you!  MRI

## 2014-12-22 NOTE — Telephone Encounter (Signed)
I have spoken with Sara Holt this afternoon and advised that I do not see that mri c-spine was ordered.  I have confirmed with RAS that Sara Holt would like mri c-spine without contrast, and have ordered this/fim

## 2014-12-23 ENCOUNTER — Telehealth: Payer: Self-pay | Admitting: Neurology

## 2014-12-23 ENCOUNTER — Telehealth: Payer: Self-pay | Admitting: Family

## 2014-12-23 NOTE — Telephone Encounter (Signed)
Belcher.  Per RAS, will offer Ativan 1mg  #2, one po 30 min prior to mri, take 2nd tablet with her to appt. to take prn.  Must have driver.  I need to know what pharmacy she would like me to send this to/fim

## 2014-12-23 NOTE — Telephone Encounter (Signed)
Lt mess regarding nw pt appt.

## 2014-12-23 NOTE — Telephone Encounter (Signed)
Patient called and requested a medication be called in for her claustrophobia before her MRI. Please call and advise.

## 2014-12-24 MED ORDER — LORAZEPAM 1 MG PO TABS: ORAL_TABLET | ORAL | Status: DC

## 2014-12-24 NOTE — Telephone Encounter (Signed)
I have spoken with Marlon this afternoon, and offered Ativan for mri.  She is agreeable.  Rx. for Ativan, with below instructions, printed, signed, faxed to [facility] at fax # [phone], per her request/fim

## 2014-12-29 ENCOUNTER — Other Ambulatory Visit: Payer: Self-pay

## 2014-12-31 NOTE — Telephone Encounter (Signed)
Pt called on 12/18/14 to confirm appt.

## 2015-01-03 ENCOUNTER — Ambulatory Visit
Admission: RE | Admit: 2015-01-03 | Discharge: 2015-01-03 | Disposition: A | Discharge status: Home / Self Care | Payer: Medicare Other | Source: Ambulatory Visit | Attending: Neurology | Admitting: Neurology

## 2015-01-03 ENCOUNTER — Other Ambulatory Visit: Payer: Medicare Other

## 2015-01-03 DIAGNOSIS — R269 Unspecified abnormalities of gait and mobility: Secondary | ICD-10-CM

## 2015-01-03 DIAGNOSIS — R55 Syncope and collapse: Secondary | ICD-10-CM

## 2015-01-03 DIAGNOSIS — M4802 Spinal stenosis, cervical region: Secondary | ICD-10-CM

## 2015-01-06 NOTE — Telephone Encounter (Signed)
-----   Message from Britt Bottom, MD sent at 01/05/2015  2:36 PM EDT ----- Please let her know the MRi of the cervical spine showed that the degenerative changes have worsened since 2003 MRi.    Shows the spinal cord is crowded at 2 levels but not actually compressed so it should not affect gait much.

## 2015-01-06 NOTE — Telephone Encounter (Signed)
I have spoken with Sara Holt this morning, and per RAS, advised that degen changes in neck have worsened some since her 2003 mri, causing some crowding at 2 levels, but no cord compression, so this should not affect gait.  She verbalized understanding of same/fim

## 2015-01-12 ENCOUNTER — Other Ambulatory Visit (HOSPITAL_BASED_OUTPATIENT_CLINIC_OR_DEPARTMENT_OTHER): Payer: Medicare Other

## 2015-01-12 ENCOUNTER — Other Ambulatory Visit: Payer: Self-pay | Admitting: Family

## 2015-01-12 ENCOUNTER — Ambulatory Visit (HOSPITAL_BASED_OUTPATIENT_CLINIC_OR_DEPARTMENT_OTHER): Payer: Medicare Other | Admitting: Family

## 2015-01-12 ENCOUNTER — Ambulatory Visit: Payer: Medicare Other

## 2015-01-12 VITALS — BP 152/72 | HR 74 | Temp 97.9°F

## 2015-01-12 DIAGNOSIS — D509 Iron deficiency anemia, unspecified: Secondary | ICD-10-CM | POA: Diagnosis present

## 2015-01-12 DIAGNOSIS — D649 Anemia, unspecified: Secondary | ICD-10-CM

## 2015-01-12 DIAGNOSIS — I6523 Occlusion and stenosis of bilateral carotid arteries: Secondary | ICD-10-CM

## 2015-01-12 LAB — CBC WITH DIFFERENTIAL (CANCER CENTER ONLY)
BASO#: 0.1 10*3/uL (ref 0.0–0.2)
BASO%: 0.8 % (ref 0.0–2.0)
EOS ABS: 0.3 10*3/uL (ref 0.0–0.5)
EOS%: 4.1 % (ref 0.0–7.0)
HEMATOCRIT: 30.8 % — AB (ref 34.8–46.6)
HEMOGLOBIN: 9.5 g/dL — AB (ref 11.6–15.9)
LYMPH#: 1.8 10*3/uL (ref 0.9–3.3)
LYMPH%: 30.1 % (ref 14.0–48.0)
MCH: 23.3 pg — ABNORMAL LOW (ref 26.0–34.0)
MCHC: 30.8 g/dL — ABNORMAL LOW (ref 32.0–36.0)
MCV: 76 fL — AB (ref 81–101)
MONO#: 0.4 10*3/uL (ref 0.1–0.9)
MONO%: 6.3 % (ref 0.0–13.0)
NEUT#: 3.5 10*3/uL (ref 1.5–6.5)
NEUT%: 58.7 % (ref 39.6–80.0)
Platelets: 300 10*3/uL (ref 145–400)
RBC: 4.07 10*6/uL (ref 3.70–5.32)
RDW: 16.6 % — ABNORMAL HIGH (ref 11.1–15.7)
WBC: 6 10*3/uL (ref 3.9–10.0)

## 2015-01-12 LAB — IRON AND TIBC CHCC
%SAT: 5 % — ABNORMAL LOW (ref 21–57)
Iron: 20 ug/dL — ABNORMAL LOW (ref 41–142)
TIBC: 424 ug/dL (ref 236–444)
UIBC: 404 ug/dL — AB (ref 120–384)

## 2015-01-12 LAB — CMP (CANCER CENTER ONLY)
ALBUMIN: 3.8 g/dL (ref 3.3–5.5)
ALT: 16 U/L (ref 10–47)
AST: 19 U/L (ref 11–38)
Alkaline Phosphatase: 36 U/L (ref 26–84)
BUN: 23 mg/dL — AB (ref 7–22)
CALCIUM: 10 mg/dL (ref 8.0–10.3)
CO2: 30 mEq/L (ref 18–33)
Chloride: 98 mEq/L (ref 98–108)
Creat: 1.1 mg/dl (ref 0.6–1.2)
Glucose, Bld: 169 mg/dL — ABNORMAL HIGH (ref 73–118)
POTASSIUM: 4.2 meq/L (ref 3.3–4.7)
Sodium: 137 mEq/L (ref 128–145)
Total Bilirubin: 0.9 mg/dl (ref 0.20–1.60)
Total Protein: 6.7 g/dL (ref 6.4–8.1)

## 2015-01-12 LAB — FERRITIN CHCC: FERRITIN: 8 ng/mL — AB (ref 9–269)

## 2015-01-12 LAB — CHCC SATELLITE - SMEAR

## 2015-01-12 NOTE — Progress Notes (Signed)
Hematology/Oncology Consultation   Name: Sara Holt      MRN: [id number]    Location: Room/bed info not found  Date: 01/12/2015 Time:10:32 AM   REFERRING PHYSICIAN: Lottie Dawson, MD  REASON FOR CONSULT: Anemia   DIAGNOSIS:  1. Iron deficiency anemia 2. Anemia of renal insufficiency  HISTORY OF PRESENT ILLNESS: Sara Holt is a very pleasant 80 yo caucasian female recently diagnosed with anemia. She has poorly controlled diabetes and stage 3 chronic kidney disease.  She was on a PO iron supplement for a week or so after being diagnosed but then taken off after her iron saturation was documented as 75%. I do not feel that this was a true reading. Her iron saturation today is 5% and ferritin is 8. Her hgb is 9.5 with an MCV of 76.   She is symptomatic with fatigue, SOB with exertion and dizziness with falls. She has had 4 falls the last one resulting in a broken nose and sprained wrist. She states that she was worked-up by cardiology and that the results were negative.  She denies having any episodes of bleeding or bruising.  She has had some numbness and tingling in her hands worse in the right.  She has no family history of anemia. No personal history of cancer. Her grandmother had colon cancer.  She does have 2 daughters. Her oldest has Lupus and is using holistic methods to control her symptoms. She is doing well at this time.  Her blood sugars are running in the 160's-200's despite being on Janumet XR. She does not have an endocrinologist and is followed by her PCP for this.  There has been no problem with infections. No fever, chills, n/v, cough, rash, dizziness, SOB, chest pain, palpitations, abdominal pain, constipation, diarrhea, blood in urine or stool.  She does not smoke and only has an occasional glass of wine.  She states that her last mammogram was a month ago and that it was negative.  She did have a colonoscopy in May which showed moderate diverticulosis in  the descending and sigmoid colon. She also had a sessile polyp removed at the cecum.  Her appetite is ok and she does feel that she should be drinking more fluids. She states that her weight is down 8 lbs over the last few weeks.    ROS: All other 10 point review of systems is negative.   PAST MEDICAL HISTORY:   Past Medical History  Diagnosis Date  . Allergy   . Diabetes mellitus     DIET CONTROLLED, NO MEDS  . GERD (gastroesophageal reflux disease)   . Hyperlipidemia   . Hypertension   . Neuromuscular disorder     nerve problems with hands  . Arthritis     OA  . Disc     problems in neck and back  . Sleep apnea   . Carotid artery occlusion   . Asthma   . Osteoporosis     ALLERGIES: Allergies  Allergen Reactions  . Buprenorphine Hcl Nausea And Vomiting    nausea  . Morphine And Related Nausea And Vomiting    nausea  . Sulfa Antibiotics Nausea And Vomiting    "deathly ill"      MEDICATIONS:  Current Outpatient Prescriptions on File Prior to Visit  Medication Sig Dispense Refill  . alendronate (FOSAMAX) 70 MG tablet Take 70 mg by mouth every 7 (seven) days. Take with a full glass of water on an empty stomach.    Marland Kitchen  amoxicillin (AMOXIL) 500 MG capsule   1  . aspirin 81 MG tablet Take 81 mg by mouth daily.    . Calcium Carbonate-Vitamin D (CALCIUM PLUS VITAMIN D PO) Take 500 mg by mouth 3 (three) times daily.    . carvedilol (COREG) 6.25 MG tablet Take 6.25 mg by mouth Twice daily.     . cloNIDine (CATAPRES) 0.1 MG tablet Take 0.1 mg by mouth as needed (if b/p >150).    . CYMBALTA 60 MG capsule Take 1 tablet by mouth Daily.     Marland Kitchen desoximetasone (TOPICORT) 0.25 % cream Apply topically continuous as needed.    . hydrochlorothiazide (HYDRODIURIL) 25 MG tablet Take 1 tablet by mouth Daily.     . hydrocortisone (ANUSOL-HC) 25 MG suppository Place 1 suppository (25 mg total) rectally every 12 (twelve) hours. 12 suppository 1  . JANUMET XR 50-1000 MG TB24     . LORazepam  (ATIVAN) 1 MG tablet Take one tablet 30 minutes prior to mri.  Take 2nd tablet with you to appt., to take if needed. **MUST HAVE A DRIVER WHILE UNDER THE INFLUENCE OF THIS MEDICATION** 30 tablet 0  . MYRBETRIQ 25 MG TB24 tablet     . omeprazole (PRILOSEC) 40 MG capsule Take 40 mg by mouth daily.    . ONE TOUCH ULTRA TEST test strip   0  . OVER THE COUNTER MEDICATION Place 1 drop into both eyes at bedtime. dry eyes med    . ramipril (ALTACE) 10 MG capsule Take 10 mg by mouth daily.     . simvastatin (ZOCOR) 10 MG tablet Take 10 mg by mouth Daily.     . traMADol (ULTRAM) 50 MG tablet Take 50 mg by mouth every 6 (six) hours as needed. For pain     No current facility-administered medications on file prior to visit.     PAST SURGICAL HISTORY Past Surgical History  Procedure Laterality Date  . Colonoscopy    . Polypectomy    . Carotid artery angioplasty    . Carpel tunnel      both hands  . Carotid endarterectomy  Jan. 16,2012    LEFT cea  . Knee arthroscopy Left 2010  . Joint replacement Left 2011    Knee    FAMILY HISTORY: Family History  Problem Relation Age of Onset  . Colon cancer Maternal Grandmother   . Cancer Maternal Grandmother     Colon cancer  . Esophageal cancer Neg Hx   . Stomach cancer Neg Hx   . Rectal cancer Neg Hx   . Diabetes Father   . Pneumonia Father   . Peripheral vascular disease Sister     SOCIAL HISTORY:  reports that she has never smoked. She has never used smokeless tobacco. She reports that she drinks about 4.2 oz of alcohol per week. She reports that she does not use illicit drugs.  PERFORMANCE STATUS: The patient's performance status is 1 - Symptomatic but completely ambulatory  PHYSICAL EXAM: Most Recent Vital Signs: There were no vitals taken for this visit. BP 152/72 mmHg  Pulse 74  Temp(Src) 97.9 F (36.6 C) (Oral)  General Appearance:    Alert, cooperative, no distress, appears stated age  Head:    Normocephalic, without obvious  abnormality, atraumatic  Eyes:    PERRL, conjunctiva/corneas clear, EOM's intact, fundi    benign, both eyes        Throat:   Lips, mucosa, and tongue normal; teeth and gums normal  Neck:  Supple, symmetrical, trachea midline, no adenopathy;    thyroid:  no enlargement/tenderness/nodules; no carotid   bruit or JVD  Back:     Symmetric, no curvature, ROM normal, no CVA tenderness  Lungs:     Clear to auscultation bilaterally, respirations unlabored  Chest Wall:    No tenderness or deformity   Heart:    Regular rate and rhythm, S1 and S2 normal, no murmur, rub   or gallop     Abdomen:     Soft, non-tender, bowel sounds active all four quadrants,    no masses, no organomegaly        Extremities:   Extremities normal, atraumatic, no cyanosis or edema  Pulses:   2+ and symmetric all extremities  Skin:   Skin color, texture, turgor normal, no rashes or lesions  Lymph nodes:   Cervical, supraclavicular, and axillary nodes normal  Neurologic:   CNII-XII intact, normal strength, sensation and reflexes    throughout   LABORATORY DATA:  No results found for this or any previous visit (from the past 48 hour(s)).    RADIOGRAPHY: No results found.     PATHOLOGY: None  ASSESSMENT/PLAN: Ms. Meneely is a very pleasant 80 yo caucasian female recently diagnosed with microcytic anemia. She has poorly controlled diabetes and stage 3 chronic kidney disease.  She is symptomatic with SOB, fatigue and dizziness with falls. Her iron saturation today is 5% and ferritin is 8. Her Hgb is down at 9.5. There have been no episodes of bleeding.  We will go ahead and schedule her for iron this week and then again in 8 days. She may also need Aranesp. We will wait and see what her erythropoietin level is.   We will plan to see her back 6 weeks after the 2nd dose of iron.  All questions were answered. Both she and her husband know to contact us with any questions or concerns. We can certainly see her much  sooner if necessary.  The patient was discussed with and also seen by Dr. Marin Olp and he is in agreement with the aforementioned.   [facility]     Addendum:   I saw and examined the patient with Sarah.  We looked at her blood smear. She had microcytic red cells. She has some hypochromic red cells. I do not see any target cells. There were no nucleated red cells. There were no with low formation. She had no schistocytes. White cells wwere normal in morphology maturation.I saw no hypersegmented polys. She had no  Atypical lymphocytes. Platelets were adequate in number. Platelets were mature and well granulated.  She has her iron studies which showed a ferritin of only 8. Iron saturation was 5%.  Her corrected reticulocyte count was about 1%. This is quite low.  She has diabetes. I suspect that she also has some erythropoietin issues. Her erythropoietin level was 73.  I think we should see how she does with IV iron. She will need 2 doses.  I think we can hold off on ESA for right now.  We spent about 45 minutes with she and her husband. They are very nice. We review the lab work with them. We answered all their questions.  Laurey Arrow

## 2015-01-14 LAB — HEMOGLOBINOPATHY EVALUATION
HGB A: 98 % — AB (ref 96.8–97.8)
HGB F QUANT: 0 % (ref 0.0–2.0)
Hemoglobin Other: 0 %
Hgb A2 Quant: 2 % — ABNORMAL LOW (ref 2.2–3.2)
Hgb S Quant: 0 %

## 2015-01-14 LAB — ERYTHROPOIETIN: ERYTHROPOIETIN: 73.3 m[IU]/mL — AB (ref 2.6–18.5)

## 2015-01-14 LAB — RETICULOCYTES (CHCC)
ABS RETIC: 63.2 10*3/uL (ref 19.0–186.0)
RBC.: 4.21 MIL/uL (ref 3.87–5.11)
Retic Ct Pct: 1.5 % (ref 0.4–2.3)

## 2015-01-15 ENCOUNTER — Other Ambulatory Visit: Payer: Self-pay | Admitting: Family

## 2015-01-15 ENCOUNTER — Ambulatory Visit (HOSPITAL_BASED_OUTPATIENT_CLINIC_OR_DEPARTMENT_OTHER): Payer: Medicare Other

## 2015-01-15 VITALS — BP 114/42 | HR 68 | Temp 98.2°F | Resp 18

## 2015-01-15 DIAGNOSIS — D509 Iron deficiency anemia, unspecified: Secondary | ICD-10-CM

## 2015-01-15 DIAGNOSIS — D649 Anemia, unspecified: Secondary | ICD-10-CM

## 2015-01-15 MED ORDER — SODIUM CHLORIDE 0.9 % IV SOLN
510.0000 mg | Freq: Once | INTRAVENOUS | Status: DC
  Filled 2015-01-15: qty 17

## 2015-01-15 MED ORDER — SODIUM CHLORIDE 0.9 % IV SOLN
510.0000 mg | Freq: Once | INTRAVENOUS | Status: AC
  Administered 2015-01-15: 510 mg via INTRAVENOUS
  Filled 2015-01-15: qty 17

## 2015-01-15 NOTE — Patient Instructions (Signed)

## 2015-01-22 ENCOUNTER — Ambulatory Visit (HOSPITAL_BASED_OUTPATIENT_CLINIC_OR_DEPARTMENT_OTHER): Payer: Medicare Other

## 2015-01-22 ENCOUNTER — Other Ambulatory Visit: Payer: Self-pay | Admitting: Family

## 2015-01-22 VITALS — BP 143/51 | HR 69 | Temp 97.8°F | Resp 20

## 2015-01-22 DIAGNOSIS — C183 Malignant neoplasm of hepatic flexure: Secondary | ICD-10-CM | POA: Diagnosis not present

## 2015-01-22 DIAGNOSIS — D509 Iron deficiency anemia, unspecified: Secondary | ICD-10-CM | POA: Diagnosis present

## 2015-01-22 MED ORDER — FERUMOXYTOL INJECTION 510 MG/17 ML
510.0000 mg | Freq: Once | INTRAVENOUS | Status: AC
Start: 2015-01-22 — End: 2015-01-22
  Administered 2015-01-22: 510 mg via INTRAVENOUS
  Filled 2015-01-22: qty 17

## 2015-01-22 NOTE — Patient Instructions (Signed)

## 2015-02-09 ENCOUNTER — Encounter: Payer: Self-pay | Admitting: Neurology

## 2015-02-09 ENCOUNTER — Ambulatory Visit (INDEPENDENT_AMBULATORY_CARE_PROVIDER_SITE_OTHER): Payer: Medicare Other | Admitting: Neurology

## 2015-02-09 VITALS — BP 108/60 | HR 66 | Resp 14 | Ht 64.0 in | Wt 164.8 lb

## 2015-02-09 DIAGNOSIS — E1142 Type 2 diabetes mellitus with diabetic polyneuropathy: Secondary | ICD-10-CM

## 2015-02-09 DIAGNOSIS — I6523 Occlusion and stenosis of bilateral carotid arteries: Secondary | ICD-10-CM

## 2015-02-09 DIAGNOSIS — M4802 Spinal stenosis, cervical region: Secondary | ICD-10-CM

## 2015-02-09 DIAGNOSIS — R55 Syncope and collapse: Secondary | ICD-10-CM

## 2015-02-09 DIAGNOSIS — D509 Iron deficiency anemia, unspecified: Secondary | ICD-10-CM

## 2015-02-09 NOTE — Progress Notes (Signed)
[facility]  PATIENT: Sara Holt DOB: 12-20-1934  REFERRING DOCTOR OR PCP:  Sara Holt [facility]  [phone] fax).   ZAn Sara Holt is her cardiologist.   SOURCE: paitent and EMR records  _________________________________   HISTORICAL  CHIEF COMPLAINT:  Chief Complaint  Patient presents with  . Dizziness    Sts. since last ov, she has been dx. with iron def. anemia.  Sts. she has had 2 iron infusions, feels much better.  Sts. fatigue and dizziness are gradually improving./fim  . Presyncope    HISTORY OF PRESENT ILLNESS:  Sara Holt is a 80 year old woman who was having syncopal episodes who also reports right much more than left hand and arm numbness and pain.  Since I last saw her, she was diagnosed with iron deficiency anemia (Ferritin = 8) .   She feels better since then and has not had any more spells of lightheadedness.     Pain and numbness is mostly in the forearm and the first 4 fingers. Sometimes she will feel an uncomfortable sensation radiating down her right arm. Rarely she has some milder sensory changes on the left.  Pain does not appear to be positionally dependent. In the past, she had been placed on gabapentin for a lumbar radiculopathy and did not receive any significant benefit. However, her lumbar radicular pain improved after she was started on Cymbalta. She continues on 60 mg Cymbalta daily.  Cervical spine MRI showed mild to moderate spinal stenosis at C5-C6 and C6-C7. There was normal spinal cord signal. There is potential for right C6 and right C7 nerve root compression.   An MRI of the cervical spine showed: IMPRESSION: This is an abnormal MRI of the cervical spine showed the following: 1. Mild to moderate spinal stenosis at C5-C6 and C6-C7 due to a combination of disc protrusion and uncovertebral spurring. The spinal cord has normal signal both of these levels. 2. Multilevel foraminal narrowing as detailed above. There  is some encroachment upon the right C4 nerve root, the right C6 nerve root and both C7 nerve roots, though there is no definite nerve root compression. 3. Compared to images available from 01/02/2002, there has been multilevel progression of the degenerative changes and worsening of the final stenosis at C5-C6 and C6-C7.   REVIEW OF SYSTEMS: Constitutional: No fevers, chills, sweats, or change in appetite Eyes: No visual changes, double vision, eye pain Ear, nose and throat: No hearing loss, ear pain, nasal congestion, sore throat Cardiovascular: No chest pain, palpitations Respiratory: No shortness of breath at rest or with exertion.   No wheezes GastrointestinaI: No nausea, vomiting, diarrhea, abdominal pain, fecal incontinence Genitourinary: No dysuria, urinary retention or frequency.  No nocturia. Musculoskeletal: Notes some neck pain, back pain Integumentary: No rash, pruritus, skin lesions Neurological: as above Psychiatric: No  depression at this time.  No anxiety Endocrine: No palpitations, diaphoresis, change in appetite, change in weigh or increased thirst Hematologic/Lymphatic: No anemia, purpura, petechiae. Allergic/Immunologic: No itchy/runny eyes, nasal congestion, recent allergic reactions, rashes  ALLERGIES: Allergies  Allergen Reactions  . Buprenorphine Hcl Nausea And Vomiting    nausea  . Morphine And Related Nausea And Vomiting    nausea  . Sulfa Antibiotics Nausea And Vomiting    "deathly ill"    HOME MEDICATIONS:  Current outpatient prescriptions:  .  alendronate (FOSAMAX) 70 MG tablet, Take 70 mg by mouth every 7 (seven) days. Take with a full glass of water on an empty stomach., Disp: , Rfl:  .  aspirin 81 MG tablet, Take 81 mg by mouth daily., Disp: , Rfl:  .  Calcium Carbonate-Vitamin D (CALCIUM PLUS VITAMIN D PO), Take 500 mg by mouth 3 (three) times daily., Disp: , Rfl:  .  carvedilol (COREG) 6.25 MG tablet, Take 6.25 mg by mouth Twice daily. ,  Disp: , Rfl:  .  CYMBALTA 60 MG capsule, Take 1 tablet by mouth Daily. , Disp: , Rfl:  .  desoximetasone (TOPICORT) 0.25 % cream, Apply topically continuous as needed., Disp: , Rfl:  .  hydrochlorothiazide (HYDRODIURIL) 25 MG tablet, Take 1 tablet by mouth Daily. , Disp: , Rfl:  .  JANUMET XR 50-1000 MG TB24, , Disp: , Rfl:  .  omeprazole (PRILOSEC) 40 MG capsule, Take 40 mg by mouth daily., Disp: , Rfl:  .  ONE TOUCH ULTRA TEST test strip, , Disp: , Rfl: 0 .  OVER THE COUNTER MEDICATION, Place 1 drop into both eyes at bedtime. dry eyes med, Disp: , Rfl:  .  ramipril (ALTACE) 10 MG capsule, Take 10 mg by mouth daily. , Disp: , Rfl:  .  simvastatin (ZOCOR) 10 MG tablet, Take 10 mg by mouth Daily. , Disp: , Rfl:  .  traMADol (ULTRAM) 50 MG tablet, Take 50 mg by mouth every 6 (six) hours as needed. For pain, Disp: , Rfl:  .  amoxicillin (AMOXIL) 500 MG capsule, , Disp: , Rfl: 1 .  cloNIDine (CATAPRES) 0.1 MG tablet, Take 0.1 mg by mouth as needed (if b/p >150)., Disp: , Rfl:  .  hydrocortisone (ANUSOL-HC) 25 MG suppository, Place 1 suppository (25 mg total) rectally every 12 (twelve) hours. (Patient not taking: Reported on 02/09/2015), Disp: 12 suppository, Rfl: 1 .  LORazepam (ATIVAN) 1 MG tablet, Take one tablet 30 minutes prior to mri.  Take 2nd tablet with you to appt., to take if needed. **MUST HAVE A DRIVER WHILE UNDER THE INFLUENCE OF THIS MEDICATION** (Patient not taking: Reported on 02/09/2015), Disp: 30 tablet, Rfl: 0 .  MYRBETRIQ 25 MG TB24 tablet, , Disp: , Rfl:   PAST MEDICAL HISTORY: Past Medical History  Diagnosis Date  . Allergy   . Diabetes mellitus     DIET CONTROLLED, NO MEDS  . GERD (gastroesophageal reflux disease)   . Hyperlipidemia   . Hypertension   . Neuromuscular disorder     nerve problems with hands  . Arthritis     OA  . Disc     problems in neck and back  . Sleep apnea   . Carotid artery occlusion   . Asthma   . Osteoporosis     PAST SURGICAL  HISTORY: Past Surgical History  Procedure Laterality Date  . Colonoscopy    . Polypectomy    . Carotid artery angioplasty    . Carpel tunnel      both hands  . Carotid endarterectomy  Jan. 16,2012    LEFT cea  . Knee arthroscopy Left 2010  . Joint replacement Left 2011    Knee    FAMILY HISTORY: Family History  Problem Relation Age of Onset  . Colon cancer Maternal Grandmother   . Cancer Maternal Grandmother     Colon cancer  . Esophageal cancer Neg Hx   . Stomach cancer Neg Hx   . Rectal cancer Neg Hx   . Diabetes Father   . Pneumonia Father   . Peripheral vascular disease Sister     SOCIAL HISTORY:  History   Social History  . Marital Status:  Married    Spouse Name: N/A  . Number of Children: 2  . Years of Education: N/A   Occupational History  . Retired    Social History Main Topics  . Smoking status: Never Smoker   . Smokeless tobacco: Never Used  . Alcohol Use: 4.2 oz/week    7 Glasses of wine per week  . Drug Use: No  . Sexual Activity: Not on file   Other Topics Concern  . Not on file   Social History Narrative     PHYSICAL EXAM  Filed Vitals:   02/09/15 1013  BP: 108/60  Pulse: 66  Resp: 14  Height: 5\' 4"  (1.626 m)  Weight: 164 lb 12.8 oz (74.753 kg)    Body mass index is 28.27 kg/(m^2).  Orthostatic BP/Pulse: Laying down: 145/85    70 Sitting  145/80  70 Standing  115/65   84 Standing (delayed)  125/68   80  General: The patient is well-developed and well-nourished and in no acute distress  Neck: The neck is supple.  S/p left CEA.   The neck is nontender with good ROM  Neurologic Exam  Mental status: The patient is alert and oriented x 3 at the time of the examination. The patient has apparent normal recent and remote memory, with an apparently normal attention span and concentration ability.   Speech is normal.  Cranial nerves: Extraocular movements are full  Facial strength is normal.  Trapezius and sternocleidomastoid  strength is normal. No dysarthria is noted.  The tongue is midline, and the patient has symmetric elevation of the soft palate. No obvious hearing deficits are noted.  Motor:  Muscle bulk is normal.   Tone is normal. Strength is  5 / 5 in all 4 extremities.   Sensory: Sensory testing is intact to soft touch but mild decreased vibration sensation in toes.    Coordination: Cerebellar testing reveals good finger-nose-finger and heel-to-shin bilaterally.  Gait and station: Station is normal.   Gait is normal for age. Tandem gait is mildly wide. She has retropulsion when her posture is disturbed.  Reflexes: Deep tendon reflexes are moderately reduced in the right biceps relative to the left and slightly reduced in the right triceps relative to the left.       DIAGNOSTIC DATA (LABS, IMAGING, TESTING) - I reviewed patient records, labs, notes, testing and imaging myself where available.     ASSESSMENT AND PLAN  Near syncope  Cervical spinal stenosis  Diabetic polyneuropathy associated with type 2 diabetes mellitus  Anemia, iron deficiency   1.   Her syncope improved with iron infusions. She will follow-up with hematology. 2.   We discussed that the symptoms of the right arm are likely coming from her vehicle spine degenerative changes. I am still uncertain if the gait issues are related to her spinal stenosis as she does not appear to have severe spinal stenosis and there is no myelopathic signal. 3.   If the cervical or arm pain worsens, I would consider starting an agent such as lamotrigine and also consider referring her for epidural steroid injection. If that does not help, we would need to consider referral to surgery. 4.   She will return to see me as needed if she has new or worsening neurologic symptoms.    Jayline Kilburg A. Felecia Shelling, MD, PhD 08/10/3783, 88:50 AM Certified in Neurology, Clinical Neurophysiology, Sleep Medicine, Pain Medicine and Neuroimaging  [facility] [address] [place], [place] [address] [phone]  [phone]  

## 2015-02-12 ENCOUNTER — Telehealth: Payer: Self-pay | Admitting: *Deleted

## 2015-02-12 DIAGNOSIS — D509 Iron deficiency anemia, unspecified: Secondary | ICD-10-CM

## 2015-02-12 NOTE — Telephone Encounter (Signed)
Patient c/o dizziness when she bends over. She had two recent iron transfusions and is scheduled to see [place] in early September. She is wondering if she is still anemic and needs more treatment or to be seen earlier. Spoke to Dr Marin Olp who wants patient to come in for lab work to assess her response to the iron infusions and then determine further need for treatment. Patient is scheduled to come in tomorrow.

## 2015-02-13 ENCOUNTER — Other Ambulatory Visit (HOSPITAL_BASED_OUTPATIENT_CLINIC_OR_DEPARTMENT_OTHER): Payer: Medicare Other

## 2015-02-13 DIAGNOSIS — D509 Iron deficiency anemia, unspecified: Secondary | ICD-10-CM

## 2015-02-13 LAB — CBC WITH DIFFERENTIAL (CANCER CENTER ONLY)
BASO#: 0 10*3/uL (ref 0.0–0.2)
BASO%: 0.6 % (ref 0.0–2.0)
EOS%: 3.9 % (ref 0.0–7.0)
Eosinophils Absolute: 0.3 10*3/uL (ref 0.0–0.5)
HCT: 35.3 % (ref 34.8–46.6)
HEMOGLOBIN: 11.7 g/dL (ref 11.6–15.9)
LYMPH#: 2.1 10*3/uL (ref 0.9–3.3)
LYMPH%: 30.6 % (ref 14.0–48.0)
MCH: 27.1 pg (ref 26.0–34.0)
MCHC: 33.1 g/dL (ref 32.0–36.0)
MCV: 82 fL (ref 81–101)
MONO#: 0.4 10*3/uL (ref 0.1–0.9)
MONO%: 6.2 % (ref 0.0–13.0)
NEUT%: 58.7 % (ref 39.6–80.0)
NEUTROS ABS: 4.1 10*3/uL (ref 1.5–6.5)
Platelets: 209 10*3/uL (ref 145–400)
RBC: 4.31 10*6/uL (ref 3.70–5.32)
RDW: 22.2 % — ABNORMAL HIGH (ref 11.1–15.7)
WBC: 6.9 10*3/uL (ref 3.9–10.0)

## 2015-02-16 ENCOUNTER — Telehealth: Payer: Self-pay | Admitting: Emergency Medicine

## 2015-02-16 LAB — IRON AND TIBC CHCC
%SAT: 27 % (ref 21–57)
Iron: 78 ug/dL (ref 41–142)
TIBC: 288 ug/dL (ref 236–444)
UIBC: 209 ug/dL (ref 120–384)

## 2015-02-16 LAB — COMPREHENSIVE METABOLIC PANEL
ALT: 9 U/L (ref 6–29)
AST: 14 U/L (ref 10–35)
Albumin: 4.3 g/dL (ref 3.6–5.1)
Alkaline Phosphatase: 33 U/L (ref 33–130)
BILIRUBIN TOTAL: 0.4 mg/dL (ref 0.2–1.2)
BUN: 22 mg/dL (ref 7–25)
CO2: 25 mmol/L (ref 20–31)
Calcium: 9.8 mg/dL (ref 8.6–10.4)
Chloride: 99 mmol/L (ref 98–110)
Creatinine, Ser: 1.28 mg/dL — ABNORMAL HIGH (ref 0.60–0.93)
GLUCOSE: 138 mg/dL — AB (ref 65–99)
Potassium: 3.8 mmol/L (ref 3.5–5.3)
SODIUM: 139 mmol/L (ref 135–146)
Total Protein: 6.3 g/dL (ref 6.1–8.1)

## 2015-02-16 LAB — FERRITIN CHCC: Ferritin: 135 ng/ml (ref 9–269)

## 2015-02-16 NOTE — Telephone Encounter (Signed)
Spoke with patient's spouse; informed him that her iron level was ok. Spouse verbalized understanding. Advised to have his wife call this office for any questions or concerns and to return as previously scheduled.

## 2015-02-18 ENCOUNTER — Encounter: Payer: Self-pay | Admitting: Family

## 2015-02-19 ENCOUNTER — Ambulatory Visit (INDEPENDENT_AMBULATORY_CARE_PROVIDER_SITE_OTHER): Payer: Medicare Other | Admitting: Family

## 2015-02-19 ENCOUNTER — Ambulatory Visit (HOSPITAL_COMMUNITY)
Admission: RE | Admit: 2015-02-19 | Discharge: 2015-02-19 | Disposition: A | Discharge status: Home / Self Care | Payer: Medicare Other | Source: Ambulatory Visit | Attending: Family | Admitting: Family

## 2015-02-19 ENCOUNTER — Encounter: Payer: Self-pay | Admitting: Family

## 2015-02-19 VITALS — BP 137/76 | HR 61 | Temp 98.0°F | Resp 16 | Ht 64.0 in | Wt 165.0 lb

## 2015-02-19 DIAGNOSIS — Z9889 Other specified postprocedural states: Secondary | ICD-10-CM | POA: Diagnosis not present

## 2015-02-19 DIAGNOSIS — Z48812 Encounter for surgical aftercare following surgery on the circulatory system: Secondary | ICD-10-CM

## 2015-02-19 DIAGNOSIS — I6523 Occlusion and stenosis of bilateral carotid arteries: Secondary | ICD-10-CM | POA: Diagnosis present

## 2015-02-19 NOTE — Patient Instructions (Signed)
Stroke Prevention Some medical conditions and behaviors are associated with an increased chance of having a stroke. You may prevent a stroke by making healthy choices and managing medical conditions. HOW CAN I REDUCE MY RISK OF HAVING A STROKE?   Stay physically active. Get at least 30 minutes of activity on most or all days.  Do not smoke. It may also be helpful to avoid exposure to secondhand smoke.  Limit alcohol use. Moderate alcohol use is considered to be:  No more than 2 drinks per day for men.  No more than 1 drink per day for nonpregnant women.  Eat healthy foods. This involves:  Eating 5 or more servings of fruits and vegetables a day.  Making dietary changes that address high blood pressure (hypertension), high cholesterol, diabetes, or obesity.  Manage your cholesterol levels.  Making food choices that are high in fiber and low in saturated fat, trans fat, and cholesterol may control cholesterol levels.  Take any prescribed medicines to control cholesterol as directed by your health care provider.  Manage your diabetes.  Controlling your carbohydrate and sugar intake is recommended to manage diabetes.  Take any prescribed medicines to control diabetes as directed by your health care provider.  Control your hypertension.  Making food choices that are low in salt (sodium), saturated fat, trans fat, and cholesterol is recommended to manage hypertension.  Take any prescribed medicines to control hypertension as directed by your health care provider.  Maintain a healthy weight.  Reducing calorie intake and making food choices that are low in sodium, saturated fat, trans fat, and cholesterol are recommended to manage weight.  Stop drug abuse.  Avoid taking birth control pills.  Talk to your health care provider about the risks of taking birth control pills if you are over 35 years old, smoke, get migraines, or have ever had a blood clot.  Get evaluated for sleep  disorders (sleep apnea).  Talk to your health care provider about getting a sleep evaluation if you snore a lot or have excessive sleepiness.  Take medicines only as directed by your health care provider.  For some people, aspirin or blood thinners (anticoagulants) are helpful in reducing the risk of forming abnormal blood clots that can lead to stroke. If you have the irregular heart rhythm of atrial fibrillation, you should be on a blood thinner unless there is a good reason you cannot take them.  Understand all your medicine instructions.  Make sure that other conditions (such as anemia or atherosclerosis) are addressed. SEEK IMMEDIATE MEDICAL CARE IF:   You have sudden weakness or numbness of the face, arm, or leg, especially on one side of the body.  Your face or eyelid droops to one side.  You have sudden confusion.  You have trouble speaking (aphasia) or understanding.  You have sudden trouble seeing in one or both eyes.  You have sudden trouble walking.  You have dizziness.  You have a loss of balance or coordination.  You have a sudden, severe headache with no known cause.  You have new chest pain or an irregular heartbeat. Any of these symptoms may represent a serious problem that is an emergency. Do not wait to see if the symptoms will go away. Get medical help at once. Call your local emergency services (911 in [place] Do not drive yourself to the hospital. Document Released: 07/28/2004 Document Revised: 11/04/2013 Document Reviewed: 12/21/2012 [employer] Patient Information 2015 [employer]. This information is not intended to replace advice given   to you by your health care provider. Make sure you discuss any questions you have with your health care provider.  

## 2015-02-19 NOTE — Progress Notes (Signed)
Established Carotid Patient   History of Present Illness  Sara Holt is a 80 y.o. female patient of Dr. Donnetta Hutching who is status post left CEA in January 2012. She returns today for follow up. She has no history of stroke or TIA.  She has seen a neurologist and a cardiologist, then a hematologist for anemia. She was given iron infusions and feels much improved; has not had any more syncope or pre-syncope since the iron infusions. Pt states it was found that she has nerve compression in her c-spine which is causing tingling and numbness in right fingers; also has L-spine issues.   Dr. Donnetta Hutching saw pt on 03/11/14 and did not feel that her syncope was related to her carotid artery stenosis at that time. In August 2015 she had a very brief syncopal episode after standing up and walking for a while. In 2012, just before the left CEA, she had transient left monocular blindness, denies unilateral facial drooping, denies hemiparesis, denies aphasia symptoms. Saw her opthalmologist after this.  Pt reports New Medical or Surgical History: none Walks on her treadmill at least 15-20 minutes about 3 days/week. She has arthritis, including her right knee.  She denies any cardiac problems, states her last stress test was a few years ago to evaluate left arm and neck pain, pt states was normal, states c-spine vertabra issue found.  Pt smoker: non-smoker Pt states her DM is in good control, states her last A1C was 6.7.  Pt meds include: Statin : Yes ASA: Yes Other anticoagulants/antiplatelets: no  Past Medical History  Diagnosis Date  . Allergy   . Diabetes mellitus     DIET CONTROLLED, NO MEDS  . GERD (gastroesophageal reflux disease)   . Hyperlipidemia   . Hypertension   . Neuromuscular disorder     nerve problems with hands  . Arthritis     OA  . Disc     problems in neck and back  . Sleep apnea   . Carotid artery occlusion   . Asthma   . Osteoporosis   . Anemia     Social  History Social History  Substance Use Topics  . Smoking status: Never Smoker   . Smokeless tobacco: Never Used  . Alcohol Use: 4.2 oz/week    7 Glasses of wine per week    Family History Family History  Problem Relation Age of Onset  . Colon cancer Maternal Grandmother   . Cancer Maternal Grandmother     Colon cancer  . Esophageal cancer Neg Hx   . Stomach cancer Neg Hx   . Rectal cancer Neg Hx   . Diabetes Father   . Pneumonia Father   . Peripheral vascular disease Sister     Surgical History Past Surgical History  Procedure Laterality Date  . Colonoscopy    . Polypectomy    . Carotid artery angioplasty    . Carpel tunnel      both hands  . Carotid endarterectomy  Jan. 16,2012    LEFT cea  . Knee arthroscopy Left 2010  . Joint replacement Left 2011    Knee    Allergies  Allergen Reactions  . Buprenorphine Hcl Nausea And Vomiting    nausea  . Morphine And Related Nausea And Vomiting    nausea  . Sulfa Antibiotics Nausea And Vomiting    "deathly ill"    Current Outpatient Prescriptions  Medication Sig Dispense Refill  . alendronate (FOSAMAX) 70 MG tablet Take 70 mg by mouth  every 7 (seven) days. Take with a full glass of water on an empty stomach.    Marland Kitchen amoxicillin (AMOXIL) 500 MG capsule   1  . aspirin 81 MG tablet Take 81 mg by mouth daily.    . Calcium Carbonate-Vitamin D (CALCIUM PLUS VITAMIN D PO) Take 500 mg by mouth 3 (three) times daily.    . carvedilol (COREG) 6.25 MG tablet Take 6.25 mg by mouth Twice daily.     . cloNIDine (CATAPRES) 0.1 MG tablet Take 0.1 mg by mouth as needed (if b/p >150).    . CYMBALTA 60 MG capsule Take 1 tablet by mouth Daily.     Marland Kitchen desoximetasone (TOPICORT) 0.25 % cream Apply topically continuous as needed.    . hydrochlorothiazide (HYDRODIURIL) 25 MG tablet Take 1 tablet by mouth Daily.     . hydrocortisone (ANUSOL-HC) 25 MG suppository Place 1 suppository (25 mg total) rectally every 12 (twelve) hours. 12 suppository 1  .  JANUMET XR 50-1000 MG TB24     . LORazepam (ATIVAN) 1 MG tablet Take one tablet 30 minutes prior to mri.  Take 2nd tablet with you to appt., to take if needed. **MUST HAVE A DRIVER WHILE UNDER THE INFLUENCE OF THIS MEDICATION** 30 tablet 0  . MYRBETRIQ 25 MG TB24 tablet     . omeprazole (PRILOSEC) 40 MG capsule Take 40 mg by mouth daily.    . ONE TOUCH ULTRA TEST test strip   0  . OVER THE COUNTER MEDICATION Place 1 drop into both eyes at bedtime. dry eyes med    . ramipril (ALTACE) 10 MG capsule Take 10 mg by mouth daily.     . simvastatin (ZOCOR) 10 MG tablet Take 10 mg by mouth Daily.     . traMADol (ULTRAM) 50 MG tablet Take 50 mg by mouth every 6 (six) hours as needed. For pain     No current facility-administered medications for this visit.    Review of Systems : See HPI for pertinent positives and negatives.  Physical Examination  Filed Vitals:   02/19/15 1100 02/19/15 1103  BP: 136/70 137/76  Pulse: 61 61  Temp:  98 F (36.7 C)  TempSrc:  Oral  Resp:  16  Height:  5\' 4"  (1.626 m)  Weight:  165 lb (74.844 kg)  SpO2:  98%   Body mass index is 28.31 kg/(m^2).  General: WDWN female in NAD GAIT: normal Eyes: PERRLA Pulmonary: Non-labored, CTAB, Negative Rales, Negative rhonchi, & Negative wheezing.  Cardiac: regular Rhythm, no detected murmur.  VASCULAR EXAM Carotid Bruits Right Left   Positive Positive    Radial pulses are 2+ palpable and equal. Pedal pulses are palpable.     Gastrointestinal: soft, nontender, BS WNL, no r/g, no masses palpated.  Musculoskeletal: Negative muscle atrophy/wasting. M/S 5/5 throughout, Extremities without ischemic changes.  Neurologic: A&O X 3; Appropriate Affect, Speech is normal CN 2-12 intact Except uvula deviation to the left with palate rise, Pain and light touch intact in  extremities, Motor exam as listed above              Non-Invasive Vascular Imaging CAROTID DUPLEX 02/19/2015   CEREBROVASCULAR DUPLEX EVALUATION    INDICATION: Carotid artery disease    PREVIOUS INTERVENTION(S): Left carotid endarterectomy 07/19/2010    DUPLEX EXAM: Carotid duplex    RIGHT  LEFT  Peak Systolic Velocities (cm/s) End Diastolic Velocities (cm/s) Plaque LOCATION Peak Systolic Velocities (cm/s) End Diastolic Velocities (cm/s) Plaque  56 10 - CCA  PROXIMAL 94 20 HT  86 15 - CCA MID 104 21 HT  83 13 - CCA DISTAL 82 13 -  149 16 HT ECA 313 24 HT  342 76 CP ICA PROXIMAL 97 28 -  192 34 - ICA MID 112 34 -  91 21 - ICA DISTAL 118 29 -    3.9 ICA / CCA Ratio (PSV) N/A  Antegrade Vertebral Flow Antegrade  696 Brachial Systolic Pressure (mmHg) 295  Triphasic Brachial Artery Waveforms Triphasic    Plaque Morphology:  HM = Homogeneous, HT = Heterogeneous, CP = Calcific Plaque, SP = Smooth Plaque, IP = Irregular Plaque     ADDITIONAL FINDINGS:     IMPRESSION: 1. 60 - 79% right internal carotid artery stenosis. 2. Patent left carotid endarterectomy site with no evidence for restenosis. 3. Left external carotid artery stenosis.    Compared to the previous exam:  No significant change      Assessment: Sara Holt is a 80 y.o. female who is status post left CEA in January 2012. She has no history of stroke or TIA. She has had neuro, cardiac, and hemology evaluation re her syncope. She was found to have iron deficiency anemia, given iron transfusions, and has had no more syncopal or pre-syncopal episodes.  Today's carotid Duplex suggests 60 - 79% right internal carotid artery stenosis and patent left carotid endarterectomy site with no evidence for restenosis. No significant change from 08/21/14.    Plan: Follow-up in 6 months with Carotid Duplex.   I discussed in depth with the patient the nature of atherosclerosis, and emphasized the importance of maximal  medical management including strict control of blood pressure, blood glucose, and lipid levels, obtaining regular exercise, and continued cessation of smoking.  The patient is aware that without maximal medical management the underlying atherosclerotic disease process will progress, limiting the benefit of any interventions. The patient was given information about stroke prevention and what symptoms should prompt the patient to seek immediate medical care. Thank you for allowing [place] to participate in this patient's care.  Clemon Chambers, RN, MSN, FNP-C [facility] Office: [phone]  Clinic Physician: Oneida Alar  02/19/2015 11:25 AM

## 2015-02-20 NOTE — Addendum Note (Signed)
Addended by: Mena Goes on: 02/20/2015 05:16 PM   Modules accepted: Orders

## 2015-03-12 ENCOUNTER — Encounter: Payer: Self-pay | Admitting: Family

## 2015-03-12 ENCOUNTER — Ambulatory Visit (HOSPITAL_BASED_OUTPATIENT_CLINIC_OR_DEPARTMENT_OTHER): Payer: Medicare Other | Admitting: Family

## 2015-03-12 ENCOUNTER — Other Ambulatory Visit (HOSPITAL_BASED_OUTPATIENT_CLINIC_OR_DEPARTMENT_OTHER): Payer: Medicare Other

## 2015-03-12 ENCOUNTER — Ambulatory Visit (HOSPITAL_BASED_OUTPATIENT_CLINIC_OR_DEPARTMENT_OTHER): Payer: Medicare Other

## 2015-03-12 VITALS — BP 121/56 | HR 73 | Temp 98.8°F | Resp 16 | Ht 64.0 in | Wt 165.0 lb

## 2015-03-12 DIAGNOSIS — I6523 Occlusion and stenosis of bilateral carotid arteries: Secondary | ICD-10-CM

## 2015-03-12 DIAGNOSIS — D509 Iron deficiency anemia, unspecified: Secondary | ICD-10-CM

## 2015-03-12 DIAGNOSIS — Z23 Encounter for immunization: Secondary | ICD-10-CM | POA: Diagnosis present

## 2015-03-12 DIAGNOSIS — D649 Anemia, unspecified: Secondary | ICD-10-CM

## 2015-03-12 LAB — CBC WITH DIFFERENTIAL (CANCER CENTER ONLY)
BASO#: 0.1 10*3/uL (ref 0.0–0.2)
BASO%: 0.7 % (ref 0.0–2.0)
EOS ABS: 0.2 10*3/uL (ref 0.0–0.5)
EOS%: 3 % (ref 0.0–7.0)
HCT: 38.4 % (ref 34.8–46.6)
HGB: 12.9 g/dL (ref 11.6–15.9)
LYMPH#: 2.3 10*3/uL (ref 0.9–3.3)
LYMPH%: 30.6 % (ref 14.0–48.0)
MCH: 28.2 pg (ref 26.0–34.0)
MCHC: 33.6 g/dL (ref 32.0–36.0)
MCV: 84 fL (ref 81–101)
MONO#: 0.4 10*3/uL (ref 0.1–0.9)
MONO%: 5.7 % (ref 0.0–13.0)
NEUT#: 4.5 10*3/uL (ref 1.5–6.5)
NEUT%: 60 % (ref 39.6–80.0)
Platelets: 259 10*3/uL (ref 145–400)
RBC: 4.58 10*6/uL (ref 3.70–5.32)
RDW: 18.8 % — ABNORMAL HIGH (ref 11.1–15.7)
WBC: 7.4 10*3/uL (ref 3.9–10.0)

## 2015-03-12 LAB — COMPREHENSIVE METABOLIC PANEL
ALBUMIN: 4.4 g/dL (ref 3.6–5.1)
ALT: 10 U/L (ref 6–29)
AST: 13 U/L (ref 10–35)
Alkaline Phosphatase: 36 U/L (ref 33–130)
BUN: 24 mg/dL (ref 7–25)
CO2: 25 mmol/L (ref 20–31)
CREATININE: 1.19 mg/dL — AB (ref 0.60–0.93)
Calcium: 10.1 mg/dL (ref 8.6–10.4)
Chloride: 100 mmol/L (ref 98–110)
Glucose, Bld: 155 mg/dL — ABNORMAL HIGH (ref 65–99)
Potassium: 4 mmol/L (ref 3.5–5.3)
SODIUM: 138 mmol/L (ref 135–146)
TOTAL PROTEIN: 6.6 g/dL (ref 6.1–8.1)
Total Bilirubin: 0.4 mg/dL (ref 0.2–1.2)

## 2015-03-12 MED ORDER — INFLUENZA VAC SPLIT QUAD 0.5 ML IM SUSY
0.5000 mL | PREFILLED_SYRINGE | Freq: Once | INTRAMUSCULAR | Status: AC
  Administered 2015-03-12: 0.5 mL via INTRAMUSCULAR
  Filled 2015-03-12: qty 0.5

## 2015-03-12 NOTE — Patient Instructions (Signed)

## 2015-03-12 NOTE — Progress Notes (Signed)
Hematology and Oncology Follow Up Visit  Sara Holt [id number] [birth year] 80 y.o. 03/12/2015   Principle Diagnosis:  1. Iron deficiency anemia 2. Anemia of renal insufficiency  Current Therapy:   IV iron as indicated     Interim History:  Sara Holt is here today for a follow-up. She is feeling much better since receiving 2 doses of Feraheme in July. Her fatigue has resolved and she has no other complaints at this time.  Her blood sugars have been running high. She states that she knows this is due to her diet. She is going to try cutting out carbohydrates and exercising.  She has a good appetite and is staying well hydrated. Her weight is stable.  She has had no fever, chills, n/v, cough, rash, dizziness, SOB, chest pain, palpitations, abdominal pain, changes in bowel or bladder habits. She has not noticed any blood in her urine or stool.  She has some ankle swelling at times. This comes and goes and is not a new issue for her. She has a pinched nerve in her back that has caused her to have numbness and tingling in the right hand.  Medications:    Medication List       This list is accurate as of: 03/12/15  1:31 PM.  Always use your most recent med list.               alendronate 70 MG tablet  Commonly known as:  FOSAMAX  Take 70 mg by mouth every 7 (seven) days. Take with a full glass of water on an empty stomach.     amoxicillin 500 MG capsule  Commonly known as:  AMOXIL     aspirin 81 MG tablet  Take 81 mg by mouth daily.     CALCIUM PLUS VITAMIN D PO  Take 500 mg by mouth 3 (three) times daily.     carvedilol 6.25 MG tablet  Commonly known as:  COREG  Take 6.25 mg by mouth Twice daily.     cloNIDine 0.1 MG tablet  Commonly known as:  CATAPRES  Take 0.1 mg by mouth as needed (if b/p >150).     CYMBALTA 60 MG capsule  Generic drug:  DULoxetine  Take 1 tablet by mouth Daily.     desoximetasone 0.25 % cream  Commonly known as:  TOPICORT  Apply  topically continuous as needed.     hydrochlorothiazide 25 MG tablet  Commonly known as:  HYDRODIURIL  Take 1 tablet by mouth Daily.     hydrocortisone 25 MG suppository  Commonly known as:  ANUSOL-HC  Place 1 suppository (25 mg total) rectally every 12 (twelve) hours.     JANUMET XR 50-1000 MG Tb24  Generic drug:  SitaGLIPtin-MetFORMIN HCl     LORazepam 1 MG tablet  Commonly known as:  ATIVAN  Take one tablet 30 minutes prior to mri.  Take 2nd tablet with you to appt., to take if needed. **MUST HAVE A DRIVER WHILE UNDER THE INFLUENCE OF THIS MEDICATION**     MYRBETRIQ 25 MG Tb24 tablet  Generic drug:  mirabegron ER     omeprazole 40 MG capsule  Commonly known as:  PRILOSEC  Take 40 mg by mouth daily.     ONE TOUCH ULTRA TEST test strip  Generic drug:  glucose blood     OVER THE COUNTER MEDICATION  Place 1 drop into both eyes at bedtime. dry eyes med     ramipril 10 MG capsule  Commonly  known as:  ALTACE  Take 10 mg by mouth daily.     simvastatin 10 MG tablet  Commonly known as:  ZOCOR  Take 10 mg by mouth Daily.     traMADol 50 MG tablet  Commonly known as:  ULTRAM  Take 50 mg by mouth every 6 (six) hours as needed. For pain        Allergies:  Allergies  Allergen Reactions  . Buprenorphine Hcl Nausea And Vomiting    nausea  . Morphine And Related Nausea And Vomiting    nausea  . Sulfa Antibiotics Nausea And Vomiting    "deathly ill"    Past Medical History, Surgical history, Social history, and Family History were reviewed and updated.  Review of Systems: All other 10 point review of systems is negative.   Physical Exam:  height is 5\' 4"  (1.626 m) and weight is 165 lb (74.844 kg). Her oral temperature is 98.8 F (37.1 C). Her blood pressure is 121/56 and her pulse is 73. Her respiration is 16.   Wt Readings from Last 3 Encounters:  03/12/15 165 lb (74.844 kg)  02/19/15 165 lb (74.844 kg)  02/09/15 164 lb 12.8 oz (74.753 kg)    Ocular: Sclerae  unicteric, pupils equal, round and reactive to light Ear-nose-throat: Oropharynx clear, dentition fair Lymphatic: No cervical or supraclavicular adenopathy Lungs no rales or rhonchi, good excursion bilaterally Heart regular rate and rhythm, no murmur appreciated Abd soft, nontender, positive bowel sounds MSK no focal spinal tenderness, no joint edema Neuro: non-focal, well-oriented, appropriate affect Breasts: Deferred  Lab Results  Component Value Date   WBC 7.4 03/12/2015   HGB 12.9 03/12/2015   HCT 38.4 03/12/2015   MCV 84 03/12/2015   PLT 259 03/12/2015   Lab Results  Component Value Date   FERRITIN 135 02/13/2015   IRON 78 02/13/2015   TIBC 288 02/13/2015   UIBC 209 02/13/2015   IRONPCTSAT 27 02/13/2015   Lab Results  Component Value Date   RETICCTPCT 1.5 01/12/2015   RBC 4.58 03/12/2015   RETICCTABS 63.2 01/12/2015   No results found for: KPAFRELGTCHN, LAMBDASER, KAPLAMBRATIO No results found for: IGGSERUM, IGA, IGMSERUM No results found for: Odetta Pink, SPEI   Chemistry      Component Value Date/Time   NA 139 02/13/2015 1144   NA 137 01/12/2015 1015   K 3.8 02/13/2015 1144   K 4.2 01/12/2015 1015   CL 99 02/13/2015 1144   CL 98 01/12/2015 1015   CO2 25 02/13/2015 1144   CO2 30 01/12/2015 1015   BUN 22 02/13/2015 1144   BUN 23* 01/12/2015 1015   CREATININE 1.28* 02/13/2015 1144   CREATININE 1.1 01/12/2015 1015      Component Value Date/Time   CALCIUM 9.8 02/13/2015 1144   CALCIUM 10.0 01/12/2015 1015   ALKPHOS 33 02/13/2015 1144   ALKPHOS 36 01/12/2015 1015   AST 14 02/13/2015 1144   AST 19 01/12/2015 1015   ALT 9 02/13/2015 1144   ALT 16 01/12/2015 1015   BILITOT 0.4 02/13/2015 1144   BILITOT 0.90 01/12/2015 1015     Impression and Plan: Sara Holt is a very pleasant 80 yo caucasian female with multifactorial anemia (iron deficiency and renal insufficiency). She has responded nicely to  the 2 doses of Feraheme she received in July. She is asymptomatic at this time.  Her Hgb is now up to 12.9 with an MCV of 84. We will see what her  iron studies show. If she happens to need another dose of Feraheme we will bring her back in early next week.  We will plan to see her back in 2 months for labs and follow-up.  She knows to contact us with any questions or concerns. We can certainly see her sooner if need be.   Eliezer Bottom, NP 9/8/20161:31 PM

## 2015-03-13 LAB — IRON AND TIBC CHCC
%SAT: 21 % (ref 21–57)
IRON: 68 ug/dL (ref 41–142)
TIBC: 328 ug/dL (ref 236–444)
UIBC: 259 ug/dL (ref 120–384)

## 2015-03-13 LAB — FERRITIN CHCC: Ferritin: 32 ng/ml (ref 9–269)

## 2015-05-15 ENCOUNTER — Encounter: Payer: Self-pay | Admitting: Hematology & Oncology

## 2015-05-15 ENCOUNTER — Other Ambulatory Visit: Payer: Self-pay | Admitting: Hematology & Oncology

## 2015-05-15 ENCOUNTER — Ambulatory Visit (HOSPITAL_BASED_OUTPATIENT_CLINIC_OR_DEPARTMENT_OTHER): Payer: Medicare Other | Admitting: Hematology & Oncology

## 2015-05-15 ENCOUNTER — Other Ambulatory Visit (HOSPITAL_BASED_OUTPATIENT_CLINIC_OR_DEPARTMENT_OTHER): Payer: Medicare Other

## 2015-05-15 VITALS — BP 150/48 | HR 69 | Temp 98.2°F | Wt 167.0 lb

## 2015-05-15 DIAGNOSIS — D500 Iron deficiency anemia secondary to blood loss (chronic): Secondary | ICD-10-CM

## 2015-05-15 DIAGNOSIS — J0101 Acute recurrent maxillary sinusitis: Secondary | ICD-10-CM

## 2015-05-15 DIAGNOSIS — D509 Iron deficiency anemia, unspecified: Secondary | ICD-10-CM

## 2015-05-15 LAB — CBC WITH DIFFERENTIAL (CANCER CENTER ONLY)
BASO#: 0 10*3/uL (ref 0.0–0.2)
BASO%: 0.4 % (ref 0.0–2.0)
EOS%: 3.1 % (ref 0.0–7.0)
Eosinophils Absolute: 0.3 10*3/uL (ref 0.0–0.5)
HEMATOCRIT: 37.5 % (ref 34.8–46.6)
HEMOGLOBIN: 12.6 g/dL (ref 11.6–15.9)
LYMPH#: 1.6 10*3/uL (ref 0.9–3.3)
LYMPH%: 19.2 % (ref 14.0–48.0)
MCH: 29.3 pg (ref 26.0–34.0)
MCHC: 33.6 g/dL (ref 32.0–36.0)
MCV: 87 fL (ref 81–101)
MONO#: 0.5 10*3/uL (ref 0.1–0.9)
MONO%: 6.6 % (ref 0.0–13.0)
NEUT%: 70.7 % (ref 39.6–80.0)
NEUTROS ABS: 5.8 10*3/uL (ref 1.5–6.5)
PLATELETS: 213 10*3/uL (ref 145–400)
RBC: 4.3 10*6/uL (ref 3.70–5.32)
RDW: 13.2 % (ref 11.1–15.7)
WBC: 8.1 10*3/uL (ref 3.9–10.0)

## 2015-05-15 LAB — FERRITIN CHCC: FERRITIN: 23 ng/mL (ref 9–269)

## 2015-05-15 LAB — IRON AND TIBC CHCC
%SAT: 17 % — AB (ref 21–57)
Iron: 56 ug/dL (ref 41–142)
TIBC: 339 ug/dL (ref 236–444)
UIBC: 283 ug/dL (ref 120–384)

## 2015-05-15 LAB — COMPREHENSIVE METABOLIC PANEL (CC13)
ALT: 12 U/L (ref 0–55)
ANION GAP: 12 meq/L — AB (ref 3–11)
AST: 13 U/L (ref 5–34)
Albumin: 4 g/dL (ref 3.5–5.0)
Alkaline Phosphatase: 39 U/L — ABNORMAL LOW (ref 40–150)
BUN: 19.2 mg/dL (ref 7.0–26.0)
CALCIUM: 9.8 mg/dL (ref 8.4–10.4)
CHLORIDE: 99 meq/L (ref 98–109)
CO2: 28 mEq/L (ref 22–29)
CREATININE: 1.2 mg/dL — AB (ref 0.6–1.1)
EGFR: 44 mL/min/{1.73_m2} — ABNORMAL LOW (ref >90)
GLUCOSE: 243 mg/dL — AB (ref 70–140)
Potassium: 3.8 mEq/L (ref 3.5–5.1)
Sodium: 139 mEq/L (ref 136–145)
Total Bilirubin: 0.57 mg/dL (ref 0.20–1.20)
Total Protein: 6.6 g/dL (ref 6.4–8.3)

## 2015-05-15 MED ORDER — AZITHROMYCIN 250 MG PO TABS: ORAL_TABLET | ORAL | Status: DC

## 2015-05-15 NOTE — Progress Notes (Signed)
Hematology and Oncology Follow Up Visit  HEIDEMARIE NATHANSON ON:[id number] 1934/09/05 80 y.o. 05/15/2015   Principle Diagnosis:  1. Iron deficiency anemia 2. Anemia of renal insufficiency  Current Therapy:   IV iron as indicated     Interim History:  Ms. Moros is here today for a follow-up. She is feeling a little bit tired. Her iron studies done back in September showed a ferritin of 32 with iron saturation of 21%.  She's had no issues with bleeding. Her appetite has been okay. She's had no nausea or vomiting. She's had no leg swelling.  She has had some back issues.  She has a pinched nerve in her back that has caused her to have numbness and tingling in the right hand.  She's had no change in bowel or bladder habits.  She has not noted any rashes. She has had no fever. She has had no headache. She has had no recent travel.  She has a performance status of ECOG 1.  Medications:    Medication List       This list is accurate as of: 05/15/15  5:04 PM.  Always use your most recent med list.               alendronate 70 MG tablet  Commonly known as:  FOSAMAX  Take 70 mg by mouth every 7 (seven) days. Take with a full glass of water on an empty stomach.     aspirin 81 MG tablet  Take 81 mg by mouth daily.     azithromycin 250 MG tablet  Commonly known as:  ZITHROMAX Z-PAK  Take as directed.     CALCIUM PLUS VITAMIN D PO  Take 500 mg by mouth 3 (three) times daily.     carvedilol 6.25 MG tablet  Commonly known as:  COREG  Take 6.25 mg by mouth Twice daily.     cloNIDine 0.1 MG tablet  Commonly known as:  CATAPRES  Take 0.1 mg by mouth as needed (if b/p >150).     CYMBALTA 60 MG capsule  Generic drug:  DULoxetine  Take 1 tablet by mouth Daily.     desoximetasone 0.25 % cream  Commonly known as:  TOPICORT  Apply topically continuous as needed.     hydrochlorothiazide 25 MG tablet  Commonly known as:  HYDRODIURIL  Take 1 tablet by mouth Daily.     hydrocortisone 25 MG suppository  Commonly known as:  ANUSOL-HC  Place 1 suppository (25 mg total) rectally every 12 (twelve) hours.     JANUMET XR 50-1000 MG Tb24  Generic drug:  SitaGLIPtin-MetFORMIN HCl     LORazepam 1 MG tablet  Commonly known as:  ATIVAN  Take one tablet 30 minutes prior to mri.  Take 2nd tablet with you to appt., to take if needed. **MUST HAVE A DRIVER WHILE UNDER THE INFLUENCE OF THIS MEDICATION**     MYRBETRIQ 25 MG Tb24 tablet  Generic drug:  mirabegron ER     omeprazole 40 MG capsule  Commonly known as:  PRILOSEC  Take 40 mg by mouth daily.     ONE TOUCH ULTRA TEST test strip  Generic drug:  glucose blood     OVER THE COUNTER MEDICATION  Place 1 drop into both eyes at bedtime. dry eyes med     ramipril 10 MG capsule  Commonly known as:  ALTACE  Take 10 mg by mouth daily.     simvastatin 10 MG tablet  Commonly known as:  ZOCOR  Take 10 mg by mouth Daily.     traMADol 50 MG tablet  Commonly known as:  ULTRAM  Take 50 mg by mouth every 6 (six) hours as needed. For pain        Allergies:  Allergies  Allergen Reactions  . Buprenorphine Hcl Nausea And Vomiting    nausea  . Morphine And Related Nausea And Vomiting    nausea  . Sulfa Antibiotics Nausea And Vomiting    "deathly ill"    Past Medical History, Surgical history, Social history, and Family History were reviewed and updated.  Review of Systems: All other 10 point review of systems is negative.   Physical Exam:  weight is 167 lb (75.751 kg). Her oral temperature is 98.2 F (36.8 C). Her blood pressure is 150/48 and her pulse is 69.   Wt Readings from Last 3 Encounters:  05/15/15 167 lb (75.751 kg)  03/12/15 165 lb (74.844 kg)  02/19/15 165 lb (74.844 kg)    Well-developed well-nourished white female in no obvious distress. Head and neck exam shows no ocular or oral lesions. There are no palpable cervical or supraclavicular lymph nodes. Lungs are clear. Cardiac exam regular  rate and rhythm with no murmurs, rubs or bruits. Abdomen is soft. She has good bowel sounds. There is no fluid wave. There is no palpable liver or spleen tip. Back exam shows no tenderness over the spine, ribs or hips. Extremities shows no clubbing, cyanosis or edema. Skin exam shows no rashes, ecchymoses or petechia.  Lab Results  Component Value Date   WBC 8.1 05/15/2015   HGB 12.6 05/15/2015   HCT 37.5 05/15/2015   MCV 87 05/15/2015   PLT 213 05/15/2015   Lab Results  Component Value Date   FERRITIN 23 05/15/2015   IRON 56 05/15/2015   TIBC 339 05/15/2015   UIBC 283 05/15/2015   IRONPCTSAT 17* 05/15/2015   Lab Results  Component Value Date   RETICCTPCT 1.5 01/12/2015   RBC 4.30 05/15/2015   RETICCTABS 63.2 01/12/2015   No results found for: KPAFRELGTCHN, LAMBDASER, KAPLAMBRATIO No results found for: IGGSERUM, IGA, IGMSERUM No results found for: Kathrynn Ducking, MSPIKE, SPEI   Chemistry      Component Value Date/Time   NA 139 05/15/2015 0950   NA 138 03/12/2015 1204   NA 137 01/12/2015 1015   K 3.8 05/15/2015 0950   K 4.0 03/12/2015 1204   K 4.2 01/12/2015 1015   CL 100 03/12/2015 1204   CL 98 01/12/2015 1015   CO2 28 05/15/2015 0950   CO2 25 03/12/2015 1204   CO2 30 01/12/2015 1015   BUN 19.2 05/15/2015 0950   BUN 24 03/12/2015 1204   BUN 23* 01/12/2015 1015   CREATININE 1.2* 05/15/2015 0950   CREATININE 1.19* 03/12/2015 1204   CREATININE 1.1 01/12/2015 1015      Component Value Date/Time   CALCIUM 9.8 05/15/2015 0950   CALCIUM 10.1 03/12/2015 1204   CALCIUM 10.0 01/12/2015 1015   ALKPHOS 39* 05/15/2015 0950   ALKPHOS 36 03/12/2015 1204   ALKPHOS 36 01/12/2015 1015   AST 13 05/15/2015 0950   AST 13 03/12/2015 1204   AST 19 01/12/2015 1015   ALT 12 05/15/2015 0950   ALT 10 03/12/2015 1204   ALT 16 01/12/2015 1015   BILITOT 0.57 05/15/2015 0950   BILITOT 0.4 03/12/2015 1204   BILITOT 0.90 01/12/2015 1015      Impression and Plan: Ms. Brumbley  is a very pleasant 80 yo white female. She has some iron deficiency anemia. She has lower iron levels today. Her ferritin is 23 with iron saturation of 17%. I think we probably will have to give her some iron infusions.  I think she also probably is not absorbing iron too well. She has diabetes.  I will go ahead and plan for iron in the next couple weeks. I will plan  to see her back in another couple months.   [name], MD 11/11/20165:04 PM

## 2015-05-18 ENCOUNTER — Telehealth: Payer: Self-pay | Admitting: *Deleted

## 2015-05-18 ENCOUNTER — Other Ambulatory Visit: Payer: Self-pay | Admitting: *Deleted

## 2015-05-18 DIAGNOSIS — D509 Iron deficiency anemia, unspecified: Secondary | ICD-10-CM

## 2015-05-18 NOTE — Telephone Encounter (Addendum)
Patient aware of results. Appointments scheduled.   ----- Message from [name], MD sent at 05/15/2015  5:10 PM EST ----- Please call her and tell her that the iron is dropping again!! She needs 2 doses of Feraheme.  Thanks!!!  Laurey Arrow

## 2015-05-19 ENCOUNTER — Ambulatory Visit (HOSPITAL_BASED_OUTPATIENT_CLINIC_OR_DEPARTMENT_OTHER): Payer: Medicare Other

## 2015-05-19 ENCOUNTER — Other Ambulatory Visit: Payer: Self-pay | Admitting: Family

## 2015-05-19 VITALS — BP 148/52 | HR 70 | Temp 97.6°F

## 2015-05-19 DIAGNOSIS — D509 Iron deficiency anemia, unspecified: Secondary | ICD-10-CM

## 2015-05-19 MED ORDER — HYDROCOD POLST-CPM POLST ER 10-8 MG/5ML PO SUER: 5.0000 mL | Freq: Two times a day (BID) | ORAL | Status: DC | PRN

## 2015-05-19 MED ORDER — SODIUM CHLORIDE 0.9 % IV SOLN
Freq: Once | INTRAVENOUS | Status: AC
  Administered 2015-05-19: 15:00:00 via INTRAVENOUS

## 2015-05-19 MED ORDER — SODIUM CHLORIDE 0.9 % IV SOLN
510.0000 mg | Freq: Once | INTRAVENOUS | Status: AC
  Administered 2015-05-19: 510 mg via INTRAVENOUS
  Filled 2015-05-19: qty 17

## 2015-05-19 NOTE — Patient Instructions (Signed)

## 2015-05-20 ENCOUNTER — Other Ambulatory Visit: Payer: Self-pay | Admitting: *Deleted

## 2015-05-20 DIAGNOSIS — J0101 Acute recurrent maxillary sinusitis: Secondary | ICD-10-CM

## 2015-05-20 MED ORDER — HYDROCOD POLST-CPM POLST ER 10-8 MG/5ML PO SUER: 5.0000 mL | Freq: Two times a day (BID) | ORAL | Status: DC | PRN

## 2015-05-20 MED ORDER — AZITHROMYCIN 250 MG PO TABS: ORAL_TABLET | ORAL | Status: DC

## 2015-05-22 ENCOUNTER — Ambulatory Visit: Payer: Medicare Other

## 2015-05-27 ENCOUNTER — Ambulatory Visit (HOSPITAL_BASED_OUTPATIENT_CLINIC_OR_DEPARTMENT_OTHER): Payer: Medicare Other

## 2015-05-27 VITALS — BP 151/58 | HR 65 | Temp 97.9°F | Resp 18

## 2015-05-27 DIAGNOSIS — D509 Iron deficiency anemia, unspecified: Secondary | ICD-10-CM

## 2015-05-27 MED ORDER — SODIUM CHLORIDE 0.9 % IV SOLN
INTRAVENOUS | Status: DC
  Administered 2015-05-27: 09:00:00 via INTRAVENOUS

## 2015-05-27 MED ORDER — SODIUM CHLORIDE 0.9 % IV SOLN
510.0000 mg | Freq: Once | INTRAVENOUS | Status: AC
  Administered 2015-05-27: 510 mg via INTRAVENOUS
  Filled 2015-05-27: qty 17

## 2015-05-27 NOTE — Patient Instructions (Signed)

## 2015-06-01 ENCOUNTER — Other Ambulatory Visit: Payer: Self-pay | Admitting: Internal Medicine

## 2015-06-01 ENCOUNTER — Ambulatory Visit
Admission: RE | Admit: 2015-06-01 | Discharge: 2015-06-01 | Disposition: A | Discharge status: Home / Self Care | Payer: Medicare Other | Source: Ambulatory Visit | Attending: Internal Medicine | Admitting: Internal Medicine

## 2015-06-01 DIAGNOSIS — R05 Cough: Secondary | ICD-10-CM

## 2015-06-01 DIAGNOSIS — R059 Cough, unspecified: Secondary | ICD-10-CM

## 2015-08-07 ENCOUNTER — Ambulatory Visit
Admission: RE | Admit: 2015-08-07 | Discharge: 2015-08-07 | Disposition: A | Discharge status: Home / Self Care | Payer: Medicare Other | Source: Ambulatory Visit | Attending: Internal Medicine | Admitting: Internal Medicine

## 2015-08-07 ENCOUNTER — Other Ambulatory Visit: Payer: Self-pay | Admitting: Internal Medicine

## 2015-08-07 DIAGNOSIS — R059 Cough, unspecified: Secondary | ICD-10-CM

## 2015-08-07 DIAGNOSIS — R05 Cough: Secondary | ICD-10-CM

## 2015-08-17 ENCOUNTER — Encounter: Payer: Self-pay | Admitting: Family

## 2015-08-17 ENCOUNTER — Other Ambulatory Visit (HOSPITAL_BASED_OUTPATIENT_CLINIC_OR_DEPARTMENT_OTHER): Payer: Medicare Other

## 2015-08-17 ENCOUNTER — Other Ambulatory Visit: Payer: Self-pay | Admitting: *Deleted

## 2015-08-17 ENCOUNTER — Ambulatory Visit (HOSPITAL_BASED_OUTPATIENT_CLINIC_OR_DEPARTMENT_OTHER): Payer: Medicare Other | Admitting: Family

## 2015-08-17 VITALS — BP 127/69 | HR 70 | Temp 97.8°F | Resp 16 | Ht 64.0 in | Wt 162.0 lb

## 2015-08-17 DIAGNOSIS — N189 Chronic kidney disease, unspecified: Secondary | ICD-10-CM

## 2015-08-17 DIAGNOSIS — R059 Cough, unspecified: Secondary | ICD-10-CM

## 2015-08-17 DIAGNOSIS — N289 Disorder of kidney and ureter, unspecified: Secondary | ICD-10-CM | POA: Diagnosis not present

## 2015-08-17 DIAGNOSIS — D509 Iron deficiency anemia, unspecified: Secondary | ICD-10-CM | POA: Diagnosis not present

## 2015-08-17 DIAGNOSIS — D500 Iron deficiency anemia secondary to blood loss (chronic): Secondary | ICD-10-CM

## 2015-08-17 DIAGNOSIS — R05 Cough: Secondary | ICD-10-CM

## 2015-08-17 DIAGNOSIS — D631 Anemia in chronic kidney disease: Secondary | ICD-10-CM | POA: Insufficient documentation

## 2015-08-17 DIAGNOSIS — J0101 Acute recurrent maxillary sinusitis: Secondary | ICD-10-CM

## 2015-08-17 LAB — CBC WITH DIFFERENTIAL (CANCER CENTER ONLY)
BASO#: 0 10*3/uL (ref 0.0–0.2)
BASO%: 0.4 % (ref 0.0–2.0)
EOS%: 2.8 % (ref 0.0–7.0)
Eosinophils Absolute: 0.2 10*3/uL (ref 0.0–0.5)
HCT: 39.2 % (ref 34.8–46.6)
HGB: 13.8 g/dL (ref 11.6–15.9)
LYMPH#: 1.6 10*3/uL (ref 0.9–3.3)
LYMPH%: 23.4 % (ref 14.0–48.0)
MCH: 30.9 pg (ref 26.0–34.0)
MCHC: 35.2 g/dL (ref 32.0–36.0)
MCV: 88 fL (ref 81–101)
MONO#: 0.3 10*3/uL (ref 0.1–0.9)
MONO%: 4.9 % (ref 0.0–13.0)
NEUT#: 4.6 10*3/uL (ref 1.5–6.5)
NEUT%: 68.5 % (ref 39.6–80.0)
PLATELETS: 243 10*3/uL (ref 145–400)
RBC: 4.47 10*6/uL (ref 3.70–5.32)
RDW: 12.9 % (ref 11.1–15.7)
WBC: 6.7 10*3/uL (ref 3.9–10.0)

## 2015-08-17 LAB — IRON AND TIBC
%SAT: 36 % (ref 21–57)
IRON: 97 ug/dL (ref 41–142)
TIBC: 269 ug/dL (ref 236–444)
UIBC: 171 ug/dL (ref 120–384)

## 2015-08-17 LAB — FERRITIN: FERRITIN: 163 ng/mL (ref 9–269)

## 2015-08-17 MED ORDER — HYDROCOD POLST-CPM POLST ER 10-8 MG/5ML PO SUER: 5.0000 mL | Freq: Two times a day (BID) | ORAL | Status: DC | PRN

## 2015-08-17 NOTE — Progress Notes (Signed)
Hematology and Oncology Follow Up Visit  Sara Holt ON:[id number] [birth year] 81 y.o. 08/17/2015   Principle Diagnosis:  1. Iron deficiency anemia 2. Anemia of renal insufficiency  Current Therapy:   IV iron as indicated     Interim History:  Sara Holt is here today for a follow-up. She still has a cough and is feeling tired today. It is slowly improving but she would like a refill on the cough syrup we gave her at her last appointment. She received 2 doses of Feraheme in November. Her iron saturation at that time was 17% with a ferritin of 23. Her hgb today is up to 13.8 with an MCV of 88.  She denies fever, chills, n/v, cough, rash, dizziness, SOB, chest pain, palpitations, abdominal pain or changes in bowel or bladder habits. She is eating well and states that her blood sugars have been fairly well controlled. She is staying well hydrated and her weight is stable.   No swelling or tenderness in her extremities at this time. She still has numbness and tingling in the right hand due to a pinched nerve.   Medications:    Medication List       This list is accurate as of: 08/17/15 11:44 AM.  Always use your most recent med list.               alendronate 70 MG tablet  Commonly known as:  FOSAMAX  Take 70 mg by mouth every 7 (seven) days. Take with a full glass of water on an empty stomach.     aspirin 81 MG tablet  Take 81 mg by mouth daily.     azithromycin 250 MG tablet  Commonly known as:  ZITHROMAX Z-PAK  Take as directed.     CALCIUM PLUS VITAMIN D PO  Take 500 mg by mouth 3 (three) times daily.     carvedilol 6.25 MG tablet  Commonly known as:  COREG  Take 6.25 mg by mouth Twice daily.     chlorpheniramine-HYDROcodone 10-8 MG/5ML Suer  Commonly known as:  TUSSIONEX  Take 5 mLs by mouth every 12 (twelve) hours as needed for cough.     cloNIDine 0.1 MG tablet  Commonly known as:  CATAPRES  Take 0.1 mg by mouth as needed (if b/p >150).     CYMBALTA 60  MG capsule  Generic drug:  DULoxetine  Take 1 tablet by mouth Daily.     desoximetasone 0.25 % cream  Commonly known as:  TOPICORT  Apply topically continuous as needed.     hydrochlorothiazide 25 MG tablet  Commonly known as:  HYDRODIURIL  Take 1 tablet by mouth Daily.     hydrocortisone 25 MG suppository  Commonly known as:  ANUSOL-HC  Place 1 suppository (25 mg total) rectally every 12 (twelve) hours.     JANUMET XR 50-1000 MG Tb24  Generic drug:  SitaGLIPtin-MetFORMIN HCl     LORazepam 1 MG tablet  Commonly known as:  ATIVAN  Take one tablet 30 minutes prior to mri.  Take 2nd tablet with you to appt., to take if needed. **MUST HAVE A DRIVER WHILE UNDER THE INFLUENCE OF THIS MEDICATION**     MYRBETRIQ 25 MG Tb24 tablet  Generic drug:  mirabegron ER     omeprazole 40 MG capsule  Commonly known as:  PRILOSEC  Take 40 mg by mouth daily.     ONE TOUCH ULTRA TEST test strip  Generic drug:  glucose blood  OVER THE COUNTER MEDICATION  Place 1 drop into both eyes at bedtime. dry eyes med     ramipril 10 MG capsule  Commonly known as:  ALTACE  Take 10 mg by mouth daily.     simvastatin 10 MG tablet  Commonly known as:  ZOCOR  Take 10 mg by mouth Daily.     traMADol 50 MG tablet  Commonly known as:  ULTRAM  Take 50 mg by mouth every 6 (six) hours as needed. For pain        Allergies:  Allergies  Allergen Reactions  . Buprenorphine Hcl Nausea And Vomiting    nausea  . Morphine And Related Nausea And Vomiting    nausea  . Sulfa Antibiotics Nausea And Vomiting    "deathly ill"    Past Medical History, Surgical history, Social history, and Family History were reviewed and updated.  Review of Systems: All other 10 point review of systems is negative.   Physical Exam:  height is 5\' 4"  (1.626 m) and weight is 162 lb (73.483 kg). Her oral temperature is 97.8 F (36.6 C). Her blood pressure is 127/69 and her pulse is 70. Her respiration is 16.   Wt Readings  from Last 3 Encounters:  08/17/15 162 lb (73.483 kg)  05/15/15 167 lb (75.751 kg)  03/12/15 165 lb (74.844 kg)    Ocular: Sclerae unicteric, pupils equal, round and reactive to light Ear-nose-throat: Oropharynx clear, dentition fair Lymphatic: No cervical or supraclavicular adenopathy Lungs no rales or rhonchi, good excursion bilaterally Heart regular rate and rhythm, no murmur appreciated Abd soft, nontender, positive bowel sounds MSK no focal spinal tenderness, no joint edema Neuro: non-focal, well-oriented, appropriate affect Breasts: Deferred  Lab Results  Component Value Date   WBC 6.7 08/17/2015   HGB 13.8 08/17/2015   HCT 39.2 08/17/2015   MCV 88 08/17/2015   PLT 243 08/17/2015   Lab Results  Component Value Date   FERRITIN 23 05/15/2015   IRON 56 05/15/2015   TIBC 339 05/15/2015   UIBC 283 05/15/2015   IRONPCTSAT 17* 05/15/2015   Lab Results  Component Value Date   RETICCTPCT 1.5 01/12/2015   RBC 4.47 08/17/2015   RETICCTABS 63.2 01/12/2015   No results found for: KPAFRELGTCHN, LAMBDASER, KAPLAMBRATIO No results found for: IGGSERUM, IGA, IGMSERUM No results found for: Kathrynn Ducking, MSPIKE, SPEI   Chemistry      Component Value Date/Time   NA 139 05/15/2015 0950   NA 138 03/12/2015 1204   NA 137 01/12/2015 1015   K 3.8 05/15/2015 0950   K 4.0 03/12/2015 1204   K 4.2 01/12/2015 1015   CL 100 03/12/2015 1204   CL 98 01/12/2015 1015   CO2 28 05/15/2015 0950   CO2 25 03/12/2015 1204   CO2 30 01/12/2015 1015   BUN 19.2 05/15/2015 0950   BUN 24 03/12/2015 1204   BUN 23* 01/12/2015 1015   CREATININE 1.2* 05/15/2015 0950   CREATININE 1.19* 03/12/2015 1204   CREATININE 1.1 01/12/2015 1015      Component Value Date/Time   CALCIUM 9.8 05/15/2015 0950   CALCIUM 10.1 03/12/2015 1204   CALCIUM 10.0 01/12/2015 1015   ALKPHOS 39* 05/15/2015 0950   ALKPHOS 36 03/12/2015 1204   ALKPHOS 36 01/12/2015 1015   AST 13  05/15/2015 0950   AST 13 03/12/2015 1204   AST 19 01/12/2015 1015   ALT 12 05/15/2015 0950   ALT 10 03/12/2015 1204   ALT  16 01/12/2015 1015   BILITOT 0.57 05/15/2015 0950   BILITOT 0.4 03/12/2015 1204   BILITOT 0.90 01/12/2015 1015     Impression and Plan: Sara Holt is a very pleasant 81 yo caucasian female with multifactorial anemia (iron deficiency and renal insufficiency). She is still getting over a cough which is slowly improving.  Her CBC looks good today. We will see what her iron studies show and bring her in later this week for an infusion if needed.  Refill for her Tussionex was printed and given to the patient.  We will go ahead and plan to see her back in 3 months for labs and follow-up.  She knows to contact us with any questions or concerns. We can certainly see her sooner if need be.   Eliezer Bottom, NP 2/13/201711:44 AM

## 2015-08-18 ENCOUNTER — Telehealth: Payer: Self-pay | Admitting: *Deleted

## 2015-08-18 ENCOUNTER — Encounter: Payer: Self-pay | Admitting: Family

## 2015-08-18 LAB — RETICULOCYTES: RETICULOCYTE COUNT: 1.9 % (ref 0.6–2.6)

## 2015-08-18 NOTE — Telephone Encounter (Addendum)
Patient aware of results.   ----- Message from Eliezer Bottom, NP sent at 08/17/2015  4:06 PM EST ----- Regarding: iron Looks good. She won't need iron at this time.   Sara Holt  ----- Message -----    From: [facility]    Sent: 08/17/2015   2:13 PM      To: Eliezer Bottom, NP

## 2015-08-25 ENCOUNTER — Ambulatory Visit (HOSPITAL_COMMUNITY)
Admission: RE | Admit: 2015-08-25 | Discharge: 2015-08-25 | Disposition: A | Discharge status: Home / Self Care | Payer: Medicare Other | Source: Ambulatory Visit | Attending: Family | Admitting: Family

## 2015-08-25 ENCOUNTER — Ambulatory Visit (INDEPENDENT_AMBULATORY_CARE_PROVIDER_SITE_OTHER): Payer: Medicare Other | Admitting: Family

## 2015-08-25 ENCOUNTER — Encounter: Payer: Self-pay | Admitting: Family

## 2015-08-25 VITALS — BP 132/72 | HR 68 | Temp 97.4°F | Resp 16 | Ht 64.0 in | Wt 159.0 lb

## 2015-08-25 DIAGNOSIS — I6529 Occlusion and stenosis of unspecified carotid artery: Secondary | ICD-10-CM | POA: Diagnosis not present

## 2015-08-25 DIAGNOSIS — Z48812 Encounter for surgical aftercare following surgery on the circulatory system: Secondary | ICD-10-CM

## 2015-08-25 DIAGNOSIS — Z9889 Other specified postprocedural states: Secondary | ICD-10-CM

## 2015-08-25 DIAGNOSIS — I6523 Occlusion and stenosis of bilateral carotid arteries: Secondary | ICD-10-CM

## 2015-08-25 DIAGNOSIS — E785 Hyperlipidemia, unspecified: Secondary | ICD-10-CM | POA: Diagnosis not present

## 2015-08-25 DIAGNOSIS — I10 Essential (primary) hypertension: Secondary | ICD-10-CM | POA: Diagnosis not present

## 2015-08-25 NOTE — Progress Notes (Signed)
Filed Vitals:   08/25/15 1516 08/25/15 1519 08/25/15 1525  BP: 144/82 134/74 132/72  Pulse: 63 67 68  Temp:  97.4 F (36.3 C)   TempSrc:  Oral   Resp:  16   Height:  5\' 4"  (1.626 m)   Weight:  159 lb (72.122 kg)   SpO2:  98%

## 2015-08-25 NOTE — Patient Instructions (Signed)
Stroke Prevention Some medical conditions and behaviors are associated with an increased chance of having a stroke. You may prevent a stroke by making healthy choices and managing medical conditions. HOW CAN I REDUCE MY RISK OF HAVING A STROKE?   Stay physically active. Get at least 30 minutes of activity on most or all days.  Do not smoke. It may also be helpful to avoid exposure to secondhand smoke.  Limit alcohol use. Moderate alcohol use is considered to be:  No more than 2 drinks per day for men.  No more than 1 drink per day for nonpregnant women.  Eat healthy foods. This involves:  Eating 5 or more servings of fruits and vegetables a day.  Making dietary changes that address high blood pressure (hypertension), high cholesterol, diabetes, or obesity.  Manage your cholesterol levels.  Making food choices that are high in fiber and low in saturated fat, trans fat, and cholesterol may control cholesterol levels.  Take any prescribed medicines to control cholesterol as directed by your health care provider.  Manage your diabetes.  Controlling your carbohydrate and sugar intake is recommended to manage diabetes.  Take any prescribed medicines to control diabetes as directed by your health care provider.  Control your hypertension.  Making food choices that are low in salt (sodium), saturated fat, trans fat, and cholesterol is recommended to manage hypertension.  Ask your health care provider if you need treatment to lower your blood pressure. Take any prescribed medicines to control hypertension as directed by your health care provider.  If you are 18-39 years of age, have your blood pressure checked every 3-5 years. If you are 40 years of age or older, have your blood pressure checked every year.  Maintain a healthy weight.  Reducing calorie intake and making food choices that are low in sodium, saturated fat, trans fat, and cholesterol are recommended to manage  weight.  Stop drug abuse.  Avoid taking birth control pills.  Talk to your health care provider about the risks of taking birth control pills if you are over 35 years old, smoke, get migraines, or have ever had a blood clot.  Get evaluated for sleep disorders (sleep apnea).  Talk to your health care provider about getting a sleep evaluation if you snore a lot or have excessive sleepiness.  Take medicines only as directed by your health care provider.  For some people, aspirin or blood thinners (anticoagulants) are helpful in reducing the risk of forming abnormal blood clots that can lead to stroke. If you have the irregular heart rhythm of atrial fibrillation, you should be on a blood thinner unless there is a good reason you cannot take them.  Understand all your medicine instructions.  Make sure that other conditions (such as anemia or atherosclerosis) are addressed. SEEK IMMEDIATE MEDICAL CARE IF:   You have sudden weakness or numbness of the face, arm, or leg, especially on one side of the body.  Your face or eyelid droops to one side.  You have sudden confusion.  You have trouble speaking (aphasia) or understanding.  You have sudden trouble seeing in one or both eyes.  You have sudden trouble walking.  You have dizziness.  You have a loss of balance or coordination.  You have a sudden, severe headache with no known cause.  You have new chest pain or an irregular heartbeat. Any of these symptoms may represent a serious problem that is an emergency. Do not wait to see if the symptoms will   go away. Get medical help at once. Call your local emergency services (911 in [place] Do not drive yourself to the hospital.   This information is not intended to replace advice given to you by your health care provider. Make sure you discuss any questions you have with your health care provider.   Document Released: 07/28/2004 Document Revised: 07/11/2014 Document Reviewed:  12/21/2012 Elsevier Interactive Patient Education 2016 Else[employer] Inc.  

## 2015-08-25 NOTE — Progress Notes (Signed)
Chief Complaint: Extracranial Carotid Artery Stenosis   History of Present Illness  Sara Holt is a 81 y.o. female patient of Dr. Donnetta Hutching who is status post left CEA in January 2012. She returns today for follow up. She has no history of stroke or TIA.  She has seen a neurologist and a cardiologist, then a hematologist for anemia. She was given iron infusions and feels much improved; has not had any more syncope or pre-syncope since the iron infusions in 2016. Pt states it was found that she has nerve compression in her c-spine which is causing tingling and numbness in right fingers; also has L-spine issues.   Dr. Donnetta Hutching saw pt on 03/11/14 and did not feel that her syncope was related to her carotid artery stenosis at that time. In August 2015 she had a very brief syncopal episode after standing up and walking for a while. In 2012, just before the left CEA, she had transient left monocular blindness, denies unilateral facial drooping, denies hemiparesis, denies aphasia symptoms. Saw her opthalmologist after this.  Pt reports New Medical or Surgical History: none Walks on her treadmill at least 15-20 minutes about 3 days/week. She has arthritis, including her right knee.  She denies any cardiac problems, states her last stress test was a few years ago to evaluate left arm and neck pain, pt states was normal, states c-spine vertabra issue found.  Pt smoker: non-smoker Pt states her DM is in good control, states her last A1C was 6.7.  Pt meds include: Statin : Yes ASA: Yes Other anticoagulants/antiplatelets: no   Past Medical History  Diagnosis Date  . Allergy   . Diabetes mellitus     DIET CONTROLLED, NO MEDS  . GERD (gastroesophageal reflux disease)   . Hyperlipidemia   . Hypertension   . Neuromuscular disorder ([place])     nerve problems with hands  . Arthritis     OA  . Disc     problems in neck and back  . Sleep apnea   . Carotid artery occlusion   . Asthma   .  Osteoporosis   . Anemia     Social History Social History  Substance Use Topics  . Smoking status: Never Smoker   . Smokeless tobacco: Never Used  . Alcohol Use: 4.2 oz/week    7 Glasses of wine per week    Family History Family History  Problem Relation Age of Onset  . Colon cancer Maternal Grandmother   . Cancer Maternal Grandmother     Colon cancer  . Esophageal cancer Neg Hx   . Stomach cancer Neg Hx   . Rectal cancer Neg Hx   . Diabetes Father   . Pneumonia Father   . Peripheral vascular disease Sister     Surgical History Past Surgical History  Procedure Laterality Date  . Colonoscopy    . Polypectomy    . Carotid artery angioplasty    . Carpel tunnel      both hands  . Carotid endarterectomy  Jan. 16,2012    LEFT cea  . Knee arthroscopy Left 2010  . Joint replacement Left 2011    Knee    Allergies  Allergen Reactions  . Buprenorphine Hcl Nausea And Vomiting    nausea  . Morphine And Related Nausea And Vomiting    nausea  . Sulfa Antibiotics Nausea And Vomiting    "deathly ill"    Current Outpatient Prescriptions  Medication Sig Dispense Refill  . alendronate (FOSAMAX)  70 MG tablet Take 70 mg by mouth every 7 (seven) days. Take with a full glass of water on an empty stomach.    Marland Kitchen aspirin 81 MG tablet Take 81 mg by mouth daily.    Marland Kitchen azithromycin (ZITHROMAX Z-PAK) 250 MG tablet Take as directed. 6 each 0  . Calcium Carbonate-Vitamin D (CALCIUM PLUS VITAMIN D PO) Take 500 mg by mouth 3 (three) times daily.    . carvedilol (COREG) 6.25 MG tablet Take 6.25 mg by mouth Twice daily.     . chlorpheniramine-HYDROcodone (TUSSIONEX) 10-8 MG/5ML SUER Take 5 mLs by mouth every 12 (twelve) hours as needed for cough. 140 mL 0  . cloNIDine (CATAPRES) 0.1 MG tablet Take 0.1 mg by mouth as needed (if b/p >150).    . CYMBALTA 60 MG capsule Take 1 tablet by mouth Daily.     Marland Kitchen desoximetasone (TOPICORT) 0.25 % cream Apply topically continuous as needed.    .  hydrochlorothiazide (HYDRODIURIL) 25 MG tablet Take 1 tablet by mouth Daily.     . hydrocortisone (ANUSOL-HC) 25 MG suppository Place 1 suppository (25 mg total) rectally every 12 (twelve) hours. 12 suppository 1  . JANUMET XR 50-1000 MG TB24     . LORazepam (ATIVAN) 1 MG tablet Take one tablet 30 minutes prior to mri.  Take 2nd tablet with you to appt., to take if needed. **MUST HAVE A DRIVER WHILE UNDER THE INFLUENCE OF THIS MEDICATION** 30 tablet 0  . MYRBETRIQ 25 MG TB24 tablet     . omeprazole (PRILOSEC) 40 MG capsule Take 40 mg by mouth daily.    . ONE TOUCH ULTRA TEST test strip   0  . OVER THE COUNTER MEDICATION Place 1 drop into both eyes at bedtime. dry eyes med    . ramipril (ALTACE) 10 MG capsule Take 10 mg by mouth daily.     . simvastatin (ZOCOR) 10 MG tablet Take 10 mg by mouth Daily.     . traMADol (ULTRAM) 50 MG tablet Take 50 mg by mouth every 6 (six) hours as needed. For pain     No current facility-administered medications for this visit.    Review of Systems : See HPI for pertinent positives and negatives.  Physical Examination  Filed Vitals:   08/25/15 1516 08/25/15 1519 08/25/15 1525  BP: 144/82 134/74 132/72  Pulse: 63 67 68  Temp:  97.4 F (36.3 C)   TempSrc:  Oral   Resp:  16   Height:  5\' 4"  (1.626 m)   Weight:  159 lb (72.122 kg)   SpO2:  98%    Body mass index is 27.28 kg/(m^2).  General: WDWN female in NAD GAIT: normal Eyes: PERRLA Pulmonary: Non-labored, CTAB, Negative Rales, Negative rhonchi, & Negative wheezing.  Cardiac: regular rhythm, no detected murmur.  VASCULAR EXAM Carotid Bruits Right Left   Positive Positive    Radial pulses are 2+ palpable and equal. Pedal pulses are palpable.     Gastrointestinal: soft, nontender, BS WNL, no r/g, no masses palpated.  Musculoskeletal: No  muscle atrophy/wasting. M/S 5/5 throughout, Extremities without ischemic changes.  Neurologic: A&O X 3; Appropriate Affect, Speech is normal CN 2-12 intact, Pain and light touch intact in extremities, Motor exam as listed above                      Non-Invasive Vascular Imaging CAROTID DUPLEX 08/25/2015   CEREBROVASCULAR DUPLEX EVALUATION    INDICATION: Carotid artery disease  PREVIOUS INTERVENTION(S): Left carotid endarterectomy 07/19/2010    DUPLEX EXAM:     RIGHT  LEFT  Peak Systolic Velocities (cm/s) End Diastolic Velocities (cm/s) Plaque LOCATION Peak Systolic Velocities (cm/s) End Diastolic Velocities (cm/s) Plaque  63 11  CCA PROXIMAL 91 12 HT  66 11  CCA MID 93 13 HT  67 9  CCA DISTAL 111 11   151 8 HT ECA 268 0 HT  127 16 CP ICA PROXIMAL 38 9   112 17  ICA MID 73 14   69 15  ICA DISTAL 69 15     1.92 ICA / CCA Ratio (PSV) NA  Antegrade  Vertebral Flow Antegrade    Brachial Systolic Pressure (mmHg)   Multiphasic (Subclavian artery) Brachial Artery Waveforms Multiphasic (Subclavian artery)    Plaque Morphology:  HM = Homogeneous, HT = Heterogeneous, CP = Calcific Plaque, SP = Smooth Plaque, IP = Irregular Plaque  ADDITIONAL FINDINGS:     IMPRESSION: . Right internal carotid artery velocities suggest a <40% stenosis; however, velocities may be underestimated due to calcific plaque with acoustic shadowing which makes Doppler interrogation difficult. . Patent left carotid endarterectomy site with no evidence of hyperplasia or restenosis.     Compared to the previous exam:  Right internal carotid artery velocities could not duplicate those shown on the previous exam; however, Doppler gain was grossly overused and most likely overestimated disease.   Right internal carotid artery velocities could not duplicate those shown on the previous exam; however, Doppler gain was grossly overused and most likely overestimated disease   Assessment: Sara Holt  is a 81 y.o. female  who is status post left CEA in January 2012. She has no history of stroke or TIA. It appears that the syncope and presyncope she was having was due to severe iron deficiency anemia. Since this was treated she has felt much improved with no further syncope or presyncope.  Today's carotid duplex suggests  <40% right ICA stenosis; however, velocities may be underestimated due to calcific plaque with acoustic shadowing which makes Doppler interrogation difficult. Patent left carotid endarterectomy site with no evidence of hyperplasia or restenosis. Right internal carotid artery velocities could not duplicate those shown on the previous exam; however, Doppler gain was grossly overused and most likely overestimated disease.  02/19/15 carotid duplex indicated 60-79% right ICA stenosis and widely patent left ICA stenosis.  Will schedule her return surveillance in 6 months; if right ICA stenosis remains less than 60% at that time and she remains asymptomatic, will consider stretching her return to a year.   Plan: Follow-up in 6 months with Carotid Duplex scan.   I discussed in depth with the patient the nature of atherosclerosis, and emphasized the importance of maximal medical management including strict control of blood pressure, blood glucose, and lipid levels, obtaining regular exercise, and continued cessation of smoking.  The patient is aware that without maximal medical management the underlying atherosclerotic disease process will progress, limiting the benefit of any interventions. The patient was given information about stroke prevention and what symptoms should prompt the patient to seek immediate medical care. Thank you for allowing [place] to participate in this patient's care.  Clemon Chambers, RN, MSN, FNP-C [facility] Office: [phone]  Clinic Physician: Early  08/25/2015 3:34 PM

## 2015-11-16 ENCOUNTER — Ambulatory Visit (HOSPITAL_BASED_OUTPATIENT_CLINIC_OR_DEPARTMENT_OTHER): Payer: Medicare Other | Admitting: Hematology & Oncology

## 2015-11-16 ENCOUNTER — Other Ambulatory Visit (HOSPITAL_BASED_OUTPATIENT_CLINIC_OR_DEPARTMENT_OTHER): Payer: Medicare Other

## 2015-11-16 ENCOUNTER — Encounter: Payer: Self-pay | Admitting: Hematology & Oncology

## 2015-11-16 VITALS — BP 143/58 | HR 65 | Temp 97.9°F | Resp 16 | Ht 64.0 in | Wt 162.0 lb

## 2015-11-16 DIAGNOSIS — D509 Iron deficiency anemia, unspecified: Secondary | ICD-10-CM | POA: Diagnosis not present

## 2015-11-16 DIAGNOSIS — D631 Anemia in chronic kidney disease: Secondary | ICD-10-CM

## 2015-11-16 DIAGNOSIS — N189 Chronic kidney disease, unspecified: Principal | ICD-10-CM

## 2015-11-16 DIAGNOSIS — E0800 Diabetes mellitus due to underlying condition with hyperosmolarity without nonketotic hyperglycemic-hyperosmolar coma (NKHHC): Secondary | ICD-10-CM

## 2015-11-16 DIAGNOSIS — E1165 Type 2 diabetes mellitus with hyperglycemia: Secondary | ICD-10-CM

## 2015-11-16 LAB — CBC WITH DIFFERENTIAL (CANCER CENTER ONLY)
BASO#: 0 10*3/uL (ref 0.0–0.2)
BASO%: 0.6 % (ref 0.0–2.0)
EOS%: 3.2 % (ref 0.0–7.0)
Eosinophils Absolute: 0.2 10*3/uL (ref 0.0–0.5)
HEMATOCRIT: 38.4 % (ref 34.8–46.6)
HEMOGLOBIN: 13.5 g/dL (ref 11.6–15.9)
LYMPH#: 2 10*3/uL (ref 0.9–3.3)
LYMPH%: 28.6 % (ref 14.0–48.0)
MCH: 31.5 pg (ref 26.0–34.0)
MCHC: 35.2 g/dL (ref 32.0–36.0)
MCV: 90 fL (ref 81–101)
MONO#: 0.4 10*3/uL (ref 0.1–0.9)
MONO%: 5.4 % (ref 0.0–13.0)
NEUT%: 62.2 % (ref 39.6–80.0)
NEUTROS ABS: 4.3 10*3/uL (ref 1.5–6.5)
Platelets: 223 10*3/uL (ref 145–400)
RBC: 4.28 10*6/uL (ref 3.70–5.32)
RDW: 12.6 % (ref 11.1–15.7)
WBC: 6.9 10*3/uL (ref 3.9–10.0)

## 2015-11-16 LAB — COMPREHENSIVE METABOLIC PANEL
ALBUMIN: 4.1 g/dL (ref 3.5–5.0)
ALK PHOS: 34 U/L — AB (ref 40–150)
ALT: 12 U/L (ref 0–55)
ANION GAP: 8 meq/L (ref 3–11)
AST: 15 U/L (ref 5–34)
BUN: 24.7 mg/dL (ref 7.0–26.0)
CALCIUM: 9.9 mg/dL (ref 8.4–10.4)
CHLORIDE: 104 meq/L (ref 98–109)
CO2: 28 mEq/L (ref 22–29)
CREATININE: 1.1 mg/dL (ref 0.6–1.1)
EGFR: 46 mL/min/{1.73_m2} — ABNORMAL LOW (ref >90)
Glucose: 185 mg/dl — ABNORMAL HIGH (ref 70–140)
POTASSIUM: 4 meq/L (ref 3.5–5.1)
Sodium: 139 mEq/L (ref 136–145)
Total Bilirubin: 0.32 mg/dL (ref 0.20–1.20)
Total Protein: 6.8 g/dL (ref 6.4–8.3)

## 2015-11-16 LAB — FERRITIN: FERRITIN: 89 ng/mL (ref 9–269)

## 2015-11-16 LAB — IRON AND TIBC
%SAT: 23 % (ref 21–57)
IRON: 64 ug/dL (ref 41–142)
TIBC: 278 ug/dL (ref 236–444)
UIBC: 213 ug/dL (ref 120–384)

## 2015-11-16 NOTE — Progress Notes (Signed)
Hematology and Oncology Follow Up Visit  Sara Holt ON:[id number] 26-Nov-1934 81 y.o. 11/16/2015   Principle Diagnosis:  1. Iron deficiency anemia 2. Anemia of renal insufficiency  Current Therapy:   IV iron as indicated     Interim History:  Sara Holt is here today for a follow-up. She is doing okay although there are problems with her blood sugars. She did not insulin dependent diabetic. She does have some neuropathic issues.  These problems that she does not have a endocrinologist right now. She is in between endocrinologist. She like to have her hemoglobin A1c checked. I did this would be reasonable.   Her last iron studies done back in February showed a ferritin of 163 with an iron saturation of 36%. I did this is excellent for her.   She's had no bleeding. Has been no change in bowel or bladder habits. She's had no rashes. She's had no fever. She's had no cough.   I'm not sure what her last mammogram was.   She has a performance status of ECOG 2..  Medications:    Medication List       This list is accurate as of: 11/16/15 11:45 AM.  Always use your most recent med list.               alendronate 70 MG tablet  Commonly known as:  FOSAMAX  Take 70 mg by mouth every 7 (seven) days. Take with a full glass of water on an empty stomach.     aspirin 81 MG tablet  Take 81 mg by mouth daily.     carvedilol 6.25 MG tablet  Commonly known as:  COREG  Take 6.25 mg by mouth Twice daily.     cloNIDine 0.1 MG tablet  Commonly known as:  CATAPRES  Take 0.1 mg by mouth as needed (if b/p >150).     CYMBALTA 60 MG capsule  Generic drug:  DULoxetine  Take 1 tablet by mouth Daily.     desoximetasone 0.25 % cream  Commonly known as:  TOPICORT  Apply topically continuous as needed.     hydrochlorothiazide 25 MG tablet  Commonly known as:  HYDRODIURIL  Take 1 tablet by mouth Daily.     JANUMET XR 50-1000 MG Tb24  Generic drug:  SitaGLIPtin-MetFORMIN HCl     omeprazole 40 MG capsule  Commonly known as:  PRILOSEC  Take 40 mg by mouth daily.     ONE TOUCH ULTRA TEST test strip  Generic drug:  glucose blood     OVER THE COUNTER MEDICATION  Place 1 drop into both eyes at bedtime. dry eyes med     oxybutynin 5 MG tablet  Commonly known as:  DITROPAN     ramipril 10 MG capsule  Commonly known as:  ALTACE  Take 10 mg by mouth daily.     simvastatin 10 MG tablet  Commonly known as:  ZOCOR  Take 10 mg by mouth Daily.     traMADol 50 MG tablet  Commonly known as:  ULTRAM  Take 50 mg by mouth every 6 (six) hours as needed. For pain     XIIDRA 5 % Soln  Generic drug:  Lifitegrast        Allergies:  Allergies  Allergen Reactions  . Buprenorphine Hcl Nausea And Vomiting    nausea  . Morphine And Related Nausea And Vomiting    nausea  . Sulfa Antibiotics Nausea And Vomiting    "deathly ill"  Past Medical History, Surgical history, Social history, and Family History were reviewed and updated.  Review of Systems: All other 10 point review of systems is negative.   Physical Exam:  height is 5\' 4"  (1.626 m) and weight is 162 lb (73.483 kg). Her oral temperature is 97.9 F (36.6 C). Her blood pressure is 143/58 and her pulse is 65. Her respiration is 16.   Wt Readings from Last 3 Encounters:  11/16/15 162 lb (73.483 kg)  08/25/15 159 lb (72.122 kg)  08/17/15 162 lb (73.483 kg)    Well-developed well-nourished white female in no obvious distress. Head and neck exam shows no ocular or oral lesions. There are no palpable cervical or supraclavicular lymph nodes. Lungs are clear. Cardiac exam regular rate and rhythm with no murmurs, rubs or bruits. Abdomen is soft. She has good bowel sounds. There is no fluid wave. There is no palpable liver or spleen tip. Back exam shows no tenderness over the spine, ribs or hips. Extremities shows no clubbing, cyanosis or edema. Skin exam shows no rashes, ecchymoses or petechia.  Lab Results    Component Value Date   WBC 6.9 11/16/2015   HGB 13.5 11/16/2015   HCT 38.4 11/16/2015   MCV 90 11/16/2015   PLT 223 11/16/2015   Lab Results  Component Value Date   FERRITIN 163 08/17/2015   IRON 97 08/17/2015   TIBC 269 08/17/2015   UIBC 171 08/17/2015   IRONPCTSAT 36 08/17/2015   Lab Results  Component Value Date   RETICCTPCT 1.5 01/12/2015   RBC 4.28 11/16/2015   RETICCTABS 63.2 01/12/2015   No results found for: KPAFRELGTCHN, LAMBDASER, KAPLAMBRATIO No results found for: IGGSERUM, IGA, IGMSERUM No results found for: Kathrynn Ducking, MSPIKE, SPEI   Chemistry      Component Value Date/Time   NA 139 05/15/2015 0950   NA 138 03/12/2015 1204   NA 137 01/12/2015 1015   K 3.8 05/15/2015 0950   K 4.0 03/12/2015 1204   K 4.2 01/12/2015 1015   CL 100 03/12/2015 1204   CL 98 01/12/2015 1015   CO2 28 05/15/2015 0950   CO2 25 03/12/2015 1204   CO2 30 01/12/2015 1015   BUN 19.2 05/15/2015 0950   BUN 24 03/12/2015 1204   BUN 23* 01/12/2015 1015   CREATININE 1.2* 05/15/2015 0950   CREATININE 1.19* 03/12/2015 1204   CREATININE 1.1 01/12/2015 1015      Component Value Date/Time   CALCIUM 9.8 05/15/2015 0950   CALCIUM 10.1 03/12/2015 1204   CALCIUM 10.0 01/12/2015 1015   ALKPHOS 39* 05/15/2015 0950   ALKPHOS 36 03/12/2015 1204   ALKPHOS 36 01/12/2015 1015   AST 13 05/15/2015 0950   AST 13 03/12/2015 1204   AST 19 01/12/2015 1015   ALT 12 05/15/2015 0950   ALT 10 03/12/2015 1204   ALT 16 01/12/2015 1015   BILITOT 0.57 05/15/2015 0950   BILITOT 0.4 03/12/2015 1204   BILITOT 0.90 01/12/2015 1015     Impression and Plan: Sara Holt is a very pleasant 81 yo white female. She has some iron deficiency anemia.   Her hemoglobin is doing quite well. Is stable from my point of view. I would be surprised her iron level is low.  She apparently is not seeing her diabetes doctor. She says her blood sugars are quite high. I will go  ahead and get hemoglobin A1c on her.  I would like to see her back in about  3 months. I think that this would be reasonable given her current medical problems.   [name], MD 5/15/201711:45 AM

## 2015-11-17 ENCOUNTER — Telehealth: Payer: Self-pay | Admitting: *Deleted

## 2015-11-17 ENCOUNTER — Encounter: Payer: Self-pay | Admitting: *Deleted

## 2015-11-17 LAB — HEMOGLOBIN A1C
Est. average glucose Bld gHb Est-mCnc: 160 mg/dL
Hemoglobin A1c: 7.2 % — ABNORMAL HIGH (ref 4.8–5.6)

## 2015-11-17 LAB — RETICULOCYTES: RETICULOCYTE COUNT: 1.6 % (ref 0.6–2.6)

## 2015-11-17 NOTE — Telephone Encounter (Addendum)
Patient aware of results. Will send to her PCP's office.   ----- Message from [name], MD sent at 11/17/2015 10:19 AM EDT ----- Call - hemoglobin A1C is on the high side - 7.2.  Your diabetic MD can address this!!  Please send a copy of this to her diabtetic MD.  Laurey Arrow

## 2015-12-24 DIAGNOSIS — E119 Type 2 diabetes mellitus without complications: Secondary | ICD-10-CM | POA: Insufficient documentation

## 2015-12-25 DIAGNOSIS — N183 Chronic kidney disease, stage 3 unspecified: Secondary | ICD-10-CM | POA: Insufficient documentation

## 2016-02-15 ENCOUNTER — Ambulatory Visit (HOSPITAL_BASED_OUTPATIENT_CLINIC_OR_DEPARTMENT_OTHER): Payer: Medicare Other | Admitting: Family

## 2016-02-15 ENCOUNTER — Other Ambulatory Visit (HOSPITAL_BASED_OUTPATIENT_CLINIC_OR_DEPARTMENT_OTHER): Payer: Medicare Other

## 2016-02-15 ENCOUNTER — Encounter: Payer: Self-pay | Admitting: Family

## 2016-02-15 VITALS — BP 176/73 | HR 70 | Temp 98.2°F | Resp 18 | Ht 64.0 in | Wt 161.0 lb

## 2016-02-15 DIAGNOSIS — D509 Iron deficiency anemia, unspecified: Secondary | ICD-10-CM | POA: Diagnosis not present

## 2016-02-15 DIAGNOSIS — N189 Chronic kidney disease, unspecified: Secondary | ICD-10-CM

## 2016-02-15 DIAGNOSIS — D649 Anemia, unspecified: Secondary | ICD-10-CM

## 2016-02-15 DIAGNOSIS — N289 Disorder of kidney and ureter, unspecified: Secondary | ICD-10-CM

## 2016-02-15 DIAGNOSIS — D631 Anemia in chronic kidney disease: Secondary | ICD-10-CM

## 2016-02-15 DIAGNOSIS — E0800 Diabetes mellitus due to underlying condition with hyperosmolarity without nonketotic hyperglycemic-hyperosmolar coma (NKHHC): Secondary | ICD-10-CM

## 2016-02-15 LAB — CBC WITH DIFFERENTIAL (CANCER CENTER ONLY)
BASO#: 0 10*3/uL (ref 0.0–0.2)
BASO%: 0.3 % (ref 0.0–2.0)
EOS ABS: 0.3 10*3/uL (ref 0.0–0.5)
EOS%: 2.9 % (ref 0.0–7.0)
HCT: 42.4 % (ref 34.8–46.6)
HGB: 14.7 g/dL (ref 11.6–15.9)
LYMPH#: 2.1 10*3/uL (ref 0.9–3.3)
LYMPH%: 20.4 % (ref 14.0–48.0)
MCH: 30.9 pg (ref 26.0–34.0)
MCHC: 34.7 g/dL (ref 32.0–36.0)
MCV: 89 fL (ref 81–101)
MONO#: 0.6 10*3/uL (ref 0.1–0.9)
MONO%: 6.2 % (ref 0.0–13.0)
NEUT#: 7.3 10*3/uL — ABNORMAL HIGH (ref 1.5–6.5)
NEUT%: 70.2 % (ref 39.6–80.0)
PLATELETS: 224 10*3/uL (ref 145–400)
RBC: 4.76 10*6/uL (ref 3.70–5.32)
RDW: 12.8 % (ref 11.1–15.7)
WBC: 10.4 10*3/uL — ABNORMAL HIGH (ref 3.9–10.0)

## 2016-02-15 LAB — COMPREHENSIVE METABOLIC PANEL
ALBUMIN: 4.1 g/dL (ref 3.5–5.0)
ALK PHOS: 33 U/L — AB (ref 40–150)
ALT: 13 U/L (ref 0–55)
AST: 18 U/L (ref 5–34)
Anion Gap: 9 mEq/L (ref 3–11)
BILIRUBIN TOTAL: 0.6 mg/dL (ref 0.20–1.20)
BUN: 25.7 mg/dL (ref 7.0–26.0)
CO2: 26 meq/L (ref 22–29)
Calcium: 10.4 mg/dL (ref 8.4–10.4)
Chloride: 106 mEq/L (ref 98–109)
Creatinine: 1.1 mg/dL (ref 0.6–1.1)
EGFR: 46 mL/min/{1.73_m2} — ABNORMAL LOW (ref >90)
GLUCOSE: 143 mg/dL — AB (ref 70–140)
POTASSIUM: 4.4 meq/L (ref 3.5–5.1)
Sodium: 141 mEq/L (ref 136–145)
TOTAL PROTEIN: 7.2 g/dL (ref 6.4–8.3)

## 2016-02-15 LAB — CHCC SATELLITE - SMEAR

## 2016-02-15 LAB — IRON AND TIBC
%SAT: 27 % (ref 21–57)
Iron: 90 ug/dL (ref 41–142)
TIBC: 333 ug/dL (ref 236–444)
UIBC: 243 ug/dL (ref 120–384)

## 2016-02-15 LAB — LACTATE DEHYDROGENASE: LDH: 196 U/L (ref 125–245)

## 2016-02-15 LAB — FERRITIN: Ferritin: 38 ng/ml (ref 9–269)

## 2016-02-15 NOTE — Progress Notes (Signed)
Hematology and Oncology Follow Up Visit  Sara Holt ON:[id number] 05-Dec-1934 81 y.o. 02/15/2016   Principle Diagnosis:  1. Iron deficiency anemia 2. Anemia of renal insufficiency  Current Therapy:   IV iron as indicated     Interim History:  Sara Holt is here today for a follow-up. She is doing well and has no complaints at this time.  She received 2 doses of Feraheme in November of last year and responded nicely. Her iron saturation in May was 23% with a ferritin of 89. She has no c.o fatigue.  No fever, chills, n/v, cough, rash, dizziness, SOB, chest pain, palpitations, abdominal pain or changes in bowel or bladder habits. She is now on [place] and Janumet daily. She states that her Hgb A1c is now down to 7.2. Her blood sugars have been much more controlled.  She has maintained a good appetite and is staying well hydrated. Her weight is stable.  No swelling or tenderness in her extremities at this time. She still has numbness and tingling in the right hand due to a pinched nerve.  She is walking on a treadmill daily for exercise. No falls or syncopal episodes.   Medications:    Medication List       Accurate as of 02/15/16 10:04 AM. Always use your most recent med list.          alendronate 70 MG tablet Commonly known as:  FOSAMAX Take 70 mg by mouth every 7 (seven) days. Take with a full glass of water on an empty stomach.   aspirin 81 MG tablet Take 81 mg by mouth daily.   carvedilol 6.25 MG tablet Commonly known as:  COREG Take 6.25 mg by mouth Twice daily.   cloNIDine 0.1 MG tablet Commonly known as:  CATAPRES Take 0.1 mg by mouth as needed (if b/p >150).   CYMBALTA 60 MG capsule Generic drug:  DULoxetine Take 1 tablet by mouth Daily.   desoximetasone 0.25 % cream Commonly known as:  TOPICORT Apply topically continuous as needed.   hydrochlorothiazide 25 MG tablet Commonly known as:  HYDRODIURIL Take 1 tablet by mouth Daily.   JANUMET XR  50-1000 MG Tb24 Generic drug:  SitaGLIPtin-MetFORMIN HCl   omeprazole 40 MG capsule Commonly known as:  PRILOSEC Take 40 mg by mouth daily.   ONE TOUCH ULTRA TEST test strip Generic drug:  glucose blood   OVER THE COUNTER MEDICATION Place 1 drop into both eyes at bedtime. dry eyes med   ramipril 10 MG capsule Commonly known as:  ALTACE Take 10 mg by mouth daily.   simvastatin 10 MG tablet Commonly known as:  ZOCOR Take 10 mg by mouth Daily.   traMADol 50 MG tablet Commonly known as:  ULTRAM       Allergies:  Allergies  Allergen Reactions  . Buprenorphine Hcl Nausea And Vomiting    nausea  . Morphine And Related Nausea And Vomiting    nausea  . Sulfa Antibiotics Nausea And Vomiting    "deathly ill"    Past Medical History, Surgical history, Social history, and Family History were reviewed and updated.  Review of Systems: All other 10 point review of systems is negative.   Physical Exam:  height is 5\' 4"  (1.626 m) and weight is 161 lb (73 kg). Her oral temperature is 98.2 F (36.8 C). Her blood pressure is 176/73 (abnormal) and her pulse is 70. Her respiration is 18.   Wt Readings from Last 3 Encounters:  02/15/16 161  lb (73 kg)  11/16/15 162 lb (73.5 kg)  08/25/15 159 lb (72.1 kg)    Ocular: Sclerae unicteric, pupils equal, round and reactive to light Ear-nose-throat: Oropharynx clear, dentition fair Lymphatic: No cervical supraclavicular or axillary adenopathy Lungs no rales or rhonchi, good excursion bilaterally Heart regular rate and rhythm, no murmur appreciated Abd soft, nontender, positive bowel sounds, no liver or spleen tip palpated on exam MSK no focal spinal tenderness, no joint edema Neuro: non-focal, well-oriented, appropriate affect Breasts: Deferred  Lab Results  Component Value Date   WBC 10.4 (H) 02/15/2016   HGB 14.7 02/15/2016   HCT 42.4 02/15/2016   MCV 89 02/15/2016   PLT 224 02/15/2016   Lab Results  Component Value Date    FERRITIN 89 11/16/2015   IRON 64 11/16/2015   TIBC 278 11/16/2015   UIBC 213 11/16/2015   IRONPCTSAT 23 11/16/2015   Lab Results  Component Value Date   RETICCTPCT 1.5 01/12/2015   RBC 4.76 02/15/2016   RETICCTABS 63.2 01/12/2015   No results found for: KPAFRELGTCHN, LAMBDASER, KAPLAMBRATIO No results found for: IGGSERUM, IGA, IGMSERUM No results found for: Ronnald Ramp, A1GS, A2GS, Tillman Sers, SPEI   Chemistry      Component Value Date/Time   NA 139 11/16/2015 1041   K 4.0 11/16/2015 1041   CL 100 03/12/2015 1204   CL 98 01/12/2015 1015   CO2 28 11/16/2015 1041   BUN 24.7 11/16/2015 1041   CREATININE 1.1 11/16/2015 1041      Component Value Date/Time   CALCIUM 9.9 11/16/2015 1041   ALKPHOS 34 (L) 11/16/2015 1041   AST 15 11/16/2015 1041   ALT 12 11/16/2015 1041   BILITOT 0.32 11/16/2015 1041     Impression and Plan: Sara Holt is a very pleasant 81 yo caucasian female with multifactorial anemia (iron deficiency and renal insufficiency). She is doing well and has not required an iron infusion in over 8 months. She is asymptomatic at this time and has no complaints.  We will see what her iron studies show and bring her in later this week for an infusion if needed.  CBC today looks good. Hgb is stable at 14.7 with an MCV of 89.  We will go ahead and plan to see her back in 3 months for labs and follow-up.  She knows to contact us with any questions or concerns. We can certainly see her sooner if need be.   Eliezer Bottom, NP 8/14/201710:04 AM

## 2016-02-16 LAB — RETICULOCYTES: Reticulocyte Count: 1.6 % (ref 0.6–2.6)

## 2016-02-26 ENCOUNTER — Encounter: Payer: Self-pay | Admitting: Family

## 2016-03-02 ENCOUNTER — Ambulatory Visit (HOSPITAL_COMMUNITY)
Admission: RE | Admit: 2016-03-02 | Discharge: 2016-03-02 | Disposition: A | Discharge status: Home / Self Care | Payer: Medicare Other | Source: Ambulatory Visit | Attending: Family | Admitting: Family

## 2016-03-02 ENCOUNTER — Encounter: Payer: Self-pay | Admitting: Family

## 2016-03-02 ENCOUNTER — Ambulatory Visit (INDEPENDENT_AMBULATORY_CARE_PROVIDER_SITE_OTHER): Payer: Medicare Other | Admitting: Family

## 2016-03-02 VITALS — BP 141/78 | HR 55 | Temp 98.0°F | Resp 16 | Ht 64.0 in | Wt 160.0 lb

## 2016-03-02 DIAGNOSIS — Z48812 Encounter for surgical aftercare following surgery on the circulatory system: Secondary | ICD-10-CM

## 2016-03-02 DIAGNOSIS — I6523 Occlusion and stenosis of bilateral carotid arteries: Secondary | ICD-10-CM | POA: Insufficient documentation

## 2016-03-02 DIAGNOSIS — Z9889 Other specified postprocedural states: Secondary | ICD-10-CM | POA: Insufficient documentation

## 2016-03-02 NOTE — Patient Instructions (Signed)
Stroke Prevention Some medical conditions and behaviors are associated with an increased chance of having a stroke. You may prevent a stroke by making healthy choices and managing medical conditions. HOW CAN I REDUCE MY RISK OF HAVING A STROKE?   Stay physically active. Get at least 30 minutes of activity on most or all days.  Do not smoke. It may also be helpful to avoid exposure to secondhand smoke.  Limit alcohol use. Moderate alcohol use is considered to be:  No more than 2 drinks per day for men.  No more than 1 drink per day for nonpregnant women.  Eat healthy foods. This involves:  Eating 5 or more servings of fruits and vegetables a day.  Making dietary changes that address high blood pressure (hypertension), high cholesterol, diabetes, or obesity.  Manage your cholesterol levels.  Making food choices that are high in fiber and low in saturated fat, trans fat, and cholesterol may control cholesterol levels.  Take any prescribed medicines to control cholesterol as directed by your health care provider.  Manage your diabetes.  Controlling your carbohydrate and sugar intake is recommended to manage diabetes.  Take any prescribed medicines to control diabetes as directed by your health care provider.  Control your hypertension.  Making food choices that are low in salt (sodium), saturated fat, trans fat, and cholesterol is recommended to manage hypertension.  Ask your health care provider if you need treatment to lower your blood pressure. Take any prescribed medicines to control hypertension as directed by your health care provider.  If you are 18-39 years of age, have your blood pressure checked every 3-5 years. If you are 40 years of age or older, have your blood pressure checked every year.  Maintain a healthy weight.  Reducing calorie intake and making food choices that are low in sodium, saturated fat, trans fat, and cholesterol are recommended to manage  weight.  Stop drug abuse.  Avoid taking birth control pills.  Talk to your health care provider about the risks of taking birth control pills if you are over 35 years old, smoke, get migraines, or have ever had a blood clot.  Get evaluated for sleep disorders (sleep apnea).  Talk to your health care provider about getting a sleep evaluation if you snore a lot or have excessive sleepiness.  Take medicines only as directed by your health care provider.  For some people, aspirin or blood thinners (anticoagulants) are helpful in reducing the risk of forming abnormal blood clots that can lead to stroke. If you have the irregular heart rhythm of atrial fibrillation, you should be on a blood thinner unless there is a good reason you cannot take them.  Understand all your medicine instructions.  Make sure that other conditions (such as anemia or atherosclerosis) are addressed. SEEK IMMEDIATE MEDICAL CARE IF:   You have sudden weakness or numbness of the face, arm, or leg, especially on one side of the body.  Your face or eyelid droops to one side.  You have sudden confusion.  You have trouble speaking (aphasia) or understanding.  You have sudden trouble seeing in one or both eyes.  You have sudden trouble walking.  You have dizziness.  You have a loss of balance or coordination.  You have a sudden, severe headache with no known cause.  You have new chest pain or an irregular heartbeat. Any of these symptoms may represent a serious problem that is an emergency. Do not wait to see if the symptoms will   go away. Get medical help at once. Call your local emergency services (911 in [place] Do not drive yourself to the hospital.   This information is not intended to replace advice given to you by your health care provider. Make sure you discuss any questions you have with your health care provider.   Document Released: 07/28/2004 Document Revised: 07/11/2014 Document Reviewed:  12/21/2012 Elsevier Interactive Patient Education 2016 Else[employer] Inc.  

## 2016-03-02 NOTE — Progress Notes (Signed)
Chief Complaint: Follow up Extracranial Carotid Artery Stenosis   History of Present Illness  Sara Holt is a 81 y.o. female  patient of Dr. Donnetta Hutching who is status post left CEA in January 2012. She returns today for follow up. She has no history of stroke or TIA.  She has seen a neurologist and a cardiologist, then a hematologist for anemia. She was given iron infusions and feels much improved; has not had any more syncope or pre-syncope since the iron infusions in 2016. Pt states it was found that she has nerve compression in her c-spine which is causing tingling and numbness in right fingers; also has L-spine issues.   Dr. Donnetta Hutching saw pt on 03/11/14 and did not feel that her syncope was related to her carotid artery stenosis at that time. In August 2015 she had a very brief syncopal episode after standing up and walking for a while. In 2012, just before the left CEA, she had transient left monocular blindness, denies unilateral facial drooping, denies hemiparesis, denies aphasia symptoms. Saw her opthalmologist after this.  She reports "bone on bone" of her right knee, states the left knee has been replaced.  Walks on her treadmill at least 15-20 minutes about 3 days/week. She has arthritis, including her right knee.  She denies any cardiac problems, states her last stress test was a few years ago to evaluate left arm and neck pain, pt states was normal, states c-spine vertabra issue found.  Pt smoker: non-smoker DM: A1C in May 2017 was 7.2 (review of records)  Pt meds include: Statin : Yes ASA: Yes Other anticoagulants/antiplatelets: no     Past Medical History:  Diagnosis Date  . Allergy   . Anemia   . Arthritis    OA  . Asthma   . Carotid artery occlusion   . Diabetes mellitus    DIET CONTROLLED, NO MEDS  . Disc    problems in neck and back  . GERD (gastroesophageal reflux disease)   . Hyperlipidemia   . Hypertension   . Neuromuscular disorder ([place])    nerve problems with hands  . Osteoporosis   . Sleep apnea     Social History Social History  Substance Use Topics  . Smoking status: Never Smoker  . Smokeless tobacco: Never Used  . Alcohol use 4.2 oz/week    7 Glasses of wine per week    Family History Family History  Problem Relation Age of Onset  . Diabetes Father   . Pneumonia Father   . Peripheral vascular disease Sister   . Colon cancer Maternal Grandmother   . Cancer Maternal Grandmother     Colon cancer  . Esophageal cancer Neg Hx   . Stomach cancer Neg Hx   . Rectal cancer Neg Hx     Surgical History Past Surgical History:  Procedure Laterality Date  . CAROTID ARTERY ANGIOPLASTY    . CAROTID ENDARTERECTOMY  Jan. 16,2012   LEFT cea  . carpel tunnel     both hands  . COLONOSCOPY    . JOINT REPLACEMENT Left 2011   Knee  . KNEE ARTHROSCOPY Left 2010  . POLYPECTOMY      Allergies  Allergen Reactions  . Buprenorphine Hcl Nausea And Vomiting    nausea  . Morphine And Related Nausea And Vomiting    nausea  . Sulfa Antibiotics Nausea And Vomiting    "deathly ill"    Current Outpatient Prescriptions  Medication Sig Dispense Refill  . alendronate (  FOSAMAX) 70 MG tablet Take 70 mg by mouth every 7 (seven) days. Take with a full glass of water on an empty stomach.    Marland Kitchen aspirin 81 MG tablet Take 81 mg by mouth daily.    . canagliflozin (INVOKANA) 100 MG TABS tablet Take 100 mg by mouth daily before breakfast.    . carvedilol (COREG) 6.25 MG tablet Take 6.25 mg by mouth Twice daily.     . cloNIDine (CATAPRES) 0.1 MG tablet Take 0.1 mg by mouth as needed (if b/p >150).    . CYMBALTA 60 MG capsule Take 1 tablet by mouth Daily.     Marland Kitchen desoximetasone (TOPICORT) 0.25 % cream Apply topically continuous as needed.    . hydrochlorothiazide (HYDRODIURIL) 25 MG tablet Take 1 tablet by mouth Daily.     Marland Kitchen JANUMET XR 50-1000 MG TB24 100-1,000 mg.     . omeprazole (PRILOSEC) 40 MG capsule Take 40 mg by mouth daily.    .  ONE TOUCH ULTRA TEST test strip   0  . OVER THE COUNTER MEDICATION Place 1 drop into both eyes at bedtime. dry eyes med    . ramipril (ALTACE) 10 MG capsule Take 10 mg by mouth daily.     . simvastatin (ZOCOR) 10 MG tablet Take 10 mg by mouth Daily.     . traMADol (ULTRAM) 50 MG tablet      No current facility-administered medications for this visit.     Review of Systems : See HPI for pertinent positives and negatives.  Physical Examination  Vitals:   03/02/16 0951 03/02/16 0953  BP: (!) 146/73 (!) 141/78  Pulse: (!) 55   Resp: 16   Temp: 98 F (36.7 C)   TempSrc: Oral   SpO2: 97%   Weight: 160 lb (72.6 kg)   Height: 5\' 4"  (1.626 m)    Body mass index is 27.46 kg/m.  General: WDWN female in NAD GAIT: normal Eyes: PERRLA Pulmonary: Respirations are non-labored, CTAB, good air movement  Cardiac: regular rhythm, controlled rate, + murmur.  VASCULAR EXAM Carotid Bruits Right Left   Positive Positive    Radial pulses are 2+ palpable and equal. Pedal pulses are palpable.     Gastrointestinal: soft, nontender, BS WNL, no r/g, no masses palpated.  Musculoskeletal: No muscle atrophy/wasting. M/S 5/5 throughout, Extremities without ischemic changes.  Neurologic: A&O X 3; Appropriate Affect, Speech is normal CN 2-12 intact, Pain and light touch intact in extremities, Motor exam as listed above    Assessment: Sara Holt is a 81 y.o. female  who is status post left CEA in January 2012. She has no history of stroke or TIA. It appears that the syncope and presyncope she was having was due to severe iron deficiency anemia. Since this was treated she has felt much improved with no further syncope or presyncope.  DATA Today's carotid duplex suggests  <40% right ICA stenosis; however, velocities may be  underestimated due to calcific plaque with acoustic shadowing which makes Doppler interrogation difficult. Patent left carotid endarterectomy site with no evidence of hyperplasia or restenosis. Right internal carotid artery velocities could not duplicate those shown on the previous exam; however, Doppler gain was grossly overused and most likely overestimated disease.  No significant change compared to exam of 08/25/15.  02/19/15 carotid duplex indicated 60-79% right ICA stenosis and widely patent left ICA stenosis.    Plan: Follow-up in 1 year with Carotid Duplex scan.   I discussed in depth with the  patient the nature of atherosclerosis, and emphasized the importance of maximal medical management including strict control of blood pressure, blood glucose, and lipid levels, obtaining regular exercise, and continued cessation of smoking.  The patient is aware that without maximal medical management the underlying atherosclerotic disease process will progress, limiting the benefit of any interventions. The patient was given information about stroke prevention and what symptoms should prompt the patient to seek immediate medical care. Thank you for allowing [place] to participate in this patient's care.  Clemon Chambers, RN, MSN, FNP-C [facility] Office: [phone]  Clinic Physician: Scot Dock  03/02/16 10:05 AM

## 2016-03-03 ENCOUNTER — Other Ambulatory Visit: Payer: Self-pay | Admitting: Family

## 2016-03-03 DIAGNOSIS — I63233 Cerebral infarction due to unspecified occlusion or stenosis of bilateral carotid arteries: Secondary | ICD-10-CM

## 2016-05-16 ENCOUNTER — Other Ambulatory Visit: Payer: Medicare Other

## 2016-05-16 ENCOUNTER — Ambulatory Visit: Payer: Medicare Other | Admitting: Hematology & Oncology

## 2016-05-20 ENCOUNTER — Ambulatory Visit (HOSPITAL_BASED_OUTPATIENT_CLINIC_OR_DEPARTMENT_OTHER): Payer: Medicare Other | Admitting: Hematology & Oncology

## 2016-05-20 ENCOUNTER — Other Ambulatory Visit (HOSPITAL_BASED_OUTPATIENT_CLINIC_OR_DEPARTMENT_OTHER): Payer: Medicare Other

## 2016-05-20 VITALS — BP 136/79 | HR 73 | Temp 99.0°F | Resp 20 | Wt 160.8 lb

## 2016-05-20 DIAGNOSIS — D509 Iron deficiency anemia, unspecified: Secondary | ICD-10-CM | POA: Diagnosis not present

## 2016-05-20 DIAGNOSIS — N189 Chronic kidney disease, unspecified: Secondary | ICD-10-CM

## 2016-05-20 DIAGNOSIS — D631 Anemia in chronic kidney disease: Secondary | ICD-10-CM

## 2016-05-20 LAB — CBC WITH DIFFERENTIAL (CANCER CENTER ONLY)
BASO#: 0 10*3/uL (ref 0.0–0.2)
BASO%: 0.2 % (ref 0.0–2.0)
EOS%: 2 % (ref 0.0–7.0)
Eosinophils Absolute: 0.2 10*3/uL (ref 0.0–0.5)
HCT: 44.7 % (ref 34.8–46.6)
HEMOGLOBIN: 15.2 g/dL (ref 11.6–15.9)
LYMPH#: 2.7 10*3/uL (ref 0.9–3.3)
LYMPH%: 23.8 % (ref 14.0–48.0)
MCH: 29.9 pg (ref 26.0–34.0)
MCHC: 34 g/dL (ref 32.0–36.0)
MCV: 88 fL (ref 81–101)
MONO#: 0.6 10*3/uL (ref 0.1–0.9)
MONO%: 5.1 % (ref 0.0–13.0)
NEUT%: 68.9 % (ref 39.6–80.0)
NEUTROS ABS: 7.8 10*3/uL — AB (ref 1.5–6.5)
Platelets: 240 10*3/uL (ref 145–400)
RBC: 5.08 10*6/uL (ref 3.70–5.32)
RDW: 13.4 % (ref 11.1–15.7)
WBC: 11.3 10*3/uL — AB (ref 3.9–10.0)

## 2016-05-20 LAB — IRON AND TIBC
%SAT: 23 % (ref 21–57)
IRON: 76 ug/dL (ref 41–142)
TIBC: 339 ug/dL (ref 236–444)
UIBC: 262 ug/dL (ref 120–384)

## 2016-05-20 LAB — RETICULOCYTES: Reticulocyte Count: 1.7 % (ref 0.6–2.6)

## 2016-05-20 LAB — FERRITIN: FERRITIN: 42 ng/mL (ref 9–269)

## 2016-05-20 NOTE — Progress Notes (Signed)
Hematology and Oncology Follow Up Visit  Sara Holt ON:[id number] 1935/04/07 81 y.o. 05/20/2016   Principle Diagnosis:  1. Iron deficiency anemia 2. Anemia of renal insufficiency  Current Therapy:   IV iron as indicated     Interim History:  Sara Holt is here today for a follow-up. She just got back from [place]. She and her husband were over there for about 10 days. She got back a few days ago. There is still some jet lag. She had a great time. She really had no issues while she was over there. She watched what she ate.  She says that her last hemoglobin A1c was 6.2.  He probably has been a year since she had iron. Echo was saw her in August, her ferritin was 38 with iron saturation of 27%..   She has had no bleeding. There's been no change in bowel or bladder habits. She's had no nausea or vomiting. She's had no rashes. She's had no leg swelling.   She has a performance status of ECOG 1..  Medications:    Medication List       Accurate as of 05/20/16 11:28 AM. Always use your most recent med list.          alendronate 70 MG tablet Commonly known as:  FOSAMAX Take 70 mg by mouth every 7 (seven) days. Take with a full glass of water on an empty stomach.   aspirin 81 MG tablet Take 81 mg by mouth daily.   carvedilol 6.25 MG tablet Commonly known as:  COREG Take 6.25 mg by mouth Twice daily.   cloNIDine 0.1 MG tablet Commonly known as:  CATAPRES Take 0.1 mg by mouth as needed (if b/p >150).   CYMBALTA 60 MG capsule Generic drug:  DULoxetine Take 1 tablet by mouth Daily.   desoximetasone 0.25 % cream Commonly known as:  TOPICORT Apply topically continuous as needed.   hydrochlorothiazide 25 MG tablet Commonly known as:  HYDRODIURIL Take 1 tablet by mouth Daily.   INVOKANA 100 MG Tabs tablet Generic drug:  canagliflozin Take 100 mg by mouth daily before breakfast.   JANUMET XR 50-1000 MG Tb24 Generic drug:  SitaGLIPtin-MetFORMIN HCl 100-1,000  mg.   omeprazole 40 MG capsule Commonly known as:  PRILOSEC Take 40 mg by mouth daily.   ONE TOUCH ULTRA TEST test strip Generic drug:  glucose blood   OVER THE COUNTER MEDICATION Place 1 drop into both eyes at bedtime. dry eyes med   ramipril 10 MG capsule Commonly known as:  ALTACE Take 10 mg by mouth daily.   simvastatin 10 MG tablet Commonly known as:  ZOCOR Take 10 mg by mouth Daily.   traMADol 50 MG tablet Commonly known as:  ULTRAM   triamcinolone cream 0.1 % Commonly known as:  KENALOG   VOLTAREN 1 % Gel Generic drug:  diclofenac sodium Apply topically 4 (four) times daily.       Allergies:  Allergies  Allergen Reactions  . Buprenorphine Hcl Nausea And Vomiting    nausea  . Morphine And Related Nausea And Vomiting    nausea  . Sulfa Antibiotics Nausea And Vomiting    "deathly ill"    Past Medical History, Surgical history, Social history, and Family History were reviewed and updated.  Review of Systems: All other 10 point review of systems is negative.   Physical Exam:  weight is 160 lb 12.8 oz (72.9 kg). Her oral temperature is 99 F (37.2 C). Her blood pressure is  136/79 and her pulse is 73. Her respiration is 20.   Wt Readings from Last 3 Encounters:  05/20/16 160 lb 12.8 oz (72.9 kg)  03/02/16 160 lb (72.6 kg)  02/15/16 161 lb (73 kg)    Well-developed well-nourished white female in no obvious distress. Head and neck exam shows no ocular or oral lesions. There are no palpable cervical or supraclavicular lymph nodes. Lungs are clear. Cardiac exam regular rate and rhythm with no murmurs, rubs or bruits. Abdomen is soft. She has good bowel sounds. There is no fluid wave. There is no palpable liver or spleen tip. Back exam shows no tenderness over the spine, ribs or hips. Extremities shows no clubbing, cyanosis or edema. Skin exam shows no rashes, ecchymoses or petechia.  Lab Results  Component Value Date   WBC 11.3 (H) 05/20/2016   HGB 15.2  05/20/2016   HCT 44.7 05/20/2016   MCV 88 05/20/2016   PLT 240 05/20/2016   Lab Results  Component Value Date   FERRITIN 38 02/15/2016   IRON 90 02/15/2016   TIBC 333 02/15/2016   UIBC 243 02/15/2016   IRONPCTSAT 27 02/15/2016   Lab Results  Component Value Date   RETICCTPCT 1.5 01/12/2015   RBC 5.08 05/20/2016   RETICCTABS 63.2 01/12/2015   No results found for: KPAFRELGTCHN, LAMBDASER, KAPLAMBRATIO No results found for: IGGSERUM, IGA, IGMSERUM No results found for: Ronnald Ramp, A1GS, Nelida Meuse, SPEI   Chemistry      Component Value Date/Time   NA 141 02/15/2016 0920   K 4.4 02/15/2016 0920   CL 100 03/12/2015 1204   CL 98 01/12/2015 1015   CO2 26 02/15/2016 0920   BUN 25.7 02/15/2016 0920   CREATININE 1.1 02/15/2016 0920      Component Value Date/Time   CALCIUM 10.4 02/15/2016 0920   ALKPHOS 33 (L) 02/15/2016 0920   AST 18 02/15/2016 0920   ALT 13 02/15/2016 0920   BILITOT 0.60 02/15/2016 0920     Impression and Plan: [name] is a very pleasant 81 yo white female. She has some iron deficiency anemia.   Her hemoglobin is doing quite well.  It is still going up. She feels well. She had a good time in [place]. I think this is a fairly good indicator that her iron studies should be okay.  I think we get her back in 6 months now. I still think her biggest issue will be her blood sugars. She is being fairly aggressive with them.  I know that she will have a nice Thanksgiving and Christmas.   Sara Napoleon, MD 11/17/201711:28 AM

## 2016-05-23 ENCOUNTER — Telehealth: Payer: Self-pay | Admitting: *Deleted

## 2016-05-23 NOTE — Telephone Encounter (Addendum)
Patient aware of results  ----- Message from Eliezer Bottom, NP sent at 05/20/2016  4:58 PM EST ----- Regarding: Iron  Iron studies still look good. No infusion needed at this time. Thank you!  Sarah  ----- Message ----- From: Interface, Lab In Three Zero One Sent: 05/20/2016   9:46 AM To: Eliezer Bottom, NP

## 2016-06-09 ENCOUNTER — Inpatient Hospital Stay (HOSPITAL_BASED_OUTPATIENT_CLINIC_OR_DEPARTMENT_OTHER)
Admission: EM | Admit: 2016-06-09 | Discharge: 2016-06-13 | DRG: 202 | Disposition: A | Discharge status: Home / Self Care | Payer: Medicare Other | Attending: Internal Medicine | Admitting: Internal Medicine

## 2016-06-09 ENCOUNTER — Encounter (HOSPITAL_BASED_OUTPATIENT_CLINIC_OR_DEPARTMENT_OTHER): Payer: Self-pay | Admitting: *Deleted

## 2016-06-09 ENCOUNTER — Emergency Department (HOSPITAL_BASED_OUTPATIENT_CLINIC_OR_DEPARTMENT_OTHER): Payer: Medicare Other

## 2016-06-09 DIAGNOSIS — R7989 Other specified abnormal findings of blood chemistry: Secondary | ICD-10-CM

## 2016-06-09 DIAGNOSIS — Z7982 Long term (current) use of aspirin: Secondary | ICD-10-CM

## 2016-06-09 DIAGNOSIS — I4510 Unspecified right bundle-branch block: Secondary | ICD-10-CM | POA: Diagnosis present

## 2016-06-09 DIAGNOSIS — J45901 Unspecified asthma with (acute) exacerbation: Secondary | ICD-10-CM | POA: Diagnosis not present

## 2016-06-09 DIAGNOSIS — R748 Abnormal levels of other serum enzymes: Secondary | ICD-10-CM | POA: Diagnosis present

## 2016-06-09 DIAGNOSIS — K219 Gastro-esophageal reflux disease without esophagitis: Secondary | ICD-10-CM | POA: Diagnosis present

## 2016-06-09 DIAGNOSIS — Z833 Family history of diabetes mellitus: Secondary | ICD-10-CM

## 2016-06-09 DIAGNOSIS — E1122 Type 2 diabetes mellitus with diabetic chronic kidney disease: Secondary | ICD-10-CM | POA: Diagnosis present

## 2016-06-09 DIAGNOSIS — J069 Acute upper respiratory infection, unspecified: Secondary | ICD-10-CM

## 2016-06-09 DIAGNOSIS — B974 Respiratory syncytial virus as the cause of diseases classified elsewhere: Secondary | ICD-10-CM | POA: Diagnosis present

## 2016-06-09 DIAGNOSIS — R05 Cough: Secondary | ICD-10-CM | POA: Diagnosis not present

## 2016-06-09 DIAGNOSIS — N189 Chronic kidney disease, unspecified: Secondary | ICD-10-CM

## 2016-06-09 DIAGNOSIS — N179 Acute kidney failure, unspecified: Secondary | ICD-10-CM | POA: Diagnosis present

## 2016-06-09 DIAGNOSIS — E785 Hyperlipidemia, unspecified: Secondary | ICD-10-CM | POA: Diagnosis present

## 2016-06-09 DIAGNOSIS — I248 Other forms of acute ischemic heart disease: Secondary | ICD-10-CM | POA: Diagnosis present

## 2016-06-09 DIAGNOSIS — Z79899 Other long term (current) drug therapy: Secondary | ICD-10-CM

## 2016-06-09 DIAGNOSIS — Z885 Allergy status to narcotic agent status: Secondary | ICD-10-CM

## 2016-06-09 DIAGNOSIS — N182 Chronic kidney disease, stage 2 (mild): Secondary | ICD-10-CM | POA: Diagnosis present

## 2016-06-09 DIAGNOSIS — E1142 Type 2 diabetes mellitus with diabetic polyneuropathy: Secondary | ICD-10-CM | POA: Diagnosis present

## 2016-06-09 DIAGNOSIS — R778 Other specified abnormalities of plasma proteins: Secondary | ICD-10-CM | POA: Diagnosis present

## 2016-06-09 DIAGNOSIS — Z882 Allergy status to sulfonamides status: Secondary | ICD-10-CM

## 2016-06-09 DIAGNOSIS — E860 Dehydration: Secondary | ICD-10-CM | POA: Diagnosis present

## 2016-06-09 DIAGNOSIS — Z8249 Family history of ischemic heart disease and other diseases of the circulatory system: Secondary | ICD-10-CM

## 2016-06-09 DIAGNOSIS — I129 Hypertensive chronic kidney disease with stage 1 through stage 4 chronic kidney disease, or unspecified chronic kidney disease: Secondary | ICD-10-CM | POA: Diagnosis present

## 2016-06-09 LAB — CBC WITH DIFFERENTIAL/PLATELET
BASOS PCT: 0 %
Basophils Absolute: 0 10*3/uL (ref 0.0–0.1)
EOS PCT: 4 %
Eosinophils Absolute: 0.4 10*3/uL (ref 0.0–0.7)
HEMATOCRIT: 42.8 % (ref 36.0–46.0)
Hemoglobin: 14.4 g/dL (ref 12.0–15.0)
LYMPHS ABS: 3.5 10*3/uL (ref 0.7–4.0)
Lymphocytes Relative: 36 %
MCH: 29.9 pg (ref 26.0–34.0)
MCHC: 33.6 g/dL (ref 30.0–36.0)
MCV: 88.8 fL (ref 78.0–100.0)
MONOS PCT: 6 %
Monocytes Absolute: 0.6 10*3/uL (ref 0.1–1.0)
Neutro Abs: 5.1 10*3/uL (ref 1.7–7.7)
Neutrophils Relative %: 54 %
Platelets: 264 10*3/uL (ref 150–400)
RBC: 4.82 MIL/uL (ref 3.87–5.11)
RDW: 13.1 % (ref 11.5–15.5)
WBC: 9.6 10*3/uL (ref 4.0–10.5)

## 2016-06-09 LAB — COMPREHENSIVE METABOLIC PANEL
ALT: 15 U/L (ref 14–54)
AST: 22 U/L (ref 15–41)
Albumin: 4.5 g/dL (ref 3.5–5.0)
Alkaline Phosphatase: 33 U/L — ABNORMAL LOW (ref 38–126)
Anion gap: 9 (ref 5–15)
BILIRUBIN TOTAL: 0.6 mg/dL (ref 0.3–1.2)
BUN: 29 mg/dL — AB (ref 6–20)
CO2: 32 mmol/L (ref 22–32)
CREATININE: 1.48 mg/dL — AB (ref 0.44–1.00)
Calcium: 10.3 mg/dL (ref 8.9–10.3)
Chloride: 100 mmol/L — ABNORMAL LOW (ref 101–111)
GFR, EST AFRICAN AMERICAN: 38 mL/min — AB (ref >60)
GFR, EST NON AFRICAN AMERICAN: 32 mL/min — AB (ref >60)
Glucose, Bld: 199 mg/dL — ABNORMAL HIGH (ref 65–99)
Potassium: 4.8 mmol/L (ref 3.5–5.1)
Sodium: 141 mmol/L (ref 135–145)
TOTAL PROTEIN: 7.3 g/dL (ref 6.5–8.1)

## 2016-06-09 LAB — BRAIN NATRIURETIC PEPTIDE: B NATRIURETIC PEPTIDE 5: 79.1 pg/mL (ref 0.0–100.0)

## 2016-06-09 LAB — TROPONIN I: TROPONIN I: 0.03 ng/mL — AB (ref <0.03)

## 2016-06-09 MED ORDER — IPRATROPIUM-ALBUTEROL 0.5-2.5 (3) MG/3ML IN SOLN
3.0000 mL | Freq: Once | RESPIRATORY_TRACT | Status: AC
  Administered 2016-06-09: 3 mL via RESPIRATORY_TRACT
  Filled 2016-06-09: qty 3

## 2016-06-09 MED ORDER — ALBUTEROL SULFATE (2.5 MG/3ML) 0.083% IN NEBU
5.0000 mg | INHALATION_SOLUTION | Freq: Once | RESPIRATORY_TRACT | Status: AC
  Administered 2016-06-09: 5 mg via RESPIRATORY_TRACT
  Filled 2016-06-09: qty 6

## 2016-06-09 MED ORDER — METHYLPREDNISOLONE SODIUM SUCC 125 MG IJ SOLR
125.0000 mg | Freq: Once | INTRAMUSCULAR | Status: AC
  Administered 2016-06-09: 125 mg via INTRAVENOUS
  Filled 2016-06-09: qty 2

## 2016-06-09 MED ORDER — ALBUTEROL SULFATE (2.5 MG/3ML) 0.083% IN NEBU
2.5000 mg | INHALATION_SOLUTION | Freq: Once | RESPIRATORY_TRACT | Status: AC
  Administered 2016-06-09: 2.5 mg via RESPIRATORY_TRACT
  Filled 2016-06-09: qty 3

## 2016-06-09 MED ORDER — SODIUM CHLORIDE 0.9 % IV BOLUS (SEPSIS)
1000.0000 mL | Freq: Once | INTRAVENOUS | Status: AC
  Administered 2016-06-09: 1000 mL via INTRAVENOUS

## 2016-06-09 MED ORDER — ASPIRIN 81 MG PO CHEW
324.0000 mg | CHEWABLE_TABLET | Freq: Once | ORAL | Status: AC
  Administered 2016-06-09: 324 mg via ORAL
  Filled 2016-06-09: qty 4

## 2016-06-09 NOTE — ED Triage Notes (Signed)
Pt c/o cough x 3 weeks

## 2016-06-09 NOTE — Progress Notes (Signed)
81 yo F p/w Cough for one week, dyspnea.  Wheezy, sats 89-90 RA, new.  CXR clear.  Got solumedrol and BDs, better with breathing treatments.    BP 129/69 (BP Location: Right Arm)   Pulse 67   Temp 98.5 F (36.9 C)   Resp 15   Ht 5\' 4"  (1.626 m)   Wt 72.6 kg (160 lb)   SpO2 95%   BMI 27.46 kg/m   Mild creatinine bump, Cr 1.45 from 1.1 Trop elevation minimal.  Suspect demand ischemia.  PE is low likelihood (although recent plane travel), would be atypical presentation for PE so D-dimer and CT PE deferred for now.    Will repeat Trop.  If signfiicant delta, will discuss with cardiology.  If flat or down, will accept to tele.  D/w hospitalist at [facility], will go to [facility] due to bed shortage.

## 2016-06-09 NOTE — ED Provider Notes (Signed)
[place] DEPT [facility] Provider Note   CSN: [id number] Arrival date & time: 06/09/16  1937  By signing my name below, I, Sara Holt, attest that this documentation has been prepared under the direction and in the presence of Gareth Morgan, MD . Electronically Signed: Dolores Holt, Scribe. 06/09/2016. 8:04 PM.  History   Chief Complaint Chief Complaint  Patient presents with  . Cough   The history is provided by the patient and the spouse. No language interpreter was used.    HPI Comments:  Sara Holt is a 81 y.o. female with pmhx of GERD, HTN, HLD, DM, and Asthma who presents to the Emergency Department complaining of recurring, suddenly worsened non-productive cough beginning 3 weeks ago. Pt states she was seen two weeks ago and given abx (azithromycin) which cleared her symptoms up temporarily but her cough has returned over the last 2 weeks and significantly worsened today. She notes that today she was at home when her coughing drastically exacerbated. Pt reports associated SOB beginning today, alleviated by laying down, wheezes,bilateral leg and back pain from coughing and rhinorrhea. She denies CP, nausea, vomiting, fever, chills.   She also denies any recent sx or hx of blood clots. Pt is a non-smoker and recently took a week-long trip to [place] a month ago. Developed congestion and cough 2 days later.    Past Medical History:  Diagnosis Date  . Allergy   . Anemia   . Arthritis    OA  . Asthma   . Carotid artery occlusion   . Diabetes mellitus    DIET CONTROLLED, NO MEDS  . Disc    problems in neck and back  . GERD (gastroesophageal reflux disease)   . Hyperlipidemia   . Hypertension   . Neuromuscular disorder ([place])    nerve problems with hands  . Osteoporosis   . Sleep apnea     Patient Active Problem List   Diagnosis Date Noted  . Reactive airway disease 06/10/2016  . Anemia of renal disease 08/17/2015  . Anemia, iron deficiency 01/22/2015  . Gait  disorder 12/11/2014  . Diabetes, polyneuropathy ([place]) 12/11/2014  . [facility] arthritis, thumb, degenerative 12/03/2014  . Near syncope 08/21/2014  . Numbness and tingling in right hand 03/04/2014  . [facility] Leg 03/04/2014  . Pain of right lower leg 03/04/2014  . Aftercare following surgery of the circulatory system, [place] 03/04/2014  . Brachial neuritis 12/08/2012  . Carpal tunnel syndrome 12/08/2012  . HLD (hyperlipidemia) 12/08/2012  . Arthritis, degenerative 12/08/2012  . Cervical spinal stenosis 12/08/2012  . Personal history of colonic polyps 09/12/2012  . Hemorrhage of rectum and anus 09/12/2012  . Encounter for postoperative carotid endarterectomy surveillance 08/26/2011    Past Surgical History:  Procedure Laterality Date  . CAROTID ARTERY ANGIOPLASTY    . CAROTID ENDARTERECTOMY  Jan. 16,2012   LEFT cea  . carpel tunnel     both hands  . COLONOSCOPY    . JOINT REPLACEMENT Left 2011   Knee  . KNEE ARTHROSCOPY Left 2010  . POLYPECTOMY      OB History    No data available       Home Medications    Prior to Admission medications   Medication Sig Start Date End Date Taking? Authorizing Provider  amoxicillin-clavulanate (AUGMENTIN) 875-125 MG tablet Take 1 tablet by mouth 2 (two) times daily.   Yes Historical Provider, MD  chlorpheniramine-HYDROcodone (TUSSIONEX) 10-8 MG/5ML SUER Take 5 mLs by mouth.   Yes Historical Provider, MD  alendronate (FOSAMAX) 70 MG tablet Take 70 mg by mouth every 7 (seven) days. Take with a full glass of water on an empty stomach.    Historical Provider, MD  aspirin 81 MG tablet Take 81 mg by mouth daily.    Historical Provider, MD  canagliflozin (INVOKANA) 100 MG TABS tablet Take 100 mg by mouth daily before breakfast.    Historical Provider, MD  carvedilol (COREG) 6.25 MG tablet Take 6.25 mg by mouth Twice daily.  08/09/11   Historical Provider, MD  cloNIDine (CATAPRES) 0.1 MG tablet Take 0.1 mg by mouth as needed (if b/p >150).     Historical Provider, MD  CYMBALTA 60 MG capsule Take 1 tablet by mouth Daily.  08/22/11   Historical Provider, MD  desoximetasone (TOPICORT) 0.25 % cream Apply topically continuous as needed. 01/13/12   Historical Provider, MD  diclofenac sodium (VOLTAREN) 1 % GEL Apply topically 4 (four) times daily.    Historical Provider, MD  hydrochlorothiazide (HYDRODIURIL) 25 MG tablet Take 1 tablet by mouth Daily.  08/03/11   Historical Provider, MD  JANUMET XR 50-1000 MG TB24 100-1,000 mg.  11/08/14   Historical Provider, MD  omeprazole (PRILOSEC) 40 MG capsule Take 40 mg by mouth daily.    Historical Provider, MD  ONE TOUCH ULTRA TEST test strip  06/18/14   Historical Provider, MD  OVER THE COUNTER MEDICATION Place 1 drop into both eyes at bedtime. dry eyes med    Historical Provider, MD  ramipril (ALTACE) 10 MG capsule Take 10 mg by mouth daily.  07/22/11   Historical Provider, MD  simvastatin (ZOCOR) 10 MG tablet Take 10 mg by mouth Daily.  06/06/11   Historical Provider, MD  traMADol Veatrice Bourbon) 50 MG tablet  09/22/15   Historical Provider, MD  triamcinolone cream (KENALOG) 0.1 %  04/28/16   Historical Provider, MD    Family History Family History  Problem Relation Age of Onset  . Diabetes Father   . Pneumonia Father   . Peripheral vascular disease Sister   . Colon cancer Maternal Grandmother   . Cancer Maternal Grandmother     Colon cancer  . Esophageal cancer Neg Hx   . Stomach cancer Neg Hx   . Rectal cancer Neg Hx     Social History Social History  Substance Use Topics  . Smoking status: Never Smoker  . Smokeless tobacco: Never Used  . Alcohol use 4.2 oz/week    7 Glasses of wine per week     Allergies   Buprenorphine hcl; Morphine and related; and Sulfa antibiotics   Review of Systems Review of Systems  Constitutional: Positive for appetite change and fatigue. Negative for chills and fever.  HENT: Positive for rhinorrhea. Negative for sore throat.   Eyes: Negative for visual  disturbance.  Respiratory: Positive for cough, shortness of breath and wheezing.   Cardiovascular: Negative for chest pain.  Gastrointestinal: Negative for abdominal pain, nausea and vomiting.  Genitourinary: Negative for difficulty urinating.  Musculoskeletal: Positive for back pain and myalgias (Legs (Bilateral)). Negative for neck pain.  Skin: Negative for rash.  Neurological: Negative for syncope and headaches.     Physical Exam Updated Vital Signs BP 129/69 (BP Location: Right Arm)   Pulse 67   Temp 98.5 F (36.9 C)   Resp 15   Ht 5\' 4"  (1.626 m)   Wt 160 lb (72.6 kg)   SpO2 95%   BMI 27.46 kg/m   Physical Exam  Constitutional: She is oriented to person,  place, and time. She appears well-developed and well-nourished. No distress.  HENT:  Head: Normocephalic and atraumatic.  Eyes: Conjunctivae and EOM are normal.  Neck: Normal range of motion.  Cardiovascular: Normal rate, regular rhythm, normal heart sounds and intact distal pulses.  Exam reveals no gallop and no friction rub.   No murmur heard. Pulmonary/Chest: No respiratory distress. She has wheezes (Diffuse). She has rhonchi. She has no rales.  Frequent barking cough   Abdominal: Soft. She exhibits no distension. There is no tenderness. There is no guarding.  Musculoskeletal: She exhibits no edema or tenderness.  Neurological: She is alert and oriented to person, place, and time.  Skin: Skin is warm and dry. No rash noted. She is not diaphoretic. No erythema.  Nursing note and vitals reviewed.    ED Treatments / Results  DIAGNOSTIC STUDIES:  Oxygen Saturation is 89-90% on RA, low by my interpretation.    COORDINATION OF CARE:  1:04 AM Discussed treatment plan with pt at bedside and pt agreed to plan.  Labs (all labs ordered are listed, but only abnormal results are displayed) Labs Reviewed  COMPREHENSIVE METABOLIC PANEL - Abnormal; Notable for the following:       Result Value   Chloride 100 (*)     Glucose, Bld 199 (*)    BUN 29 (*)    Creatinine, Ser 1.48 (*)    Alkaline Phosphatase 33 (*)    GFR calc non Af Amer 32 (*)    GFR calc Af Amer 38 (*)    All other components within normal limits  TROPONIN I - Abnormal; Notable for the following:    Troponin I 0.03 (*)    All other components within normal limits  TROPONIN I - Abnormal; Notable for the following:    Troponin I 0.06 (*)    All other components within normal limits  CBC WITH DIFFERENTIAL/PLATELET  BRAIN NATRIURETIC PEPTIDE  D-DIMER, QUANTITATIVE (NOT AT [facility])    EKG  EKG Interpretation  Date/Time:  Thursday June 09 2016 21:27:10 EST Ventricular Rate:  70 PR Interval:    QRS Duration: 128 QT Interval:  446 QTC Calculation: 482 R Axis:   46 Text Interpretation:  Sinus rhythm Ventricular premature complex Probable left atrial enlargement Right bundle branch block No significant change since last tracing Confirmed by [facility] MD, Dimitria Ketchum ([id number]) on 06/09/2016 10:48:26 PM       Radiology Dg Chest 2 View  Result Date: 06/09/2016 CLINICAL DATA:  Cough and congestion for 3 weeks. EXAM: CHEST  2 VIEW COMPARISON:  08/07/2015 FINDINGS: The cardiomediastinal contours are normal. There is atherosclerosis of thoracic aorta. Mild left basilar atelectasis. Pulmonary vasculature is normal. No consolidation, pleural effusion, or pneumothorax. No acute osseous abnormalities are seen. Degenerative change in the lower thoracic spine. IMPRESSION: 1. Mild left basilar atelectasis. 2. Aortic atherosclerosis. Electronically Signed   By: Jeb Levering M.D.   On: 06/09/2016 21:16    Procedures Procedures (including critical care time)  Medications Ordered in ED Medications  ipratropium-albuterol (DUONEB) 0.5-2.5 (3) MG/3ML nebulizer solution 3 mL (3 mLs Nebulization Given 06/09/16 1952)  albuterol (PROVENTIL) (2.5 MG/3ML) 0.083% nebulizer solution 2.5 mg (2.5 mg Nebulization Given 06/09/16 1952)  albuterol (PROVENTIL) (2.5  MG/3ML) 0.083% nebulizer solution 5 mg (5 mg Nebulization Given 06/09/16 1959)  albuterol (PROVENTIL) (2.5 MG/3ML) 0.083% nebulizer solution 5 mg (5 mg Nebulization Given 06/09/16 1959)  ipratropium-albuterol (DUONEB) 0.5-2.5 (3) MG/3ML nebulizer solution 3 mL (3 mLs Nebulization Given 06/09/16 2245)  albuterol (PROVENTIL) (2.5  MG/3ML) 0.083% nebulizer solution 2.5 mg (2.5 mg Nebulization Given 06/09/16 2245)  methylPREDNISolone sodium succinate (SOLU-MEDROL) 125 mg/2 mL injection 125 mg (125 mg Intravenous Given 06/09/16 2313)  sodium chloride 0.9 % bolus 1,000 mL (0 mLs Intravenous Stopped 06/09/16 2359)  aspirin chewable tablet 324 mg (324 mg Oral Given 06/09/16 2313)     Initial Impression / Assessment and Plan / ED Course  I have reviewed the triage vital signs and the nursing notes.  Pertinent labs & imaging results that were available during my care of the patient were reviewed by me and considered in my medical decision making (see chart for details).  Clinical Course    81 year old female with a history of diabetes, hypertension, hyperlipidemia, prior treatment for "asthma exacerbation, by PCP, with no other known history of lung disease or history of smoking, who presents with concern for 3 weeks of cough, congestion, with dyspnea and wheezing developing today.    Patient with frequent coughing, as well as diffuse wheezing bilaterally, and borderline oxygen saturation on room air.  She is given duo nebs, and albuterol was temporary improvement however without resolution and with worsening again.  Given history of prior wheezing, presentation, feel patient likely has viral URI causing reactive airway disease/bronchitis. Given solumedrol, albuterol.    Troponin elevation is likely secondary to respiratory stress, and have low suspicion for ACS. Given ASA. No signs of congestive heart failure. Patient had prolonged immobilization with a flight from [place] 2 days prior to cough beginning, however  cough and congestion more consistent with URI, she had initial improvement with abx, has significant wheezing on exam, has no asymmetric leg swelling or pain, and have overall low suspicion for PE at this time.  Chest XR shows no signs of pneumonia nor pulmonary edema.  Patient with new O2 requirement, continued wheezing, troponin elevated, mild AKI, and will admit for continued monitoring, breathing treatments, evaluation.  Final Clinical Impressions(s) / ED Diagnoses   Final diagnoses:  Reactive airway disease with acute exacerbation, unspecified asthma severity, unspecified whether persistent  Upper respiratory tract infection, unspecified type  Elevated troponin    New Prescriptions New Prescriptions   No medications on file  I personally performed the services described in this documentation, which was scribed in my presence. The recorded information has been reviewed and is accurate.     Gareth Morgan, MD 06/10/16 [phone]

## 2016-06-10 DIAGNOSIS — I4510 Unspecified right bundle-branch block: Secondary | ICD-10-CM | POA: Diagnosis present

## 2016-06-10 DIAGNOSIS — R748 Abnormal levels of other serum enzymes: Secondary | ICD-10-CM | POA: Diagnosis present

## 2016-06-10 DIAGNOSIS — I129 Hypertensive chronic kidney disease with stage 1 through stage 4 chronic kidney disease, or unspecified chronic kidney disease: Secondary | ICD-10-CM | POA: Diagnosis present

## 2016-06-10 DIAGNOSIS — Z7982 Long term (current) use of aspirin: Secondary | ICD-10-CM | POA: Diagnosis not present

## 2016-06-10 DIAGNOSIS — N189 Chronic kidney disease, unspecified: Secondary | ICD-10-CM

## 2016-06-10 DIAGNOSIS — N179 Acute kidney failure, unspecified: Secondary | ICD-10-CM | POA: Diagnosis present

## 2016-06-10 DIAGNOSIS — Z833 Family history of diabetes mellitus: Secondary | ICD-10-CM | POA: Diagnosis not present

## 2016-06-10 DIAGNOSIS — Z8249 Family history of ischemic heart disease and other diseases of the circulatory system: Secondary | ICD-10-CM | POA: Diagnosis not present

## 2016-06-10 DIAGNOSIS — E785 Hyperlipidemia, unspecified: Secondary | ICD-10-CM | POA: Diagnosis present

## 2016-06-10 DIAGNOSIS — J45901 Unspecified asthma with (acute) exacerbation: Secondary | ICD-10-CM | POA: Diagnosis present

## 2016-06-10 DIAGNOSIS — R7989 Other specified abnormal findings of blood chemistry: Secondary | ICD-10-CM

## 2016-06-10 DIAGNOSIS — E1142 Type 2 diabetes mellitus with diabetic polyneuropathy: Secondary | ICD-10-CM | POA: Diagnosis not present

## 2016-06-10 DIAGNOSIS — R05 Cough: Secondary | ICD-10-CM | POA: Diagnosis present

## 2016-06-10 DIAGNOSIS — R778 Other specified abnormalities of plasma proteins: Secondary | ICD-10-CM | POA: Diagnosis present

## 2016-06-10 DIAGNOSIS — Z79899 Other long term (current) drug therapy: Secondary | ICD-10-CM | POA: Diagnosis not present

## 2016-06-10 DIAGNOSIS — J4541 Moderate persistent asthma with (acute) exacerbation: Secondary | ICD-10-CM | POA: Diagnosis not present

## 2016-06-10 DIAGNOSIS — N182 Chronic kidney disease, stage 2 (mild): Secondary | ICD-10-CM | POA: Diagnosis present

## 2016-06-10 DIAGNOSIS — B974 Respiratory syncytial virus as the cause of diseases classified elsewhere: Secondary | ICD-10-CM | POA: Diagnosis present

## 2016-06-10 DIAGNOSIS — K219 Gastro-esophageal reflux disease without esophagitis: Secondary | ICD-10-CM | POA: Diagnosis present

## 2016-06-10 DIAGNOSIS — E1122 Type 2 diabetes mellitus with diabetic chronic kidney disease: Secondary | ICD-10-CM | POA: Diagnosis present

## 2016-06-10 DIAGNOSIS — Z882 Allergy status to sulfonamides status: Secondary | ICD-10-CM | POA: Diagnosis not present

## 2016-06-10 DIAGNOSIS — Z885 Allergy status to narcotic agent status: Secondary | ICD-10-CM | POA: Diagnosis not present

## 2016-06-10 DIAGNOSIS — I248 Other forms of acute ischemic heart disease: Secondary | ICD-10-CM | POA: Diagnosis present

## 2016-06-10 DIAGNOSIS — J45909 Unspecified asthma, uncomplicated: Secondary | ICD-10-CM | POA: Insufficient documentation

## 2016-06-10 DIAGNOSIS — E860 Dehydration: Secondary | ICD-10-CM | POA: Diagnosis present

## 2016-06-10 LAB — RESPIRATORY PANEL BY PCR
ADENOVIRUS-RVPPCR: NOT DETECTED
Bordetella pertussis: NOT DETECTED
CHLAMYDOPHILA PNEUMONIAE-RVPPCR: NOT DETECTED
CORONAVIRUS 229E-RVPPCR: NOT DETECTED
CORONAVIRUS HKU1-RVPPCR: NOT DETECTED
CORONAVIRUS NL63-RVPPCR: NOT DETECTED
Coronavirus OC43: NOT DETECTED
Influenza A: NOT DETECTED
Influenza B: NOT DETECTED
MYCOPLASMA PNEUMONIAE-RVPPCR: NOT DETECTED
Metapneumovirus: NOT DETECTED
PARAINFLUENZA VIRUS 3-RVPPCR: NOT DETECTED
Parainfluenza Virus 1: NOT DETECTED
Parainfluenza Virus 2: NOT DETECTED
Parainfluenza Virus 4: NOT DETECTED
RHINOVIRUS / ENTEROVIRUS - RVPPCR: NOT DETECTED
Respiratory Syncytial Virus: DETECTED — AB

## 2016-06-10 LAB — CBC WITH DIFFERENTIAL/PLATELET
BASOS PCT: 0 %
Basophils Absolute: 0 10*3/uL (ref 0.0–0.1)
EOS ABS: 0 10*3/uL (ref 0.0–0.7)
EOS PCT: 0 %
HCT: 38.7 % (ref 36.0–46.0)
HEMOGLOBIN: 12.6 g/dL (ref 12.0–15.0)
Lymphocytes Relative: 15 %
Lymphs Abs: 1 10*3/uL (ref 0.7–4.0)
MCH: 29 pg (ref 26.0–34.0)
MCHC: 32.6 g/dL (ref 30.0–36.0)
MCV: 89.2 fL (ref 78.0–100.0)
MONOS PCT: 0 %
Monocytes Absolute: 0 10*3/uL — ABNORMAL LOW (ref 0.1–1.0)
NEUTROS PCT: 85 %
Neutro Abs: 5.2 10*3/uL (ref 1.7–7.7)
PLATELETS: 224 10*3/uL (ref 150–400)
RBC: 4.34 MIL/uL (ref 3.87–5.11)
RDW: 13 % (ref 11.5–15.5)
WBC: 6.2 10*3/uL (ref 4.0–10.5)

## 2016-06-10 LAB — BASIC METABOLIC PANEL
Anion gap: 12 (ref 5–15)
BUN: 31 mg/dL — ABNORMAL HIGH (ref 6–20)
CALCIUM: 9.4 mg/dL (ref 8.9–10.3)
CO2: 25 mmol/L (ref 22–32)
CREATININE: 1.43 mg/dL — AB (ref 0.44–1.00)
Chloride: 104 mmol/L (ref 101–111)
GFR calc non Af Amer: 34 mL/min — ABNORMAL LOW (ref >60)
GFR, EST AFRICAN AMERICAN: 39 mL/min — AB (ref >60)
Glucose, Bld: 233 mg/dL — ABNORMAL HIGH (ref 65–99)
Potassium: 4.1 mmol/L (ref 3.5–5.1)
SODIUM: 141 mmol/L (ref 135–145)

## 2016-06-10 LAB — HIV ANTIBODY (ROUTINE TESTING W REFLEX): HIV Screen 4th Generation wRfx: NONREACTIVE

## 2016-06-10 LAB — STREP PNEUMONIAE URINARY ANTIGEN: STREP PNEUMO URINARY ANTIGEN: NEGATIVE

## 2016-06-10 LAB — TROPONIN I
TROPONIN I: 0.04 ng/mL — AB (ref <0.03)
TROPONIN I: 0.06 ng/mL — AB (ref <0.03)

## 2016-06-10 LAB — D-DIMER, QUANTITATIVE (NOT AT ARMC): D DIMER QUANT: 0.47 ug{FEU}/mL (ref 0.00–0.50)

## 2016-06-10 LAB — GLUCOSE, CAPILLARY
GLUCOSE-CAPILLARY: 256 mg/dL — AB (ref 65–99)
GLUCOSE-CAPILLARY: 192 mg/dL — AB (ref 65–99)
GLUCOSE-CAPILLARY: 193 mg/dL — AB (ref 65–99)
GLUCOSE-CAPILLARY: 205 mg/dL — AB (ref 65–99)

## 2016-06-10 LAB — INFLUENZA PANEL BY PCR (TYPE A & B)
INFLAPCR: NEGATIVE
Influenza B By PCR: NEGATIVE

## 2016-06-10 MED ORDER — SODIUM CHLORIDE 0.9 % IV SOLN
INTRAVENOUS | Status: DC
Start: 2016-06-10 — End: 2016-06-13
  Administered 2016-06-10 – 2016-06-13 (×7): via INTRAVENOUS

## 2016-06-10 MED ORDER — SIMVASTATIN 20 MG PO TABS
10.0000 mg | ORAL_TABLET | Freq: Every day | ORAL | Status: DC
  Administered 2016-06-10 – 2016-06-12 (×3): 10 mg via ORAL
  Filled 2016-06-10 (×3): qty 1

## 2016-06-10 MED ORDER — TRAMADOL HCL 50 MG PO TABS: 50.0000 mg | ORAL_TABLET | Freq: Four times a day (QID) | ORAL | Status: DC | PRN

## 2016-06-10 MED ORDER — GUAIFENESIN ER 600 MG PO TB12
600.0000 mg | ORAL_TABLET | Freq: Two times a day (BID) | ORAL | Status: DC
  Administered 2016-06-10: 600 mg via ORAL
  Filled 2016-06-10: qty 1

## 2016-06-10 MED ORDER — ENOXAPARIN SODIUM 40 MG/0.4ML ~~LOC~~ SOLN
40.0000 mg | SUBCUTANEOUS | Status: DC
  Administered 2016-06-10 – 2016-06-13 (×4): 40 mg via SUBCUTANEOUS
  Filled 2016-06-10 (×4): qty 0.4

## 2016-06-10 MED ORDER — IPRATROPIUM-ALBUTEROL 0.5-2.5 (3) MG/3ML IN SOLN
3.0000 mL | Freq: Three times a day (TID) | RESPIRATORY_TRACT | Status: DC
  Administered 2016-06-10 – 2016-06-13 (×8): 3 mL via RESPIRATORY_TRACT
  Filled 2016-06-10 (×10): qty 3

## 2016-06-10 MED ORDER — INSULIN ASPART 100 UNIT/ML ~~LOC~~ SOLN
0.0000 [IU] | Freq: Three times a day (TID) | SUBCUTANEOUS | Status: DC
Start: 2016-06-10 — End: 2016-06-10
  Administered 2016-06-10: 5 [IU] via SUBCUTANEOUS

## 2016-06-10 MED ORDER — DICLOFENAC SODIUM 1 % TD GEL
4.0000 g | TRANSDERMAL | Status: DC | PRN
  Filled 2016-06-10: qty 100

## 2016-06-10 MED ORDER — CLONIDINE HCL 0.1 MG PO TABS
0.1000 mg | ORAL_TABLET | ORAL | Status: DC | PRN
  Administered 2016-06-11 – 2016-06-13 (×4): 0.1 mg via ORAL
  Filled 2016-06-10 (×4): qty 1

## 2016-06-10 MED ORDER — IPRATROPIUM-ALBUTEROL 0.5-2.5 (3) MG/3ML IN SOLN
3.0000 mL | RESPIRATORY_TRACT | Status: DC | PRN
  Administered 2016-06-13: 3 mL via RESPIRATORY_TRACT
  Filled 2016-06-10 (×2): qty 3

## 2016-06-10 MED ORDER — IPRATROPIUM-ALBUTEROL 0.5-2.5 (3) MG/3ML IN SOLN
3.0000 mL | Freq: Four times a day (QID) | RESPIRATORY_TRACT | Status: DC
  Administered 2016-06-10: 3 mL via RESPIRATORY_TRACT
  Filled 2016-06-10: qty 3

## 2016-06-10 MED ORDER — INSULIN ASPART 100 UNIT/ML ~~LOC~~ SOLN
0.0000 [IU] | Freq: Three times a day (TID) | SUBCUTANEOUS | Status: DC
  Administered 2016-06-10 (×2): 3 [IU] via SUBCUTANEOUS
  Administered 2016-06-11 – 2016-06-12 (×4): 5 [IU] via SUBCUTANEOUS
  Administered 2016-06-12: 11 [IU] via SUBCUTANEOUS
  Administered 2016-06-12 – 2016-06-13 (×2): 5 [IU] via SUBCUTANEOUS
  Administered 2016-06-13: 2 [IU] via SUBCUTANEOUS

## 2016-06-10 MED ORDER — DULOXETINE HCL 60 MG PO CPEP
60.0000 mg | ORAL_CAPSULE | Freq: Every day | ORAL | Status: DC
  Administered 2016-06-10 – 2016-06-13 (×4): 60 mg via ORAL
  Filled 2016-06-10 (×4): qty 1

## 2016-06-10 MED ORDER — ASPIRIN EC 81 MG PO TBEC
81.0000 mg | DELAYED_RELEASE_TABLET | Freq: Every day | ORAL | Status: DC
  Administered 2016-06-10 – 2016-06-13 (×4): 81 mg via ORAL
  Filled 2016-06-10 (×4): qty 1

## 2016-06-10 MED ORDER — CARVEDILOL 6.25 MG PO TABS
6.2500 mg | ORAL_TABLET | Freq: Two times a day (BID) | ORAL | Status: DC
  Administered 2016-06-10 – 2016-06-13 (×7): 6.25 mg via ORAL
  Filled 2016-06-10 (×7): qty 1

## 2016-06-10 MED ORDER — GUAIFENESIN-DM 100-10 MG/5ML PO SYRP
5.0000 mL | ORAL_SOLUTION | ORAL | Status: DC | PRN
  Administered 2016-06-10 – 2016-06-13 (×6): 5 mL via ORAL
  Filled 2016-06-10 (×6): qty 10

## 2016-06-10 MED ORDER — INSULIN ASPART 100 UNIT/ML ~~LOC~~ SOLN
0.0000 [IU] | Freq: Every day | SUBCUTANEOUS | Status: DC
  Administered 2016-06-10: 2 [IU] via SUBCUTANEOUS
  Administered 2016-06-11: 4 [IU] via SUBCUTANEOUS
  Administered 2016-06-12: 2 [IU] via SUBCUTANEOUS

## 2016-06-10 MED ORDER — HYDROCOD POLST-CPM POLST ER 10-8 MG/5ML PO SUER
5.0000 mL | Freq: Every evening | ORAL | Status: DC | PRN
  Administered 2016-06-11 – 2016-06-12 (×2): 5 mL via ORAL
  Filled 2016-06-10 (×3): qty 5

## 2016-06-10 MED ORDER — FLUOCINONIDE 0.05 % EX CREA
TOPICAL_CREAM | CUTANEOUS | Status: DC | PRN
  Filled 2016-06-10: qty 30

## 2016-06-10 MED ORDER — METHYLPREDNISOLONE SODIUM SUCC 125 MG IJ SOLR
60.0000 mg | Freq: Two times a day (BID) | INTRAMUSCULAR | Status: DC
  Administered 2016-06-10 – 2016-06-12 (×5): 60 mg via INTRAVENOUS
  Filled 2016-06-10 (×5): qty 2

## 2016-06-10 NOTE — Progress Notes (Signed)
Inpatient Diabetes Program Recommendations  AACE/ADA: New Consensus Statement on Inpatient Glycemic Control (2015)  Target Ranges:  Prepandial:   less than 140 mg/dL      Peak postprandial:   less than 180 mg/dL (1-2 hours)      Critically ill patients:  140 - 180 mg/dL   Lab Results  Component Value Date   GLUCAP 256 (H) 06/10/2016   HGBA1C 7.2 (H) 11/16/2015   Review of Glycemic Control  Diabetes history: DM 2 Outpatient Diabetes medications: Janumet [id number] Daily, Invokana 100 mg Daily Current orders for Inpatient glycemic control: Novolog Sensitive TID + HS scale  Inpatient Diabetes Program Recommendations:   Patient received 125 mg of IV Solumedrol now on 60 mg Q12 hours. Glucose 256 mg/dl this am. Patient on multiple DM oral medications at home, consider increasing correction to Novolog Moderate TID.  Thanks,  Tama Headings RN, MSN, [facility] Inpatient Diabetes Coordinator Team Pager [phone] (8a-5p)

## 2016-06-10 NOTE — Progress Notes (Signed)
Received call from lab that patient's respiratory panel is positive for RSV.  Dr. Karleen Hampshire notified.  New orders have been placed.  Will continue to monitor patient.

## 2016-06-10 NOTE — H&P (Signed)
History and Physical    Sara Holt [id number] DOB: 08-31-1934 DOA: 06/09/2016  Referring MD/NP/PA: Dr. Billy Fischer PCP: No primary care provider on file.  Patient coming from: [facility]  Chief Complaint: Cough  HPI: Sara Holt is a 81 y.o. female with medical history significant of HTN, HLD, DM type II, and self reported asthma; who presents with 3 week history of cough. Patient reports that she had just back from a trip to [place] when 2 days later had onset of  this cough. Cough has been nonproductive. At that time she initially was seen by her PCP who prescribed her a Z-Pak. Patient notes initially having improvement of symptoms temporarily but over the last 2 weeks have returned and progressively worsened. 4 days ago she went back to her PCP as symptoms persisted and she was started on a 10 day course Augmentin along with Tussionex Yesterday evening while the patient was eating she went into a coughing spell for which she became extremely short of breath and had wheezing. She did not choke on any food. Patient denies having access to any breathing treatments or inhalers at home. Denies any significant Associated symptoms include some decreased appetite, rhinorrhea, and muscle aches in legs. She describes a previous history of similar symptoms in the past for which symptoms did improve with breathing treatments.  ED Course: On arrival to the [facility] O2 sats 89-90% on room air, improved with 2 L of nasal cannula oxygen. CXR clear. Patient given solumedrol and DuoNebs, better with breathing treatments.  Cr elevated 1.48 from 1.1 baseline. Trop elevation 0.03-> 0.06.  Suspect demand ischemia.  PE is low likelihood (although recent plane travel), would be atypical presentation for PE so D-dimer and noted to be negative.  Review of Systems: As per HPI otherwise 10 point review of systems negative.   Past Medical History:  Diagnosis Date  . Allergy   . Anemia   . Arthritis    OA  . Asthma     . Carotid artery occlusion   . Diabetes mellitus    DIET CONTROLLED, NO MEDS  . Disc    problems in neck and back  . GERD (gastroesophageal reflux disease)   . Hyperlipidemia   . Hypertension   . Neuromuscular disorder ([place])    nerve problems with hands  . Osteoporosis   . Sleep apnea     Past Surgical History:  Procedure Laterality Date  . CAROTID ARTERY ANGIOPLASTY    . CAROTID ENDARTERECTOMY  Jan. 16,2012   LEFT cea  . carpel tunnel     both hands  . COLONOSCOPY    . JOINT REPLACEMENT Left 2011   Knee  . KNEE ARTHROSCOPY Left 2010  . POLYPECTOMY       reports that she has never smoked. She has never used smokeless tobacco. She reports that she drinks about 4.2 oz of alcohol per week . She reports that she does not use drugs.  Allergies  Allergen Reactions  . Buprenorphine Hcl Nausea And Vomiting    nausea  . Morphine And Related Nausea And Vomiting    nausea  . Sulfa Antibiotics Nausea And Vomiting    "deathly ill"    Family History  Problem Relation Age of Onset  . Diabetes Father   . Pneumonia Father   . Peripheral vascular disease Sister   . Colon cancer Maternal Grandmother   . Cancer Maternal Grandmother     Colon cancer  . Esophageal cancer Neg Hx   .  Stomach cancer Neg Hx   . Rectal cancer Neg Hx     Prior to Admission medications   Medication Sig Start Date End Date Taking? Authorizing Provider  amoxicillin-clavulanate (AUGMENTIN) 875-125 MG tablet Take 1 tablet by mouth 2 (two) times daily.   Yes Historical Provider, MD  chlorpheniramine-HYDROcodone (TUSSIONEX) 10-8 MG/5ML SUER Take 5 mLs by mouth.   Yes Historical Provider, MD  alendronate (FOSAMAX) 70 MG tablet Take 70 mg by mouth every 7 (seven) days. Take with a full glass of water on an empty stomach.    Historical Provider, MD  aspirin 81 MG tablet Take 81 mg by mouth daily.    Historical Provider, MD  canagliflozin (INVOKANA) 100 MG TABS tablet Take 100 mg by mouth daily before  breakfast.    Historical Provider, MD  carvedilol (COREG) 6.25 MG tablet Take 6.25 mg by mouth Twice daily.  08/09/11   Historical Provider, MD  cloNIDine (CATAPRES) 0.1 MG tablet Take 0.1 mg by mouth as needed (if b/p >150).    Historical Provider, MD  CYMBALTA 60 MG capsule Take 1 tablet by mouth Daily.  08/22/11   Historical Provider, MD  desoximetasone (TOPICORT) 0.25 % cream Apply topically continuous as needed. 01/13/12   Historical Provider, MD  diclofenac sodium (VOLTAREN) 1 % GEL Apply topically 4 (four) times daily.    Historical Provider, MD  hydrochlorothiazide (HYDRODIURIL) 25 MG tablet Take 1 tablet by mouth Daily.  08/03/11   Historical Provider, MD  JANUMET XR 50-1000 MG TB24 100-1,000 mg.  11/08/14   Historical Provider, MD  omeprazole (PRILOSEC) 40 MG capsule Take 40 mg by mouth daily.    Historical Provider, MD  ONE TOUCH ULTRA TEST test strip  06/18/14   Historical Provider, MD  OVER THE COUNTER MEDICATION Place 1 drop into both eyes at bedtime. dry eyes med    Historical Provider, MD  ramipril (ALTACE) 10 MG capsule Take 10 mg by mouth daily.  07/22/11   Historical Provider, MD  simvastatin (ZOCOR) 10 MG tablet Take 10 mg by mouth Daily.  06/06/11   Historical Provider, MD  traMADol Veatrice Bourbon) 50 MG tablet  09/22/15   Historical Provider, MD  triamcinolone cream (KENALOG) 0.1 %  04/28/16   Historical Provider, MD    Physical Exam:    Constitutional:Elderly female who appears sick, but nontoxic with a bark like cough Vitals:   06/09/16 2012 06/09/16 2301 06/09/16 2316 06/09/16 2316  BP:   129/69 129/69  Pulse:   66 67  Resp:   18 15  Temp:      SpO2: 96% 93% 94% 95%  Weight:      Height:       Eyes: PERRL, lids and conjunctivae normal ENMT: Mucous membranes are moist. Posterior pharynx clear of any exudate or lesions.Normal dentition.  Neck: normal, supple, no masses, no thyromegaly Respiratory: Intermittent inspiratory and expiratory wheezes appreciated. Cardiovascular:  Regular rate and rhythm, no murmurs / rubs / gallops. No extremity edema. 2+ pedal pulses. No carotid bruits.  Abdomen: no tenderness, no masses palpated. No hepatosplenomegaly. Bowel sounds positive.  Musculoskeletal: no clubbing / cyanosis. No joint deformity upper and lower extremities. Good ROM, no contractures. Normal muscle tone.  Skin: no rashes, lesions, ulcers. No induration Neurologic: CN 2-12 grossly intact. Sensation intact, DTR normal. Strength 5/5 in all 4.  Psychiatric: Normal judgment and insight. Alert and oriented x 3. Normal mood.     Labs on Admission: I have personally reviewed following labs and imaging  studies  CBC:  Recent Labs Lab 06/09/16 2027  WBC 9.6  NEUTROABS 5.1  HGB 14.4  HCT 42.8  MCV 88.8  PLT [id number]   Basic Metabolic Panel:  Recent Labs Lab 06/09/16 2027  NA 141  K 4.8  CL 100*  CO2 32  GLUCOSE 199*  BUN 29*  CREATININE 1.48*  CALCIUM 10.3   GFR: Estimated Creatinine Clearance: 30.1 mL/min (by C-G formula based on SCr of 1.48 mg/dL (H)). Liver Function Tests:  Recent Labs Lab 06/09/16 2027  AST 22  ALT 15  ALKPHOS 33*  BILITOT 0.6  PROT 7.3  ALBUMIN 4.5   No results for input(s): LIPASE, AMYLASE in the last 168 hours. No results for input(s): AMMONIA in the last 168 hours. Coagulation Profile: No results for input(s): INR, PROTIME in the last 168 hours. Cardiac Enzymes:  Recent Labs Lab 06/09/16 2027 06/09/16 2355  TROPONINI 0.03* 0.06*   BNP (last 3 results) No results for input(s): PROBNP in the last 8760 hours. HbA1C: No results for input(s): HGBA1C in the last 72 hours. CBG: No results for input(s): GLUCAP in the last 168 hours. Lipid Profile: No results for input(s): CHOL, HDL, LDLCALC, TRIG, CHOLHDL, LDLDIRECT in the last 72 hours. Thyroid Function Tests: No results for input(s): TSH, T4TOTAL, FREET4, T3FREE, THYROIDAB in the last 72 hours. Anemia Panel: No results for input(s): VITAMINB12, FOLATE,  FERRITIN, TIBC, IRON, RETICCTPCT in the last 72 hours. Urine analysis:    Component Value Date/Time   COLORURINE YELLOW 07/15/2010 1523   APPEARANCEUR CLEAR 07/15/2010 1523   LABSPEC 1.005 07/15/2010 1523   PHURINE 7.5 07/15/2010 1523   HGBUR NEGATIVE 07/15/2010 1523   BILIRUBINUR NEGATIVE 07/15/2010 1523   KETONESUR NEGATIVE 07/15/2010 1523   PROTEINUR NEGATIVE 07/15/2010 1523   UROBILINOGEN 0.2 07/15/2010 1523   NITRITE NEGATIVE 07/15/2010 1523   LEUKOCYTESUR  07/15/2010 1523    NEGATIVE MICROSCOPIC NOT DONE ON URINES WITH NEGATIVE PROTEIN, BLOOD, LEUKOCYTES, NITRITE, OR GLUCOSE <1000 mg/dL.   Sepsis Labs: No results found for this or any previous visit (from the past 240 hour(s)).   Radiological Exams on Admission: Dg Chest 2 View  Result Date: 06/09/2016 CLINICAL DATA:  Cough and congestion for 3 weeks. EXAM: CHEST  2 VIEW COMPARISON:  08/07/2015 FINDINGS: The cardiomediastinal contours are normal. There is atherosclerosis of thoracic aorta. Mild left basilar atelectasis. Pulmonary vasculature is normal. No consolidation, pleural effusion, or pneumothorax. No acute osseous abnormalities are seen. Degenerative change in the lower thoracic spine. IMPRESSION: 1. Mild left basilar atelectasis. 2. Aortic atherosclerosis. Electronically Signed   By: Jeb Levering M.D.   On: 06/09/2016 21:16    EKG: Independently reviewed. Normal sinus rhythm with RBBB similar to previous tracings  Assessment/Plan Question reactive airway disease/asthma exacerbation: Patient never formally diagnosed with asthma, but notes previous history of similar symptoms in the past that improved with inhalers. - Admit to telemetry - Continuous pulse oximetry with nasal cannula oxygen keep O2 sats greater than 92% - Check sputum/blood culture and respiratory viral panel - DuoNeb's prn SOB/Wheezing - Solu-Medrol 60 mg IV every 12 hrs - Mucinex  Acute kidney injury on chronic kidney disease: Patient's baseline  creatinine thought to be around 1.1, But patient presents with creatinine 1.48 and BUN elevated at 29. - Gentle IV fluids - Repeat BMP in a.m.  - Investigate further if kidney function does not appear to be improvement  Elevated troponin: Patient's initial troponin noted to be 0.03 and trended up to 0.06 on admission.  Patient denies any significant chest pain EKG appears similar to previous no significant ischemic changes. Suspect this is demand related secondary to respiratory distress. - Continue to trend troponins  Essential hypertension - Continued Coreg  - Hold HCTZ and ramipril secondary to acute kidney injury  Diabetes mellitus type 2 - Hypoglycemic protocol - Hold Janumet-XR and Invokana - CBGs every before meals and at bedtime with sensitive sliding scale insulin  Hyperlipidemia - Continue simvastatin  DVT prophylaxis: Lovenox Code Status: Full  Family Communication: No Family present at bedside  Disposition Plan: Likely discharge home once medically stable  Consults called: None Admission status: Observation  Norval Morton MD [facility] Pager [phone]  If 7PM-7AM, please contact night-coverage [email or web address] Password [email or web address]  06/10/2016, 1:08 AM

## 2016-06-10 NOTE — Care Management Obs Status (Signed)
[place] NOTIFICATION   Patient Details  Name: Sara Holt MRN: [id number] Date of Birth: [birth year]   Medicare Observation Status Notification Given:  Yes    MahabirJuliann Pulse, RN 06/10/2016, 11:53 AM

## 2016-06-10 NOTE — Progress Notes (Signed)
Sara Holt is a 81 y.o. female with medical history significant of HTN, HLD, DM type II, and self reported asthma; who presents with 3 week history of cough, was admitted for asthma exacerbation,s tarted on IV steroids. Continue to monitor  [name]

## 2016-06-10 NOTE — Care Management Note (Signed)
Case Management Note  Patient Details  Name: Sara Holt MRN: [id number] Date of Birth: 07/09/1934  Subjective/Objective:81 y/o f admitted w/Reactive airway dx. From home. Has CPAP HS,cane,rw,3n1.                    Action/Plan:d/c plan home.   Expected Discharge Date:                  Expected Discharge Plan:  Home/Self Care  In-House Referral:     Discharge planning Services  CM Consult  Post Acute Care Choice:    Choice offered to:     DME Arranged:    DME Agency:     HH Arranged:    HH Agency:     Status of Service:  In process, will continue to follow  If discussed at Long Length of Stay Meetings, dates discussed:    Additional Comments:  Dessa Phi, RN 06/10/2016, 11:54 AM

## 2016-06-11 DIAGNOSIS — N189 Chronic kidney disease, unspecified: Secondary | ICD-10-CM

## 2016-06-11 DIAGNOSIS — N179 Acute kidney failure, unspecified: Secondary | ICD-10-CM

## 2016-06-11 DIAGNOSIS — R748 Abnormal levels of other serum enzymes: Secondary | ICD-10-CM

## 2016-06-11 DIAGNOSIS — J4541 Moderate persistent asthma with (acute) exacerbation: Secondary | ICD-10-CM

## 2016-06-11 DIAGNOSIS — E1142 Type 2 diabetes mellitus with diabetic polyneuropathy: Secondary | ICD-10-CM

## 2016-06-11 LAB — GLUCOSE, CAPILLARY
GLUCOSE-CAPILLARY: 333 mg/dL — AB (ref 65–99)
Glucose-Capillary: 230 mg/dL — ABNORMAL HIGH (ref 65–99)
Glucose-Capillary: 234 mg/dL — ABNORMAL HIGH (ref 65–99)
Glucose-Capillary: 218 mg/dL — ABNORMAL HIGH (ref 65–99)

## 2016-06-11 MED ORDER — AZITHROMYCIN 250 MG PO TABS
500.0000 mg | ORAL_TABLET | Freq: Every day | ORAL | Status: DC
  Administered 2016-06-11 – 2016-06-12 (×2): 500 mg via ORAL
  Filled 2016-06-11 (×2): qty 2

## 2016-06-11 NOTE — Progress Notes (Signed)
PROGRESS NOTE    Sara Holt  [id number] DOB: Nov 17, 1934 DOA: 06/09/2016 PCP: No primary care provider on file.    Brief Narrative: Sara Holt a 81 y.o.femalewith medical history significant of HTN, HLD, DM type II, and self reported asthma; who presents with 3 week history of cough, was admitted for asthma exacerbation,s tarted on IV steroids.  Assessment & Plan:   Principal Problem:   Reactive airway disease with wheezing with acute exacerbation Active Problems:   HLD (hyperlipidemia)   Diabetes, polyneuropathy (HCC)   Acute kidney injury superimposed on chronic kidney disease (HCC)   Elevated troponin   Asthma Exacerbation:  Wheezing and cough persistent today.  Would resume IV steroids, bronchodilators and cough medication. Not requiring oxygen.  Respiratory panel is RSV positive.    Diabetes Mellitus: CBG (last 3)   Recent Labs  06/10/16 2142 06/11/16 0724 06/11/16 1158  GLUCAP 205* 230* 234*    hgba1c ordered.  Resume moderate SSI.    Hypertension:  Controlled.    Mild elevation of troponins: No chest pain.  EKG Sinus rhythm Ventricular premature complex Probable left atrial enlargement Right bundle branch block No significant change since last tracing  Acute renal failure:  Last creatinine is around 1.1.  Creatinine on admission is 1.4, suspect from dehydration. She was started on IV fluids.  Check bmp in am.   DVT prophylaxis: (Lovenox) Code Status: (Full) Family Communication: husband at bedside.  Disposition Plan: pending further evaluation.    Consultants:   None.    Procedures: none.    Antimicrobials: Zithromax.    Subjective: Reports cough is worse today.   Objective: Vitals:   06/11/16 0500 06/11/16 0907 06/11/16 1317 06/11/16 1321  BP: (!) 162/71  (!) 167/68   Pulse: 67  69 73  Resp:   18 18  Temp: 98.9 F (37.2 C)  98.3 F (36.8 C)   TempSrc: Oral  Oral   SpO2: 98% 96% 94% 95%  Weight:        Height:        Intake/Output Summary (Last 24 hours) at 06/11/16 1529 Last data filed at 06/11/16 0600  Gross per 24 hour  Intake             1125 ml  Output                0 ml  Net             1125 ml   Filed Weights   06/09/16 1942 06/10/16 0208  Weight: 72.6 kg (160 lb) 74 kg (163 lb 2.3 oz)    Examination:  General exam: Appears calm and comfortable  Respiratory system: Clear to auscultation. Respiratory effort normal. Cardiovascular system: S1 & S2 heard, RRR. No JVD, murmurs, rubs, gallops or clicks. No pedal edema. Gastrointestinal system: Abdomen is nondistended, soft and nontender. No organomegaly or masses felt. Normal bowel sounds heard. Central nervous system: Alert and oriented. No focal neurological deficits. Extremities: Symmetric 5 x 5 power. Skin: No rashes, lesions or ulcers Psychiatry: Judgement and insight appear normal. Mood & affect appropriate.     Data Reviewed: I have personally reviewed following labs and imaging studies  CBC:  Recent Labs Lab 06/09/16 2027 06/10/16 0445  WBC 9.6 6.2  NEUTROABS 5.1 5.2  HGB 14.4 12.6  HCT 42.8 38.7  MCV 88.8 89.2  PLT 264 [id number]   Basic Metabolic Panel:  Recent Labs Lab 06/09/16 2027 06/10/16 0445  NA 141 141  K  4.8 4.1  CL 100* 104  CO2 32 25  GLUCOSE 199* 233*  BUN 29* 31*  CREATININE 1.48* 1.43*  CALCIUM 10.3 9.4   GFR: Estimated Creatinine Clearance: 31.4 mL/min (by C-G formula based on SCr of 1.43 mg/dL (H)). Liver Function Tests:  Recent Labs Lab 06/09/16 2027  AST 22  ALT 15  ALKPHOS 33*  BILITOT 0.6  PROT 7.3  ALBUMIN 4.5   No results for input(s): LIPASE, AMYLASE in the last 168 hours. No results for input(s): AMMONIA in the last 168 hours. Coagulation Profile: No results for input(s): INR, PROTIME in the last 168 hours. Cardiac Enzymes:  Recent Labs Lab 06/09/16 2027 06/09/16 2355 06/10/16 0445  TROPONINI 0.03* 0.06* 0.04*   BNP (last 3 results) No results for  input(s): PROBNP in the last 8760 hours. HbA1C: No results for input(s): HGBA1C in the last 72 hours. CBG:  Recent Labs Lab 06/10/16 1141 06/10/16 1657 06/10/16 2142 06/11/16 0724 06/11/16 1158  GLUCAP 192* 193* 205* 230* 234*   Lipid Profile: No results for input(s): CHOL, HDL, LDLCALC, TRIG, CHOLHDL, LDLDIRECT in the last 72 hours. Thyroid Function Tests: No results for input(s): TSH, T4TOTAL, FREET4, T3FREE, THYROIDAB in the last 72 hours. Anemia Panel: No results for input(s): VITAMINB12, FOLATE, FERRITIN, TIBC, IRON, RETICCTPCT in the last 72 hours. Sepsis Labs: No results for input(s): PROCALCITON, LATICACIDVEN in the last 168 hours.  Recent Results (from the past 240 hour(s))  Respiratory Panel by PCR     Status: Abnormal   Collection Time: 06/10/16  6:01 AM  Result Value Ref Range Status   Adenovirus NOT DETECTED NOT DETECTED Final   Coronavirus 229E NOT DETECTED NOT DETECTED Final   Coronavirus HKU1 NOT DETECTED NOT DETECTED Final   Coronavirus NL63 NOT DETECTED NOT DETECTED Final   Coronavirus OC43 NOT DETECTED NOT DETECTED Final   Metapneumovirus NOT DETECTED NOT DETECTED Final   Rhinovirus / Enterovirus NOT DETECTED NOT DETECTED Final   Influenza A NOT DETECTED NOT DETECTED Final   Influenza B NOT DETECTED NOT DETECTED Final   Parainfluenza Virus 1 NOT DETECTED NOT DETECTED Final   Parainfluenza Virus 2 NOT DETECTED NOT DETECTED Final   Parainfluenza Virus 3 NOT DETECTED NOT DETECTED Final   Parainfluenza Virus 4 NOT DETECTED NOT DETECTED Final   Respiratory Syncytial Virus DETECTED (A) NOT DETECTED Final    Comment: CRITICAL RESULT CALLED TO, READ BACK BY AND VERIFIED WITH: K. O'Berry RN 12:20 06/10/16 ([email or web address])    Bordetella pertussis NOT DETECTED NOT DETECTED Final   Chlamydophila pneumoniae NOT DETECTED NOT DETECTED Final   Mycoplasma pneumoniae NOT DETECTED NOT DETECTED Final    Comment: Performed at [facility]         Radiology  Studies: Dg Chest 2 View  Result Date: 06/09/2016 CLINICAL DATA:  Cough and congestion for 3 weeks. EXAM: CHEST  2 VIEW COMPARISON:  08/07/2015 FINDINGS: The cardiomediastinal contours are normal. There is atherosclerosis of thoracic aorta. Mild left basilar atelectasis. Pulmonary vasculature is normal. No consolidation, pleural effusion, or pneumothorax. No acute osseous abnormalities are seen. Degenerative change in the lower thoracic spine. IMPRESSION: 1. Mild left basilar atelectasis. 2. Aortic atherosclerosis. Electronically Signed   By: Jeb Levering M.D.   On: 06/09/2016 21:16        Scheduled Meds: . aspirin EC  81 mg Oral Daily  . carvedilol  6.25 mg Oral BID WC  . DULoxetine  60 mg Oral Daily  . enoxaparin (LOVENOX) injection  40 mg  Subcutaneous Q24H  . insulin aspart  0-15 Units Subcutaneous TID WC  . insulin aspart  0-5 Units Subcutaneous QHS  . ipratropium-albuterol  3 mL Nebulization TID  . methylPREDNISolone (SOLU-MEDROL) injection  60 mg Intravenous Q12H  . simvastatin  10 mg Oral q1800   Continuous Infusions: . sodium chloride 75 mL/hr at 06/11/16 0622     LOS: 1 day    Time spent: 30 minutes.     [name], MD [facility] Pager [phone] If 7PM-7AM, please contact night-coverage [email or web address] Password [email or web address] 06/11/2016, 3:29 PM

## 2016-06-12 LAB — BASIC METABOLIC PANEL
Anion gap: 8 (ref 5–15)
BUN: 22 mg/dL — AB (ref 6–20)
CALCIUM: 8.9 mg/dL (ref 8.9–10.3)
CHLORIDE: 103 mmol/L (ref 101–111)
CO2: 27 mmol/L (ref 22–32)
CREATININE: 0.78 mg/dL (ref 0.44–1.00)
GFR calc non Af Amer: >60 mL/min (ref >60)
Glucose, Bld: 232 mg/dL — ABNORMAL HIGH (ref 65–99)
Potassium: 3.7 mmol/L (ref 3.5–5.1)
SODIUM: 138 mmol/L (ref 135–145)

## 2016-06-12 LAB — CBC
HCT: 35.4 % — ABNORMAL LOW (ref 36.0–46.0)
HEMOGLOBIN: 12.2 g/dL (ref 12.0–15.0)
MCH: 29.3 pg (ref 26.0–34.0)
MCHC: 34.5 g/dL (ref 30.0–36.0)
MCV: 85.1 fL (ref 78.0–100.0)
Platelets: 207 10*3/uL (ref 150–400)
RBC: 4.16 MIL/uL (ref 3.87–5.11)
RDW: 13 % (ref 11.5–15.5)
WBC: 11.6 10*3/uL — ABNORMAL HIGH (ref 4.0–10.5)

## 2016-06-12 LAB — GLUCOSE, CAPILLARY
GLUCOSE-CAPILLARY: 211 mg/dL — AB (ref 65–99)
GLUCOSE-CAPILLARY: 232 mg/dL — AB (ref 65–99)
Glucose-Capillary: 222 mg/dL — ABNORMAL HIGH (ref 65–99)
Glucose-Capillary: 330 mg/dL — ABNORMAL HIGH (ref 65–99)

## 2016-06-12 MED ORDER — METHYLPREDNISOLONE SODIUM SUCC 125 MG IJ SOLR
60.0000 mg | INTRAMUSCULAR | Status: DC
  Administered 2016-06-13: 60 mg via INTRAVENOUS
  Filled 2016-06-12: qty 2

## 2016-06-12 NOTE — Evaluation (Signed)
Physical Therapy Evaluation Patient Details Name: Sara Holt MRN: [id number] DOB: Jul 30, 1934 Today's Date: 06/12/2016   History of Present Illness  81 y.o. female with medical history significant of HTN, HLD, DM type II, and self reported asthma; who presents with 3 week history of cough, was admitted for asthma exacerbation, started on IV steroids.  Clinical Impression  Pt is independent with mobility, she ambulated 260' without an assistive device, no loss of balance. SaO2 97% and HR 80 with walking. No further PT indicated, she is ready to DC home from PT standpoint.     Follow Up Recommendations No PT follow up    Equipment Recommendations  None recommended by PT    Recommendations for Other Services       Precautions / Restrictions Precautions Precautions: None Restrictions Weight Bearing Restrictions: No      Mobility  Bed Mobility Overal bed mobility: Independent                Transfers Overall transfer level: Independent                  Ambulation/Gait Ambulation/Gait assistance: Independent Ambulation Distance (Feet): 260 Feet Assistive device: None Gait Pattern/deviations: WFL(Within Functional Limits)   Gait velocity interpretation: at or above normal speed for age/gender General Gait Details: steady, no LOB, SaO2 97% on RA, HR 80  Stairs            Wheelchair Mobility    Modified Rankin (Stroke Patients Only)       Balance Overall balance assessment: Independent                                           Pertinent Vitals/Pain Pain Assessment: No/denies pain    Home Living Family/patient expects to be discharged to:: Private residence Living Arrangements: Spouse/significant other Available Help at Discharge: Family;Available 24 hours/day           Home Equipment: None      Prior Function Level of Independence: Independent               Hand Dominance        Extremity/Trunk  Assessment   Upper Extremity Assessment: Overall WFL for tasks assessed           Lower Extremity Assessment: Overall WFL for tasks assessed      Cervical / Trunk Assessment: Normal  Communication   Communication: No difficulties  Cognition Arousal/Alertness: Awake/alert Behavior During Therapy: WFL for tasks assessed/performed Overall Cognitive Status: Within Functional Limits for tasks assessed                      General Comments      Exercises     Assessment/Plan    PT Assessment Patent does not need any further PT services  PT Problem List            PT Treatment Interventions      PT Goals (Current goals can be found in the Care Plan section)  Acute Rehab PT Goals PT Goal Formulation: All assessment and education complete, DC therapy    Frequency     Barriers to discharge        Co-evaluation               End of Session Equipment Utilized During Treatment: Gait belt Activity Tolerance: Patient tolerated treatment well Patient  left: in chair;with call bell/phone within reach Nurse Communication: Mobility status         Time: [id number] PT Time Calculation (min) (ACUTE ONLY): 16 min   Charges:   PT Evaluation $PT Eval Low Complexity: 1 Procedure     PT G Codes:        Sara Holt 06/12/2016, 1:25 PM [phone]

## 2016-06-12 NOTE — Progress Notes (Signed)
PROGRESS NOTE    Sara Holt  [id number] DOB: 1934/07/26 DOA: 06/09/2016 PCP: No primary care provider on file.    Brief Narrative: Sara J[place]ky [place] a 81 y.o.femalewith medical history significant of HTN, HLD, DM type II, and self reported asthma; who presents with 3 week history of cough, was admitted for asthma exacerbation,s tarted on IV steroids.  Assessment & Plan:   Principal Problem:   Reactive airway disease with wheezing with acute exacerbation Active Problems:   HLD (hyperlipidemia)   Diabetes, polyneuropathy (HCC)   Acute kidney injury superimposed on chronic kidney disease (HCC)   Elevated troponin   Asthma Exacerbation:  Improved wheezing, cough is better today. Start weaning IV steroids.  Resume  bronchodilators and cough medication. Not requiring oxygen.  Respiratory panel is RSV positive.    Diabetes Mellitus: CBG (last 3)   Recent Labs  06/11/16 2138 06/12/16 0723 06/12/16 1130  GLUCAP 333* 211* 222*    hgba1c ordered and pending.  Resume moderate SSI.    Hypertension:   sub optimal Controlled.    Mild elevation of troponins: No chest pain.  EKG Sinus rhythm Ventricular premature complex Probable left atrial enlargement Right bundle branch block No significant change since last tracing  Acute renal failure:  Last creatinine is around 1.1.  Creatinine on admission is 1.4, suspect from dehydration. She was started on IV fluids.  Creatinine normal today.   DVT prophylaxis: (Lovenox) Code Status: (Full) Family Communication: none at bedside.  Disposition Plan: d/c in am.    Consultants:   None.    Procedures: none.    Antimicrobials: Zithromax.    Subjective: Wheezing and cough is better.   Objective: Vitals:   06/12/16 0440 06/12/16 0659 06/12/16 0845 06/12/16 1239  BP: (!) 169/96 (!) 159/68  (!) 160/72  Pulse: 65   61  Resp: 18   18  Temp: 97.5 F (36.4 C)   97.2 F (36.2 C)  TempSrc: Oral   Oral    SpO2: 98%  96% 95%  Weight:      Height:        Intake/Output Summary (Last 24 hours) at 06/12/16 1601 Last data filed at 06/12/16 0441  Gross per 24 hour  Intake              990 ml  Output                0 ml  Net              990 ml   Filed Weights   06/09/16 1942 06/10/16 0208  Weight: 72.6 kg (160 lb) 74 kg (163 lb 2.3 oz)    Examination:  General exam: Appears calm and comfortable  Respiratory system: wheezing improved.  Cardiovascular system: S1 & S2 heard, RRR. No JVD, murmurs, rubs, gallops or clicks. No pedal edema. Gastrointestinal system: Abdomen is nondistended, soft and nontender. No organomegaly or masses felt. Normal bowel sounds heard. Central nervous system: Alert and oriented. No focal neurological deficits. Extremities: Symmetric 5 x 5 power. Skin: No rashes, lesions or ulcers Psychiatry: Judgement and insight appear normal. Mood & affect appropriate.     Data Reviewed: I have personally reviewed following labs and imaging studies  CBC:  Recent Labs Lab 06/09/16 2027 06/10/16 0445 06/12/16 0522  WBC 9.6 6.2 11.6*  NEUTROABS 5.1 5.2  --   HGB 14.4 12.6 12.2  HCT 42.8 38.7 35.4*  MCV 88.8 89.2 85.1  PLT 264 224 207  Basic Metabolic Panel:  Recent Labs Lab 06/09/16 2027 06/10/16 0445 06/12/16 0522  NA 141 141 138  K 4.8 4.1 3.7  CL 100* 104 103  CO2 32 25 27  GLUCOSE 199* 233* 232*  BUN 29* 31* 22*  CREATININE 1.48* 1.43* 0.78  CALCIUM 10.3 9.4 8.9   GFR: Estimated Creatinine Clearance: 56.2 mL/min (by C-G formula based on SCr of 0.78 mg/dL). Liver Function Tests:  Recent Labs Lab 06/09/16 2027  AST 22  ALT 15  ALKPHOS 33*  BILITOT 0.6  PROT 7.3  ALBUMIN 4.5   No results for input(s): LIPASE, AMYLASE in the last 168 hours. No results for input(s): AMMONIA in the last 168 hours. Coagulation Profile: No results for input(s): INR, PROTIME in the last 168 hours. Cardiac Enzymes:  Recent Labs Lab 06/09/16 2027  06/09/16 2355 06/10/16 0445  TROPONINI 0.03* 0.06* 0.04*   BNP (last 3 results) No results for input(s): PROBNP in the last 8760 hours. HbA1C: No results for input(s): HGBA1C in the last 72 hours. CBG:  Recent Labs Lab 06/11/16 1158 06/11/16 1718 06/11/16 2138 06/12/16 0723 06/12/16 1130  GLUCAP 234* 218* 333* 211* 222*   Lipid Profile: No results for input(s): CHOL, HDL, LDLCALC, TRIG, CHOLHDL, LDLDIRECT in the last 72 hours. Thyroid Function Tests: No results for input(s): TSH, T4TOTAL, FREET4, T3FREE, THYROIDAB in the last 72 hours. Anemia Panel: No results for input(s): VITAMINB12, FOLATE, FERRITIN, TIBC, IRON, RETICCTPCT in the last 72 hours. Sepsis Labs: No results for input(s): PROCALCITON, LATICACIDVEN in the last 168 hours.  Recent Results (from the past 240 hour(s))  Respiratory Panel by PCR     Status: Abnormal   Collection Time: 06/10/16  6:01 AM  Result Value Ref Range Status   Adenovirus NOT DETECTED NOT DETECTED Final   Coronavirus 229E NOT DETECTED NOT DETECTED Final   Coronavirus HKU1 NOT DETECTED NOT DETECTED Final   Coronavirus NL63 NOT DETECTED NOT DETECTED Final   Coronavirus OC43 NOT DETECTED NOT DETECTED Final   Metapneumovirus NOT DETECTED NOT DETECTED Final   Rhinovirus / Enterovirus NOT DETECTED NOT DETECTED Final   Influenza A NOT DETECTED NOT DETECTED Final   Influenza B NOT DETECTED NOT DETECTED Final   Parainfluenza Virus 1 NOT DETECTED NOT DETECTED Final   Parainfluenza Virus 2 NOT DETECTED NOT DETECTED Final   Parainfluenza Virus 3 NOT DETECTED NOT DETECTED Final   Parainfluenza Virus 4 NOT DETECTED NOT DETECTED Final   Respiratory Syncytial Virus DETECTED (A) NOT DETECTED Final    Comment: CRITICAL RESULT CALLED TO, READ BACK BY AND VERIFIED WITH: K. O'Berry RN 12:20 06/10/16 ([email or web address])    Bordetella pertussis NOT DETECTED NOT DETECTED Final   Chlamydophila pneumoniae NOT DETECTED NOT DETECTED Final   Mycoplasma pneumoniae NOT  DETECTED NOT DETECTED Final    Comment: Performed at [facility]         Radiology Studies: No results found.      Scheduled Meds: . aspirin EC  81 mg Oral Daily  . azithromycin  500 mg Oral q1800  . carvedilol  6.25 mg Oral BID WC  . DULoxetine  60 mg Oral Daily  . enoxaparin (LOVENOX) injection  40 mg Subcutaneous Q24H  . insulin aspart  0-15 Units Subcutaneous TID WC  . insulin aspart  0-5 Units Subcutaneous QHS  . ipratropium-albuterol  3 mL Nebulization TID  . [START ON 06/13/2016] methylPREDNISolone (SOLU-MEDROL) injection  60 mg Intravenous Q24H  . simvastatin  10 mg Oral q1800  Continuous Infusions: . sodium chloride 75 mL/hr at 06/12/16 0502     LOS: 2 days    Time spent: 30 minutes.     [name], MD [facility] Pager [phone] If 7PM-7AM, please contact night-coverage [email or web address] Password [email or web address] 06/12/2016, 4:01 PM

## 2016-06-13 LAB — HEMOGLOBIN A1C
HEMOGLOBIN A1C: 7.2 % — AB (ref 4.8–5.6)
Mean Plasma Glucose: 160 mg/dL

## 2016-06-13 LAB — LEGIONELLA PNEUMOPHILA SEROGP 1 UR AG: L. pneumophila Serogp 1 Ur Ag: NEGATIVE

## 2016-06-13 LAB — GLUCOSE, CAPILLARY
Glucose-Capillary: 143 mg/dL — ABNORMAL HIGH (ref 65–99)
Glucose-Capillary: 246 mg/dL — ABNORMAL HIGH (ref 65–99)

## 2016-06-13 MED ORDER — MOMETASONE FURO-FORMOTEROL FUM 200-5 MCG/ACT IN AERO: 2.0000 | INHALATION_SPRAY | Freq: Two times a day (BID) | RESPIRATORY_TRACT | 0 refills | Status: DC

## 2016-06-13 MED ORDER — PREDNISONE 20 MG PO TABS: ORAL_TABLET | ORAL | 0 refills | Status: DC

## 2016-06-13 MED ORDER — HYDRALAZINE HCL 20 MG/ML IJ SOLN
10.0000 mg | Freq: Once | INTRAMUSCULAR | Status: AC
  Administered 2016-06-13: 10 mg via INTRAVENOUS
  Filled 2016-06-13: qty 1

## 2016-06-13 MED ORDER — MOMETASONE FURO-FORMOTEROL FUM 200-5 MCG/ACT IN AERO
2.0000 | INHALATION_SPRAY | Freq: Two times a day (BID) | RESPIRATORY_TRACT | Status: DC
  Filled 2016-06-13: qty 8.8

## 2016-06-13 MED ORDER — IPRATROPIUM-ALBUTEROL 0.5-2.5 (3) MG/3ML IN SOLN: 3.0000 mL | RESPIRATORY_TRACT | 2 refills | Status: DC | PRN

## 2016-06-13 MED ORDER — GUAIFENESIN-DM 100-10 MG/5ML PO SYRP: 5.0000 mL | ORAL_SOLUTION | ORAL | 0 refills | Status: DC | PRN

## 2016-06-13 MED ORDER — AZITHROMYCIN 250 MG PO TABS: 500.0000 mg | ORAL_TABLET | Freq: Every day | ORAL | 0 refills | Status: DC

## 2016-06-13 NOTE — Care Management Note (Signed)
Case Management Note  Patient Details  Name: Sara Holt MRN: [id number] Date of Birth: 05-Apr-1935  Subjective/Objective:  Ordered for home neb machine-[facility] dme rep Larene Beach notified of d/c & order, to deliver to rm prior d/c.                  Action/Plan:d/c home w/home neb machine.   Expected Discharge Date:                  Expected Discharge Plan:  Home/Self Care  In-House Referral:     Discharge planning Services  CM Consult  Post Acute Care Choice:    Choice offered to:     DME Arranged:  Nebulizer machine DME Agency:     HH Arranged:    HH Agency:     Status of Service:  Completed, signed off  If discussed at [employer] of Stay Meetings, dates discussed:    Additional Comments:  Dessa Phi, RN 06/13/2016, 12:51 PM

## 2016-06-13 NOTE — Care Management Note (Signed)
Case Management Note  Patient Details  Name: LEELYNN TWIDWELL MRN: [id number] Date of Birth: Jul 25, 1934  Subjective/Objective: Patient will pick up neb machine @ [facility] dme retail office-[address] [id number] is aware of location, & DME rep Shannon-will put request into system so its @ store when patient arrives-Nsg notified. No further CM needs.                   Action/Plan:d/c home w/home neb machine @ retail store for p/u.   Expected Discharge Date:                  Expected Discharge Plan:  Home/Self Care  In-House Referral:     Discharge planning Services  CM Consult  Post Acute Care Choice:    Choice offered to:     DME Arranged:  Nebulizer machine DME Agency:     HH Arranged:    HH Agency:     Status of Service:  Completed, signed off  If discussed at [employer] of Stay Meetings, dates discussed:    Additional Comments:  Dessa Phi, RN 06/13/2016, 12:57 PM

## 2016-06-13 NOTE — Care Management Note (Signed)
Case Management Note  Patient Details  Name: Sara Holt MRN: [id number] Date of Birth: [birth year]  Subjective/Objective: 81 y/o f admitted w/Reactive airway dz. From home. PT-no f/u.                   Action/Plan:d/c home.   Expected Discharge Date:                  Expected Discharge Plan:  Home/Self Care  In-House Referral:     Discharge planning Services  CM Consult  Post Acute Care Choice:    Choice offered to:     DME Arranged:    DME Agency:     HH Arranged:    [place] Agency:     Status of Service:  Completed, signed off  If discussed at [employer] of Stay Meetings, dates discussed:    Additional Comments:  Dessa Phi, RN 06/13/2016, 12:48 PM

## 2016-06-14 ENCOUNTER — Telehealth: Payer: Self-pay

## 2016-06-14 NOTE — Telephone Encounter (Signed)
Pt scheduled with JN next available. Nothing further needed.

## 2016-06-17 NOTE — Discharge Summary (Addendum)
Physician Discharge Summary  Sara Holt [id number] DOB: 1934-12-05 DOA: 06/09/2016  PCP: No primary care provider on file.  Admit date: 06/09/2016 Discharge date: 06/13/2016  Admitted From: Home.  Disposition: Home.   Recommendations for Outpatient Follow-up:  1. Follow up with PCP in 1-2 weeks 2. Please obtain BMP/CBC in one week 3. Please follow up with pulmonology in 1 to 2 weeks.     Discharge Condition:stable.  CODE STATUS:full code.  Diet recommendation: Heart Healthy  Brief/Interim Summary: Sara Holt a 81 y.o.femalewith medical history significant of HTN, HLD, DM type II, and self reported asthma; who presents with 3 week history of cough, was admitted for asthma exacerbation, started on IV steroids.  Discharge Diagnoses:  Principal Problem:   Reactive airway disease with wheezing with acute exacerbation Active Problems:   HLD (hyperlipidemia)   Diabetes, polyneuropathy (HCC)   Acute kidney injury superimposed on chronic kidney disease (HCC)   Elevated troponin    Asthma Exacerbation:  Improved wheezing, cough is better today. Transitioned to po steroids on discharge.  Resume  bronchodilators and cough medication. Not requiring oxygen.  Respiratory panel is RSV positive.    Diabetes Mellitus: CBG (last 3)   Recent Labs (last 2 labs)    Recent Labs  06/11/16 2138 06/12/16 0723 06/12/16 1130  GLUCAP 333* 211* 222*       Resume moderate SSI.  Discharge on home meds.    Hypertension:   sub optimal Controlled.    Mild elevation of troponins: possibly from elevated creatinine.  No chest pain.  EKG Sinus rhythm Ventricular premature complex Probable left atrial enlargement Right bundle branch block No significant change since last tracing  Acute renal failure on stage 2 CKD:  Last creatinine is around 1.1.  Creatinine on admission is 1.4, suspect from dehydration. She was started on IV fluids.  Creatinine  normal today   Discharge Instructions  Discharge Instructions    Diet - low sodium heart healthy    Complete by:  As directed    Discharge instructions    Complete by:  As directed    Please follow up with PCP in one week.  Please follow up with pulmonologist in one week     Allergies as of 06/13/2016      Reactions   Buprenorphine Hcl Nausea And Vomiting   nausea   Morphine And Related Nausea And Vomiting   nausea   Sulfa Antibiotics Nausea And Vomiting   "deathly ill"      Medication List    STOP taking these medications   amoxicillin-clavulanate 875-125 MG tablet Commonly known as:  AUGMENTIN     TAKE these medications   alendronate 70 MG tablet Commonly known as:  FOSAMAX Take 70 mg by mouth every 7 (seven) days. Take with a full glass of water on an empty stomach.   aspirin 81 MG tablet Take 81 mg by mouth daily.   azithromycin 250 MG tablet Commonly known as:  ZITHROMAX Take 2 tablets (500 mg total) by mouth daily.   carvedilol 6.25 MG tablet Commonly known as:  COREG Take 6.25 mg by mouth Twice daily.   chlorpheniramine-HYDROcodone 10-8 MG/5ML Suer Commonly known as:  TUSSIONEX Take 5 mLs by mouth at bedtime as needed for cough.   cholecalciferol 1000 units tablet Commonly known as:  VITAMIN D Take 1,000 Units by mouth daily.   cloNIDine 0.1 MG tablet Commonly known as:  CATAPRES Take 0.1 mg by mouth as needed (if b/p >150).  CYMBALTA 60 MG capsule Generic drug:  DULoxetine Take 1 tablet by mouth Daily.   desoximetasone 0.25 % cream Commonly known as:  TOPICORT Apply topically continuous as needed (skin allergy).   guaiFENesin-dextromethorphan 100-10 MG/5ML syrup Commonly known as:  ROBITUSSIN DM Take 5 mLs by mouth every 4 (four) hours as needed for cough.   hydrochlorothiazide 25 MG tablet Commonly known as:  HYDRODIURIL Take 12.5 mg by mouth Daily.   INVOKANA 100 MG Tabs tablet Generic drug:  canagliflozin Take 100 mg by mouth  daily before breakfast.   ipratropium-albuterol 0.5-2.5 (3) MG/3ML Soln Commonly known as:  DUONEB Take 3 mLs by nebulization every 4 (four) hours as needed.   JANUMET XR [phone] MG Tb24 Generic drug:  SitaGLIPtin-MetFORMIN HCl Take 1 tablet by mouth daily.   mometasone-formoterol 200-5 MCG/ACT Aero Commonly known as:  DULERA Inhale 2 puffs into the lungs 2 (two) times daily.   ONE TOUCH ULTRA TEST test strip Generic drug:  glucose blood   OVER THE COUNTER MEDICATION Place 1 drop into both eyes at bedtime. dry eyes med   predniSONE 20 MG tablet Commonly known as:  DELTASONE Prednisone 60 mg daily for 3 days followed by  Prednisone 40 mg daily for 3 days followed by  Prednisone 20 mg daily for 3 days and stop.   ramipril 10 MG capsule Commonly known as:  ALTACE Take 10 mg by mouth daily.   simvastatin 10 MG tablet Commonly known as:  ZOCOR Take 10 mg by mouth Daily.   traMADol 50 MG tablet Commonly known as:  ULTRAM Take 50 mg by mouth every 6 (six) hours as needed for moderate pain.   triamcinolone cream 0.1 % Commonly known as:  KENALOG Apply 1 application topically as needed (skin allergy).   VOLTAREN 1 % Gel Generic drug:  diclofenac sodium Apply 4 g topically as needed (pain).      Follow-up Information    [facility]. Schedule an appointment as soon as possible for a visit in 1 week(s).   Specialty:  Pulmonology Why:  for new onset asthma , with exacerbation.  Contact information: [place] Follow up.   Why:  home neb machine-pick up @ retail store [address] [id number] Contact information: [place] [address] [phone]          Allergies  Allergen Reactions  . Buprenorphine Hcl Nausea And Vomiting    nausea  . Morphine And Related Nausea And Vomiting    nausea  . Sulfa Antibiotics Nausea And Vomiting     "deathly ill"    Consultations:  None.    Procedures/Studies: Dg Chest 2 View  Result Date: 06/09/2016 CLINICAL DATA:  Cough and congestion for 3 weeks. EXAM: CHEST  2 VIEW COMPARISON:  08/07/2015 FINDINGS: The cardiomediastinal contours are normal. There is atherosclerosis of thoracic aorta. Mild left basilar atelectasis. Pulmonary vasculature is normal. No consolidation, pleural effusion, or pneumothorax. No acute osseous abnormalities are seen. Degenerative change in the lower thoracic spine. IMPRESSION: 1. Mild left basilar atelectasis. 2. Aortic atherosclerosis. Electronically Signed   By: Jeb Levering M.D.   On: 06/09/2016 21:16       Subjective:  No new complaints.  Discharge Exam: Vitals:   06/12/16 2113 06/13/16 0637  BP: (!) 165/96 (!) 183/98  Pulse: 65 67  Resp: 19 18  Temp: 98.4 F (  36.9 C) 98.2 F (36.8 C)   Vitals:   06/12/16 2029 06/12/16 2113 06/13/16 0209 06/13/16 0637  BP:  (!) 165/96  (!) 183/98  Pulse:  65  67  Resp:  19  18  Temp:  98.4 F (36.9 C)  98.2 F (36.8 C)  TempSrc:  Oral  Oral  SpO2: 96% 91% 92% 93%  Weight:      Height:        General: Pt is alert, awake, not in acute distress Cardiovascular: RRR, S1/S2 +, no rubs, no gallops Respiratory: CTA bilaterally, no wheezing, no rhonchi Abdominal: Soft, NT, ND, bowel sounds + Extremities: no edema, no cyanosis    The results of significant diagnostics from this hospitalization (including imaging, microbiology, ancillary and laboratory) are listed below for reference.     Microbiology: Recent Results (from the past 240 hour(s))  Respiratory Panel by PCR     Status: Abnormal   Collection Time: 06/10/16  6:01 AM  Result Value Ref Range Status   Adenovirus NOT DETECTED NOT DETECTED Final   Coronavirus 229E NOT DETECTED NOT DETECTED Final   Coronavirus HKU1 NOT DETECTED NOT DETECTED Final   Coronavirus NL63 NOT DETECTED NOT DETECTED Final   Coronavirus OC43 NOT DETECTED NOT  DETECTED Final   Metapneumovirus NOT DETECTED NOT DETECTED Final   Rhinovirus / Enterovirus NOT DETECTED NOT DETECTED Final   Influenza A NOT DETECTED NOT DETECTED Final   Influenza B NOT DETECTED NOT DETECTED Final   Parainfluenza Virus 1 NOT DETECTED NOT DETECTED Final   Parainfluenza Virus 2 NOT DETECTED NOT DETECTED Final   Parainfluenza Virus 3 NOT DETECTED NOT DETECTED Final   Parainfluenza Virus 4 NOT DETECTED NOT DETECTED Final   Respiratory Syncytial Virus DETECTED (A) NOT DETECTED Final    Comment: CRITICAL RESULT CALLED TO, READ BACK BY AND VERIFIED WITH: K. O'Berry RN 12:20 06/10/16 ([email or web address])    Bordetella pertussis NOT DETECTED NOT DETECTED Final   Chlamydophila pneumoniae NOT DETECTED NOT DETECTED Final   Mycoplasma pneumoniae NOT DETECTED NOT DETECTED Final    Comment: Performed at [place]: BNP (last 3 results)  Recent Labs  06/09/16 2027  BNP [id number]   Basic Metabolic Panel:  Recent Labs Lab 06/12/16 0522  NA 138  K 3.7  CL 103  CO2 27  GLUCOSE 232*  BUN 22*  CREATININE 0.78  CALCIUM 8.9   Liver Function Tests: No results for input(s): AST, ALT, ALKPHOS, BILITOT, PROT, ALBUMIN in the last 168 hours. No results for input(s): LIPASE, AMYLASE in the last 168 hours. No results for input(s): AMMONIA in the last 168 hours. CBC:  Recent Labs Lab 06/12/16 0522  WBC 11.6*  HGB 12.2  HCT 35.4*  MCV 85.1  PLT 207   Cardiac Enzymes: No results for input(s): CKTOTAL, CKMB, CKMBINDEX, TROPONINI in the last 168 hours. BNP: Invalid input(s): POCBNP CBG:  Recent Labs Lab 06/12/16 1130 06/12/16 1641 06/12/16 2108 06/13/16 0753 06/13/16 1149  GLUCAP 222* 330* 232* 143* 246*   D-Dimer No results for input(s): DDIMER in the last 72 hours. Hgb A1c No results for input(s): HGBA1C in the last 72 hours. Lipid Profile No results for input(s): CHOL, HDL, LDLCALC, TRIG, CHOLHDL, LDLDIRECT in the last 72 hours. Thyroid function  studies No results for input(s): TSH, T4TOTAL, T3FREE, THYROIDAB in the last 72 hours.  Invalid input(s): FREET3 Anemia work up No results for input(s): VITAMINB12, FOLATE, FERRITIN, TIBC, IRON, RETICCTPCT in the last 12  hours. Urinalysis    Component Value Date/Time   COLORURINE YELLOW 07/15/2010 1523   APPEARANCEUR CLEAR 07/15/2010 1523   LABSPEC 1.005 07/15/2010 1523   PHURINE 7.5 07/15/2010 1523   HGBUR NEGATIVE 07/15/2010 1523   BILIRUBINUR NEGATIVE 07/15/2010 1523   KETONESUR NEGATIVE 07/15/2010 1523   PROTEINUR NEGATIVE 07/15/2010 1523   UROBILINOGEN 0.2 07/15/2010 1523   NITRITE NEGATIVE 07/15/2010 1523   LEUKOCYTESUR  07/15/2010 1523    NEGATIVE MICROSCOPIC NOT DONE ON URINES WITH NEGATIVE PROTEIN, BLOOD, LEUKOCYTES, NITRITE, OR GLUCOSE <1000 mg/dL.   Sepsis Labs Invalid input(s): PROCALCITONIN,  WBC,  LACTICIDVEN Microbiology Recent Results (from the past 240 hour(s))  Respiratory Panel by PCR     Status: Abnormal   Collection Time: 06/10/16  6:01 AM  Result Value Ref Range Status   Adenovirus NOT DETECTED NOT DETECTED Final   Coronavirus 229E NOT DETECTED NOT DETECTED Final   Coronavirus HKU1 NOT DETECTED NOT DETECTED Final   Coronavirus NL63 NOT DETECTED NOT DETECTED Final   Coronavirus OC43 NOT DETECTED NOT DETECTED Final   Metapneumovirus NOT DETECTED NOT DETECTED Final   Rhinovirus / Enterovirus NOT DETECTED NOT DETECTED Final   Influenza A NOT DETECTED NOT DETECTED Final   Influenza B NOT DETECTED NOT DETECTED Final   Parainfluenza Virus 1 NOT DETECTED NOT DETECTED Final   Parainfluenza Virus 2 NOT DETECTED NOT DETECTED Final   Parainfluenza Virus 3 NOT DETECTED NOT DETECTED Final   Parainfluenza Virus 4 NOT DETECTED NOT DETECTED Final   Respiratory Syncytial Virus DETECTED (A) NOT DETECTED Final    Comment: CRITICAL RESULT CALLED TO, READ BACK BY AND VERIFIED WITH: K. O'Berry RN 12:20 06/10/16 ([email or web address])    Bordetella pertussis NOT DETECTED NOT DETECTED  Final   Chlamydophila pneumoniae NOT DETECTED NOT DETECTED Final   Mycoplasma pneumoniae NOT DETECTED NOT DETECTED Final    Comment: Performed at [facility]     Time coordinating discharge: Over 30 minutes  SIGNED:   [name], MD  [facility] 06/17/2016, 8:47 AM Pager   If 7PM-7AM, please contact night-coverage [email or web address] Password [email or web address]

## 2016-06-23 ENCOUNTER — Institutional Professional Consult (permissible substitution): Payer: Medicare Other | Admitting: Pulmonary Disease

## 2016-07-08 DIAGNOSIS — I10 Essential (primary) hypertension: Secondary | ICD-10-CM | POA: Insufficient documentation

## 2016-10-22 ENCOUNTER — Encounter (HOSPITAL_BASED_OUTPATIENT_CLINIC_OR_DEPARTMENT_OTHER): Payer: Self-pay | Admitting: *Deleted

## 2016-10-22 ENCOUNTER — Emergency Department (HOSPITAL_BASED_OUTPATIENT_CLINIC_OR_DEPARTMENT_OTHER)
Admission: EM | Admit: 2016-10-22 | Discharge: 2016-10-22 | Disposition: A | Discharge status: Home / Self Care | Payer: Medicare Other | Attending: Emergency Medicine | Admitting: Emergency Medicine

## 2016-10-22 DIAGNOSIS — Z7982 Long term (current) use of aspirin: Secondary | ICD-10-CM | POA: Insufficient documentation

## 2016-10-22 DIAGNOSIS — Z79899 Other long term (current) drug therapy: Secondary | ICD-10-CM | POA: Insufficient documentation

## 2016-10-22 DIAGNOSIS — I129 Hypertensive chronic kidney disease with stage 1 through stage 4 chronic kidney disease, or unspecified chronic kidney disease: Secondary | ICD-10-CM | POA: Insufficient documentation

## 2016-10-22 DIAGNOSIS — N189 Chronic kidney disease, unspecified: Secondary | ICD-10-CM | POA: Insufficient documentation

## 2016-10-22 DIAGNOSIS — J45909 Unspecified asthma, uncomplicated: Secondary | ICD-10-CM | POA: Insufficient documentation

## 2016-10-22 DIAGNOSIS — L03011 Cellulitis of right finger: Secondary | ICD-10-CM | POA: Diagnosis not present

## 2016-10-22 DIAGNOSIS — M79644 Pain in right finger(s): Secondary | ICD-10-CM | POA: Diagnosis present

## 2016-10-22 DIAGNOSIS — E1122 Type 2 diabetes mellitus with diabetic chronic kidney disease: Secondary | ICD-10-CM | POA: Diagnosis not present

## 2016-10-22 MED ORDER — CEPHALEXIN 500 MG PO CAPS: 500.0000 mg | ORAL_CAPSULE | Freq: Three times a day (TID) | ORAL | 0 refills | Status: DC

## 2016-10-22 MED ORDER — DOXYCYCLINE HYCLATE 100 MG PO CAPS: 100.0000 mg | ORAL_CAPSULE | Freq: Two times a day (BID) | ORAL | 0 refills | Status: DC

## 2016-10-22 NOTE — ED Provider Notes (Signed)
[facility] [facility] DEPT [facility] Provider Note   CSN: [id number] Arrival date & time: 10/22/16  1545  By signing my name below, I, Hansel Feinstein, attest that this documentation has been prepared under the direction and in the presence of Domenic Moras, PA-C. Electronically Signed: Hansel Feinstein, ED Scribe. 10/22/16. 4:12 PM.    History   Chief Complaint Chief Complaint  Patient presents with  . Hand Pain    HPI Sara Holt is a 82 y.o. female with h/o osteoarthritis, diabetes who presents to the Emergency Department complaining of gradually worsening, intermittent right middle finger pain swelling that began yesterday morning. Pt states she woke up with her symptoms. She denies recent injury, trauma or falls. She states her pain is throbbing and worsened with touch and unrelieved with Neosporin. She denies fever. Pain is currently minimal.  No hx of gout, no numbness.   The history is provided by the patient. No language interpreter was used.    Past Medical History:  Diagnosis Date  . Allergy   . Anemia   . Arthritis    OA  . Asthma   . Carotid artery occlusion   . Diabetes mellitus    DIET CONTROLLED, NO MEDS  . Disc    problems in neck and back  . GERD (gastroesophageal reflux disease)   . Hyperlipidemia   . Hypertension   . Neuromuscular disorder ([place])    nerve problems with hands  . Osteoporosis   . Sleep apnea     Patient Active Problem List   Diagnosis Date Noted  . Reactive airway disease 06/10/2016  . Reactive airway disease with wheezing with acute exacerbation 06/10/2016  . Acute kidney injury superimposed on chronic kidney disease ([place]) 06/10/2016  . Elevated troponin 06/10/2016  . Anemia of renal disease 08/17/2015  . Anemia, iron deficiency 01/22/2015  . Gait disorder 12/11/2014  . Diabetes, polyneuropathy ([place]) 12/11/2014  . [facility] arthritis, thumb, degenerative 12/03/2014  . Near syncope 08/21/2014  . Numbness and tingling in right hand 03/04/2014  .  [facility] Leg 03/04/2014  . Pain of right lower leg 03/04/2014  . Aftercare following surgery of the circulatory system, [address] 03/04/2014  . Brachial neuritis 12/08/2012  . Carpal tunnel syndrome 12/08/2012  . HLD (hyperlipidemia) 12/08/2012  . Arthritis, degenerative 12/08/2012  . Cervical spinal stenosis 12/08/2012  . Personal history of colonic polyps 09/12/2012  . Hemorrhage of rectum and anus 09/12/2012  . Encounter for postoperative carotid endarterectomy surveillance 08/26/2011    Past Surgical History:  Procedure Laterality Date  . CAROTID ARTERY ANGIOPLASTY    . CAROTID ENDARTERECTOMY  Jan. 16,2012   LEFT cea  . carpel tunnel     both hands  . COLONOSCOPY    . JOINT REPLACEMENT Left 2011   Knee  . KNEE ARTHROSCOPY Left 2010  . POLYPECTOMY      OB History    No data available       Home Medications    Prior to Admission medications   Medication Sig Start Date End Date Taking? Authorizing Provider  alendronate (FOSAMAX) 70 MG tablet Take 70 mg by mouth every 7 (seven) days. Take with a full glass of water on an empty stomach.   Yes Historical Provider, MD  aspirin 81 MG tablet Take 81 mg by mouth daily.   Yes Historical Provider, MD  canagliflozin (INVOKANA) 100 MG TABS tablet Take 100 mg by mouth daily before breakfast.   Yes Historical Provider, MD  carvedilol (COREG) 6.25 MG tablet Take  6.25 mg by mouth Twice daily.  08/09/11  Yes Historical Provider, MD  cholecalciferol (VITAMIN D) 1000 units tablet Take 1,000 Units by mouth daily.   Yes Historical Provider, MD  CYMBALTA 60 MG capsule Take 1 tablet by mouth Daily.  08/22/11  Yes Historical Provider, MD  desoximetasone (TOPICORT) 0.25 % cream Apply topically continuous as needed (skin allergy).  01/13/12  Yes Historical Provider, MD  diclofenac sodium (VOLTAREN) 1 % GEL Apply 4 g topically as needed (pain).    Yes Historical Provider, MD  hydrochlorothiazide (HYDRODIURIL) 25 MG tablet Take 12.5 mg by mouth Daily.   08/03/11  Yes Historical Provider, MD  ipratropium-albuterol (DUONEB) 0.5-2.5 (3) MG/3ML SOLN Take 3 mLs by nebulization every 4 (four) hours as needed. 06/13/16  Yes [name], MD  mometasone-formoterol (DULERA) 200-5 MCG/ACT AERO Inhale 2 puffs into the lungs 2 (two) times daily. 06/13/16  Yes [name], MD  ONE TOUCH ULTRA TEST test strip  06/18/14  Yes Historical Provider, MD  OVER THE COUNTER MEDICATION Place 1 drop into both eyes at bedtime. dry eyes med   Yes Historical Provider, MD  ramipril (ALTACE) 10 MG capsule Take 10 mg by mouth daily.  07/22/11  Yes Historical Provider, MD  simvastatin (ZOCOR) 10 MG tablet Take 10 mg by mouth Daily.  06/06/11  Yes Historical Provider, MD  SitaGLIPtin-MetFORMIN HCl (JANUMET XR) [phone] MG TB24 Take 1 tablet by mouth daily.   Yes Historical Provider, MD  traMADol (ULTRAM) 50 MG tablet Take 50 mg by mouth every 6 (six) hours as needed for moderate pain.  09/22/15  Yes Historical Provider, MD  triamcinolone cream (KENALOG) 0.1 % Apply 1 application topically as needed (skin allergy).  04/28/16  Yes Historical Provider, MD  azithromycin (ZITHROMAX) 250 MG tablet Take 2 tablets (500 mg total) by mouth daily. 06/14/16   [name], MD  chlorpheniramine-HYDROcodone (TUSSIONEX) 10-8 MG/5ML SUER Take 5 mLs by mouth at bedtime as needed for cough.     Historical Provider, MD  cloNIDine (CATAPRES) 0.1 MG tablet Take 0.1 mg by mouth as needed (if b/p >150).    Historical Provider, MD  guaiFENesin-dextromethorphan (ROBITUSSIN DM) 100-10 MG/5ML syrup Take 5 mLs by mouth every 4 (four) hours as needed for cough. 06/13/16   [name], MD  predniSONE (DELTASONE) 20 MG tablet Prednisone 60 mg daily for 3 days followed by  Prednisone 40 mg daily for 3 days followed by  Prednisone 20 mg daily for 3 days and stop. 06/13/16   [name], MD    Family History Family History  Problem Relation Age of Onset  . Diabetes Father   . Pneumonia Father   . Peripheral  vascular disease Sister   . Colon cancer Maternal Grandmother   . Cancer Maternal Grandmother     Colon cancer  . Esophageal cancer Neg Hx   . Stomach cancer Neg Hx   . Rectal cancer Neg Hx     Social History Social History  Substance Use Topics  . Smoking status: Never Smoker  . Smokeless tobacco: Never Used  . Alcohol use 4.2 oz/week    7 Glasses of wine per week     Allergies   Buprenorphine hcl; Morphine and related; and Sulfa antibiotics   Review of Systems Review of Systems  Constitutional: Negative for fever.  Musculoskeletal: Positive for arthralgias and joint swelling.     Physical Exam Updated Vital Signs BP (!) 134/54 (BP Location: Left Arm)   Pulse 64   Temp  98.1 F (36.7 C) (Oral)   Resp 18   SpO2 95%   Physical Exam  Constitutional: She appears well-developed and well-nourished.  HENT:  Head: Normocephalic.  Eyes: Conjunctivae are normal.  Cardiovascular: Normal rate.   Pulmonary/Chest: Effort normal. No respiratory distress.  Abdominal: She exhibits no distension.  Musculoskeletal: Normal range of motion. She exhibits edema and tenderness.  Right hand: middle finger mild erythema and edema noted to the DIP joint with TTP. Normal flexion and extension. No nail involvement. Good cap refill.   Neurological: She is alert.  Skin: Skin is warm and dry.  Psychiatric: She has a normal mood and affect. Her behavior is normal.  Nursing note and vitals reviewed.    ED Treatments / Results   DIAGNOSTIC STUDIES: Oxygen Saturation is 95% on RA, adequate by my interpretation.    COORDINATION OF CARE: 4:10 PM Discussed treatment plan with pt at bedside which includes antibiotics and pt agreed to plan.   Procedures Procedures (including critical care time)  Medications Ordered in ED Medications - No data to display   Initial Impression / Assessment and Plan / ED Course  I have reviewed the triage vital signs and the nursing notes.      [name] presents to the ED for evaluation of right middle finger pain and swelling. Suspect cellulitis and less likely septic joint.  Doubt gout as pt does not have any prior hx of such.  Pain is minimal. Patient will be sent home with Doxy. Conservative therapies discussed and recommended. Patient advised to follow up with PCP as needed, or with worsening symptoms. Patient appears stable for discharge at this time. Return precautions discussed and outlined in discharge paperwork. Patient is agreeable to plan. Care discussed with Dr. Rex Kras.     Final Clinical Impressions(s) / ED Diagnoses   Final diagnoses:  Cellulitis of middle finger, right    New Prescriptions New Prescriptions   CEPHALEXIN (KEFLEX) 500 MG CAPSULE    Take 1 capsule (500 mg total) by mouth 3 (three) times daily.   I personally performed the services described in this documentation, which was scribed in my presence. The recorded information has been reviewed and is accurate.      Domenic Moras, PA-C 10/22/16 [place], PA-C 10/22/16 [place], MD 10/25/16 [phone]

## 2016-10-22 NOTE — ED Triage Notes (Signed)
Pt reports R middle finger swelling since yesterday. Denies fever, decreased sensation.

## 2016-10-22 NOTE — ED Notes (Signed)
Pt discharged to home NAD.  

## 2016-10-22 NOTE — Discharge Instructions (Signed)
Please take antibiotic as prescribed for the full duration.  Soak finger in warm water with epsom salt several times daily.  If you notice no improvement in 48 hrs, please follow up with your doctor or return for further care.

## 2016-11-14 ENCOUNTER — Ambulatory Visit: Payer: Medicare Other | Admitting: Hematology & Oncology

## 2016-11-14 ENCOUNTER — Other Ambulatory Visit: Payer: Medicare Other

## 2016-12-07 ENCOUNTER — Ambulatory Visit: Payer: Medicare Other

## 2016-12-12 ENCOUNTER — Ambulatory Visit: Payer: Medicare Other | Admitting: Hematology & Oncology

## 2016-12-12 ENCOUNTER — Other Ambulatory Visit: Payer: Medicare Other

## 2016-12-16 ENCOUNTER — Ambulatory Visit (HOSPITAL_BASED_OUTPATIENT_CLINIC_OR_DEPARTMENT_OTHER): Payer: Medicare Other | Admitting: Hematology & Oncology

## 2016-12-16 ENCOUNTER — Other Ambulatory Visit (HOSPITAL_BASED_OUTPATIENT_CLINIC_OR_DEPARTMENT_OTHER): Payer: Medicare Other

## 2016-12-16 VITALS — BP 149/72 | HR 62 | Temp 97.8°F | Resp 18 | Wt 161.0 lb

## 2016-12-16 DIAGNOSIS — D509 Iron deficiency anemia, unspecified: Secondary | ICD-10-CM

## 2016-12-16 DIAGNOSIS — D500 Iron deficiency anemia secondary to blood loss (chronic): Secondary | ICD-10-CM | POA: Diagnosis not present

## 2016-12-16 DIAGNOSIS — N189 Chronic kidney disease, unspecified: Secondary | ICD-10-CM

## 2016-12-16 DIAGNOSIS — D631 Anemia in chronic kidney disease: Secondary | ICD-10-CM

## 2016-12-16 LAB — CBC WITH DIFFERENTIAL (CANCER CENTER ONLY)
BASO#: 0 10*3/uL (ref 0.0–0.2)
BASO%: 0.5 % (ref 0.0–2.0)
EOS ABS: 0.4 10*3/uL (ref 0.0–0.5)
EOS%: 5.4 % (ref 0.0–7.0)
HEMATOCRIT: 42.7 % (ref 34.8–46.6)
HEMOGLOBIN: 14.4 g/dL (ref 11.6–15.9)
LYMPH#: 2.8 10*3/uL (ref 0.9–3.3)
LYMPH%: 34.8 % (ref 14.0–48.0)
MCH: 29.6 pg (ref 26.0–34.0)
MCHC: 33.7 g/dL (ref 32.0–36.0)
MCV: 88 fL (ref 81–101)
MONO#: 0.5 10*3/uL (ref 0.1–0.9)
MONO%: 6.1 % (ref 0.0–13.0)
NEUT%: 53.2 % (ref 39.6–80.0)
NEUTROS ABS: 4.2 10*3/uL (ref 1.5–6.5)
Platelets: 214 10*3/uL (ref 145–400)
RBC: 4.87 10*6/uL (ref 3.70–5.32)
RDW: 13.6 % (ref 11.1–15.7)
WBC: 7.9 10*3/uL (ref 3.9–10.0)

## 2016-12-16 LAB — IRON AND TIBC
%SAT: 26 % (ref 21–57)
Iron: 87 ug/dL (ref 41–142)
TIBC: 328 ug/dL (ref 236–444)
UIBC: 241 ug/dL (ref 120–384)

## 2016-12-16 LAB — FERRITIN: Ferritin: 37 ng/ml (ref 9–269)

## 2016-12-16 LAB — RETICULOCYTES: Reticulocyte Count: 1.2 % (ref 0.6–2.6)

## 2016-12-16 NOTE — Progress Notes (Signed)
Hematology and Oncology Follow Up Visit  Sara Holt [id number] [birth year] 82 y.o. 12/16/2016   Principle Diagnosis:  1. Iron deficiency anemia 2. Anemia of renal insufficiency  Current Therapy:   IV iron as indicated - last dose given in December 2016    Interim History:  Sara Holt is here today for a follow-up. For some reason, her appointment earlier this week got canceled. I'm not sure why. I apologized to her for this. I was absolutely appalled that this would happen. I made sure that I told our staff that if the patient comes in and somehow her appointment is canceled, I will still see him or her. This way, this will avoid any aggravation that the patient will experience.  She is changing her diabetic medications. What she is on his way too expensive for her. She's not sure what she is taking right now.  She says that her last hemoglobin A1c was 6.2.  It probably has been a year and a half since she had iron. When we last saw her in November, her ferritin was 42 with iron saturation of 23%..   She has had no bleeding. There's been no change in bowel or bladder habits. She's had no nausea or vomiting. She's had no rashes. She's had no leg swelling.   She has a performance status of ECOG 1..  Medications:  Allergies as of 12/16/2016      Reactions   Buprenorphine Hcl Nausea And Vomiting   nausea   Morphine And Related Nausea And Vomiting   nausea   Sulfa Antibiotics Nausea And Vomiting   "deathly ill"      Medication List       Accurate as of 12/16/16  9:18 AM. Always use your most recent med list.          alendronate 70 MG tablet Commonly known as:  FOSAMAX Take 70 mg by mouth every 7 (seven) days. Take with a full glass of water on an empty stomach.   aspirin 81 MG tablet Take 81 mg by mouth daily.   carvedilol 6.25 MG tablet Commonly known as:  COREG Take 6.25 mg by mouth Twice daily.   chlorpheniramine-HYDROcodone 10-8 MG/5ML Suer Commonly  known as:  TUSSIONEX Take 5 mLs by mouth at bedtime as needed for cough.   cholecalciferol 1000 units tablet Commonly known as:  VITAMIN D Take 1,000 Units by mouth daily.   cloNIDine 0.1 MG tablet Commonly known as:  CATAPRES Take 0.1 mg by mouth as needed (if b/p >150).   CYMBALTA 60 MG capsule Generic drug:  DULoxetine Take 1 tablet by mouth Daily.   desoximetasone 0.25 % cream Commonly known as:  TOPICORT Apply topically continuous as needed (skin allergy).   guaiFENesin-dextromethorphan 100-10 MG/5ML syrup Commonly known as:  ROBITUSSIN DM Take 5 mLs by mouth every 4 (four) hours as needed for cough.   hydrochlorothiazide 25 MG tablet Commonly known as:  HYDRODIURIL Take 12.5 mg by mouth Daily.   INVOKANA 100 MG Tabs tablet Generic drug:  canagliflozin Take 100 mg by mouth daily before breakfast.   ipratropium-albuterol 0.5-2.5 (3) MG/3ML Soln Commonly known as:  DUONEB Take 3 mLs by nebulization every 4 (four) hours as needed.   JANUMET XR [phone] MG Tb24 Generic drug:  SitaGLIPtin-MetFORMIN HCl Take 1 tablet by mouth daily.   mometasone-formoterol 200-5 MCG/ACT Aero Commonly known as:  DULERA Inhale 2 puffs into the lungs 2 (two) times daily.   ONE TOUCH ULTRA TEST test strip  Generic drug:  glucose blood   OVER THE COUNTER MEDICATION Place 1 drop into both eyes at bedtime. dry eyes med   ramipril 10 MG capsule Commonly known as:  ALTACE Take 10 mg by mouth daily.   simvastatin 10 MG tablet Commonly known as:  ZOCOR Take 10 mg by mouth Daily.   traMADol 50 MG tablet Commonly known as:  ULTRAM Take 50 mg by mouth every 6 (six) hours as needed for moderate pain.   triamcinolone cream 0.1 % Commonly known as:  KENALOG Apply 1 application topically as needed (skin allergy).   VOLTAREN 1 % Gel Generic drug:  diclofenac sodium Apply 4 g topically as needed (pain).       Allergies:  Allergies  Allergen Reactions  . Buprenorphine Hcl Nausea  And Vomiting    nausea  . Morphine And Related Nausea And Vomiting    nausea  . Sulfa Antibiotics Nausea And Vomiting    "deathly ill"    Past Medical History, Surgical history, Social history, and Family History were reviewed and updated.  Review of Systems: All other 10 point review of systems is negative.   Physical Exam:  weight is 161 lb (73 kg). Her oral temperature is 97.8 F (36.6 C). Her blood pressure is 149/72 (abnormal) and her pulse is 62. Her respiration is 18 and oxygen saturation is 97%.   Wt Readings from Last 3 Encounters:  12/16/16 161 lb (73 kg)  06/10/16 163 lb 2.3 oz (74 kg)  05/20/16 160 lb 12.8 oz (72.9 kg)    Well-developed well-nourished white female in no obvious distress. Head and neck exam shows no ocular or oral lesions. There are no palpable cervical or supraclavicular lymph nodes. Lungs are clear. Cardiac exam regular rate and rhythm with no murmurs, rubs or bruits. Abdomen is soft. She has good bowel sounds. There is no fluid wave. There is no palpable liver or spleen tip. Back exam shows no tenderness over the spine, ribs or hips. Extremities shows no clubbing, cyanosis or edema. Skin exam shows no rashes, ecchymoses or petechia.  Lab Results  Component Value Date   WBC 7.9 12/16/2016   HGB 14.4 12/16/2016   HCT 42.7 12/16/2016   MCV 88 12/16/2016   PLT 214 12/16/2016   Lab Results  Component Value Date   FERRITIN 42 05/20/2016   IRON 76 05/20/2016   TIBC 339 05/20/2016   UIBC 262 05/20/2016   IRONPCTSAT 23 05/20/2016   Lab Results  Component Value Date   RETICCTPCT 1.5 01/12/2015   RBC 4.87 12/16/2016   RETICCTABS 63.2 01/12/2015   No results found for: KPAFRELGTCHN, LAMBDASER, KAPLAMBRATIO No results found for: IGGSERUM, IGA, IGMSERUM No results found for: Odetta Pink, SPEI   Chemistry      Component Value Date/Time   NA 138 06/12/2016 0522   NA 141 02/15/2016 0920   K 3.7  06/12/2016 0522   K 4.4 02/15/2016 0920   CL 103 06/12/2016 0522   CL 98 01/12/2015 1015   CO2 27 06/12/2016 0522   CO2 26 02/15/2016 0920   BUN 22 (H) 06/12/2016 0522   BUN 25.7 02/15/2016 0920   CREATININE 0.78 06/12/2016 0522   CREATININE 1.1 02/15/2016 0920      Component Value Date/Time   CALCIUM 8.9 06/12/2016 0522   CALCIUM 10.4 02/15/2016 0920   ALKPHOS 33 (L) 06/09/2016 2027   ALKPHOS 33 (L) 02/15/2016 0920   AST 22 06/09/2016 2027  AST 18 02/15/2016 0920   ALT 15 06/09/2016 2027   ALT 13 02/15/2016 0920   BILITOT 0.6 06/09/2016 2027   BILITOT 0.60 02/15/2016 0920     Impression and Plan: Ms. Laursen is a very pleasant 82 yo white female. She has some iron deficiency anemia.   Her hemoglobin is doing quite well.  It is holding steady. She feels well. I think this is a fairly good indicator that her iron studies should be okay.  I think we get her back in 6 months now. I still think her biggest issue will be her blood sugars. She is being fairly aggressive with them.   [name], MD 6/15/20189:18 AM

## 2016-12-19 ENCOUNTER — Telehealth: Payer: Self-pay | Admitting: *Deleted

## 2016-12-19 NOTE — Telephone Encounter (Addendum)
Patient is aware of results  ----- Message from [name], MD sent at 12/16/2016  4:15 PM EDT ----- Call - iron level is ok!!  Sara Holt

## 2016-12-20 ENCOUNTER — Other Ambulatory Visit: Payer: Medicare Other

## 2017-01-09 DIAGNOSIS — I6529 Occlusion and stenosis of unspecified carotid artery: Secondary | ICD-10-CM | POA: Insufficient documentation

## 2017-01-09 DIAGNOSIS — I4510 Unspecified right bundle-branch block: Secondary | ICD-10-CM | POA: Insufficient documentation

## 2017-01-09 DIAGNOSIS — R0602 Shortness of breath: Secondary | ICD-10-CM | POA: Insufficient documentation

## 2017-01-09 DIAGNOSIS — I739 Peripheral vascular disease, unspecified: Secondary | ICD-10-CM | POA: Insufficient documentation

## 2017-03-07 ENCOUNTER — Encounter (HOSPITAL_COMMUNITY): Payer: Medicare Other

## 2017-03-07 ENCOUNTER — Ambulatory Visit: Payer: Medicare Other | Admitting: Family

## 2017-03-28 ENCOUNTER — Ambulatory Visit (HOSPITAL_COMMUNITY)
Admission: RE | Admit: 2017-03-28 | Discharge: 2017-03-28 | Disposition: A | Discharge status: Home / Self Care | Payer: Medicare Other | Source: Ambulatory Visit | Attending: Family | Admitting: Family

## 2017-03-28 ENCOUNTER — Ambulatory Visit (INDEPENDENT_AMBULATORY_CARE_PROVIDER_SITE_OTHER): Payer: Medicare Other | Admitting: Family

## 2017-03-28 ENCOUNTER — Encounter: Payer: Self-pay | Admitting: Family

## 2017-03-28 VITALS — BP 138/79 | HR 69 | Temp 97.8°F | Resp 20 | Ht 64.0 in | Wt 160.0 lb

## 2017-03-28 DIAGNOSIS — I6523 Occlusion and stenosis of bilateral carotid arteries: Secondary | ICD-10-CM | POA: Diagnosis present

## 2017-03-28 DIAGNOSIS — I63233 Cerebral infarction due to unspecified occlusion or stenosis of bilateral carotid arteries: Secondary | ICD-10-CM | POA: Diagnosis not present

## 2017-03-28 DIAGNOSIS — Z9889 Other specified postprocedural states: Secondary | ICD-10-CM

## 2017-03-28 LAB — VAS US CAROTID
LCCADSYS: 91 cm/s
LEFT ECA DIAS: 0 cm/s
Left CCA dist dias: 10 cm/s
Left CCA prox dias: 20 cm/s
Left CCA prox sys: 88 cm/s
Left ICA dist dias: -21 cm/s
Left ICA dist sys: -84 cm/s
Left ICA prox dias: -21 cm/s
Left ICA prox sys: -73 cm/s
RCCADSYS: -80 cm/s
RCCAPDIAS: 11 cm/s
RIGHT CCA MID DIAS: 13 cm/s
RIGHT ECA DIAS: -14 cm/s
Right CCA prox sys: 67 cm/s

## 2017-03-28 NOTE — Progress Notes (Signed)
Chief Complaint: Follow up Extracranial Carotid Artery Stenosis   History of Present Illness  Sara Holt is a 82 y.o. female who is status post left CEA in January 2012 by Dr. Donnetta Hutching. She returns today for follow up. She has no history of stroke or TIA.  She has seen a neurologist and a cardiologist, then a hematologist for anemia. She was given iron infusions and feels much improved; has not had any more syncope or pre-syncope since the iron infusions in 2016. 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   Dr. Donnetta Hutching saw pt on 03/11/14 and did not feel that her syncope was related to her carotid artery stenosis at that time. In August 2015 she had a very brief syncopal episode after standing up and walking for a while. In 2012, just before the left CEA, she had transient left monocular blindness, denies unilateral facial drooping, denies hemiparesis, denies aphasia symptoms. Saw her opthalmologist after this.  She reports "bone on bone" of her right knee, states the left knee has been replaced.  Her walking is limited by right knee pain.  She has arthritis, including her right knee.  She denies any cardiac problems, states her last stress test was a few years ago to evaluate left arm and neck pain, pt states was normal, states c-spine vertabra issue found.  Pt smoker: non-smoker DM: A1C on 06-12-2016 was 7.2 (review of records)  Pt meds include: Statin : Yes ASA: Yes Other anticoagulants/antiplatelets: no    Past Medical History:  Diagnosis Date  . Allergy   . Anemia   . Arthritis    OA  . Asthma   . Carotid artery occlusion   . Diabetes mellitus    DIET CONTROLLED, NO MEDS  . Disc    problems in neck and back  . GERD (gastroesophageal reflux disease)   . Hyperlipidemia   .  Hypertension   . Neuromuscular disorder ([place])    nerve problems with hands  . Osteoporosis   . Sleep apnea     Social History Social History  Substance Use Topics  . Smoking status: Never Smoker  . Smokeless tobacco: Never Used  . Alcohol use 4.2 oz/week    7 Glasses of wine per week    Family History Family History  Problem Relation Age of Onset  . Diabetes Father   . Pneumonia Father   . Peripheral vascular disease Sister   . Colon cancer Maternal Grandmother   . Cancer Maternal Grandmother        Colon cancer  . Esophageal cancer Neg Hx   . Stomach cancer Neg Hx   . Rectal cancer Neg Hx     Surgical History Past Surgical History:  Procedure Laterality Date  . CAROTID ARTERY ANGIOPLASTY    . CAROTID ENDARTERECTOMY  Jan. 16,2012   LEFT cea  . carpel tunnel     both hands  . COLONOSCOPY    . JOINT REPLACEMENT Left 2011   Knee  . KNEE ARTHROSCOPY Left 2010  . POLYPECTOMY      Allergies  Allergen Reactions  . Buprenorphine Hcl Nausea And Vomiting    nausea  . Morphine And Related Nausea And Vomiting    nausea  . Sulfa Antibiotics Nausea And Vomiting    "deathly ill"    Current  Outpatient Prescriptions  Medication Sig Dispense Refill  . alendronate (FOSAMAX) 70 MG tablet Take 70 mg by mouth every 7 (seven) days. Take with a full glass of water on an empty stomach.    Marland Kitchen aspirin 81 MG tablet Take 81 mg by mouth daily.    Marland Kitchen azelastine (ASTELIN) 0.1 % nasal spray U 1 SPR IEN BID PRN  5  . canagliflozin (INVOKANA) 100 MG TABS tablet Take 100 mg by mouth daily before breakfast.    . carvedilol (COREG) 6.25 MG tablet Take 6.25 mg by mouth Twice daily.     . chlorpheniramine-HYDROcodone (TUSSIONEX) 10-8 MG/5ML SUER Take 5 mLs by mouth at bedtime as needed for cough.     . cholecalciferol (VITAMIN D) 1000 units tablet Take 1,000 Units by mouth daily.    . cloNIDine (CATAPRES) 0.1 MG tablet Take 0.1 mg by mouth as needed (if b/p >150).    . CYMBALTA 60 MG capsule  Take 1 tablet by mouth Daily.     Marland Kitchen desoximetasone (TOPICORT) 0.25 % cream Apply topically continuous as needed (skin allergy).     Marland Kitchen diclofenac sodium (VOLTAREN) 1 % GEL Apply 4 g topically as needed (pain).     . fluticasone (FLONASE) 50 MCG/ACT nasal spray     . fluticasone furoate-vilanterol (BREO ELLIPTA) 100-25 MCG/INH AEPB Inhale 1 puff into the lungs once.    . hydrochlorothiazide (HYDRODIURIL) 25 MG tablet Take 12.5 mg by mouth Daily.     Marland Kitchen ipratropium-albuterol (DUONEB) 0.5-2.5 (3) MG/3ML SOLN Take 3 mLs by nebulization every 4 (four) hours as needed. 360 mL 2  . loratadine (CLARITIN) 10 MG tablet Take 10 mg by mouth.    . montelukast (SINGULAIR) 10 MG tablet Take 10 mg by mouth at bedtime.    . ONE TOUCH ULTRA TEST test strip   0  . OVER THE COUNTER MEDICATION Place 1 drop into both eyes at bedtime. dry eyes med    . ramipril (ALTACE) 10 MG capsule Take 10 mg by mouth daily.     . simvastatin (ZOCOR) 10 MG tablet Take 10 mg by mouth Daily.     . SitaGLIPtin-MetFORMIN HCl (JANUMET XR) [id number] MG TB24 Take 1 tablet by mouth daily.    . traMADol (ULTRAM) 50 MG tablet Take 50 mg by mouth every 6 (six) hours as needed for moderate pain.     Marland Kitchen triamcinolone cream (KENALOG) 0.1 % Apply 1 application topically as needed (skin allergy).     Marland Kitchen guaiFENesin-dextromethorphan (ROBITUSSIN DM) 100-10 MG/5ML syrup Take 5 mLs by mouth every 4 (four) hours as needed for cough. (Patient not taking: Reported on 03/28/2017) 118 mL 0  . mometasone-formoterol (DULERA) 200-5 MCG/ACT AERO Inhale 2 puffs into the lungs 2 (two) times daily. (Patient not taking: Reported on 03/28/2017) 1 Inhaler 0   No current facility-administered medications for this visit.     Review of Systems : See HPI for pertinent positives and negatives.  Physical Examination  Vitals:   03/28/17 1408 03/28/17 1409  BP: 137/78 138/79  Pulse: 69   Resp: 20   Temp: 97.8 F (36.6 C)   TempSrc: Oral   SpO2: 97%   Weight: 160 lb  (72.6 kg)   Height: 5\' 4"  (1.626 m)    Body mass index is 27.46 kg/m.  General: WDWN female in NAD GAIT:normal Eyes: PERRLA Pulmonary: Respirations are non-labored, CTAB, good air movement  Cardiac: regular rhythm with occasional premature contractions, controlled rate, + murmur.  VASCULAR EXAM Carotid  Bruits Right Left   Positive Positive    Radial pulses are 2+ palpable and equal. Bilateral pedal pulses are palpable.     Gastrointestinal: soft, nontender, BS WNL, no r/g, no masses palpated.  Musculoskeletal: No muscle atrophy/wasting. M/S 5/5 throughout, Extremities without ischemic changes.  Neurologic: A&O X 3; Appropriate Affect, Speech is normal CN 2-12 intact, Pain and light touch intact in extremities, Motor exam as listed above   Assessment: ARIYEL JEANGILLES is a 82 y.o. female who is status post left CEA in January 2012. She has no history of stroke or TIA. It appears that the syncope and presyncope she was having was due to severe iron deficiency anemia. Since this was treated she has felt much improved with no further syncope or presyncope.  DATA Carotid Duplex (03/28/17): Right ICA: 40-59% stenosis. Left ICA: No restenosis of the CEA site.  Bilateral vertebral artery flow is antegrade.  Bilateral subclavian artery waveforms are normal.  Increased stenosis of the right ICA compared to the last exam on 03-02-16.  02/19/15 carotid duplex indicated 60-79% right ICA stenosis and widely patent left ICA stenosis.    Plan:  Discussed possible exercises she could do regularly that would not be painful to her knee or back: daily seated arm and leg exercises, senior water aerobics.   Follow-up in 1 year with Carotid Duplex scan.   I discussed in depth with the patient the nature of atherosclerosis, and  emphasized the importance of maximal medical management including strict control of blood pressure, blood glucose, and lipid levels, obtaining regular exercise, and continued cessation of smoking.  The patient is aware that without maximal medical management the underlying atherosclerotic disease process will progress, limiting the benefit of any interventions. The patient was given information about stroke prevention and what symptoms should prompt the patient to seek immediate medical care. Thank you for allowing [place] to participate in this patient's care.  Clemon Chambers, RN, MSN, FNP-C [facility] Office: [phone]  Clinic Physician: Early  03/28/17 2:19 PM

## 2017-03-28 NOTE — Patient Instructions (Signed)
Stroke Prevention Some medical conditions and behaviors are associated with an increased chance of having a stroke. You may prevent a stroke by making healthy choices and managing medical conditions. How can I reduce my risk of having a stroke?  Stay physically active. Get at least 30 minutes of activity on most or all days.  Do not smoke. It may also be helpful to avoid exposure to secondhand smoke.  Limit alcohol use. Moderate alcohol use is considered to be: ? No more than 2 drinks per day for men. ? No more than 1 drink per day for nonpregnant women.  Eat healthy foods. This involves: ? Eating 5 or more servings of fruits and vegetables a day. ? Making dietary changes that address high blood pressure (hypertension), high cholesterol, diabetes, or obesity.  Manage your cholesterol levels. ? Making food choices that are high in fiber and low in saturated fat, trans fat, and cholesterol may control cholesterol levels. ? Take any prescribed medicines to control cholesterol as directed by your health care provider.  Manage your diabetes. ? Controlling your carbohydrate and sugar intake is recommended to manage diabetes. ? Take any prescribed medicines to control diabetes as directed by your health care provider.  Control your hypertension. ? Making food choices that are low in salt (sodium), saturated fat, trans fat, and cholesterol is recommended to manage hypertension. ? Ask your health care provider if you need treatment to lower your blood pressure. Take any prescribed medicines to control hypertension as directed by your health care provider. ? If you are 18-39 years of age, have your blood pressure checked every 3-5 years. If you are 40 years of age or older, have your blood pressure checked every year.  Maintain a healthy weight. ? Reducing calorie intake and making food choices that are low in sodium, saturated fat, trans fat, and cholesterol are recommended to manage  weight.  Stop drug abuse.  Avoid taking birth control pills. ? Talk to your health care provider about the risks of taking birth control pills if you are over 35 years old, smoke, get migraines, or have ever had a blood clot.  Get evaluated for sleep disorders (sleep apnea). ? Talk to your health care provider about getting a sleep evaluation if you snore a lot or have excessive sleepiness.  Take medicines only as directed by your health care provider. ? For some people, aspirin or blood thinners (anticoagulants) are helpful in reducing the risk of forming abnormal blood clots that can lead to stroke. If you have the irregular heart rhythm of atrial fibrillation, you should be on a blood thinner unless there is a good reason you cannot take them. ? Understand all your medicine instructions.  Make sure that other conditions (such as anemia or atherosclerosis) are addressed. Get help right away if:  You have sudden weakness or numbness of the face, arm, or leg, especially on one side of the body.  Your face or eyelid droops to one side.  You have sudden confusion.  You have trouble speaking (aphasia) or understanding.  You have sudden trouble seeing in one or both eyes.  You have sudden trouble walking.  You have dizziness.  You have a loss of balance or coordination.  You have a sudden, severe headache with no known cause.  You have new chest pain or an irregular heartbeat. Any of these symptoms may represent a serious problem that is an emergency. Do not wait to see if the symptoms will go away.   Get medical help at once. Call your local emergency services (911 in [place] Do not drive yourself to the hospital. This information is not intended to replace advice given to you by your health care provider. Make sure you discuss any questions you have with your health care provider. Document Released: 07/28/2004 Document Revised: 11/26/2015 Document Reviewed: 12/21/2012 Elsevier  Interactive Patient Education  2017 Elsevier Inc.     Preventing Cerebrovascular Disease Arteries are blood vessels that carry blood that contains oxygen from the heart to all parts of the body. Cerebrovascular disease affects arteries that supply the brain. Any condition that blocks or disrupts blood flow to the brain can cause cerebrovascular disease. Brain cells that lose blood supply start to die within minutes (stroke). Stroke is the main danger of cerebrovascular disease. Atherosclerosis and high blood pressure are common causes of cerebrovascular disease. Atherosclerosis is narrowing and hardening of an artery that results when fat, cholesterol, calcium, or other substances (plaque) build up inside an artery. Plaque reduces blood flow through the artery. High blood pressure increases the risk of bleeding inside the brain. Making diet and lifestyle changes to prevent atherosclerosis and high blood pressure lowers your risk of cerebrovascular disease. What nutrition changes can be made?  Eat more fruits, vegetables, and whole grains.  Reduce how much saturated fat you eat. To do this, eat less red meat and fewer full-fat dairy products.  Eat healthy proteins instead of red meat. Healthy proteins include: ? Fish. Eat fish that contains heart-healthy omega-3 fatty acids, twice a week. Examples include salmon, albacore tuna, mackerel, and herring. ? Chicken. ? Nuts. ? Low-fat or nonfat yogurt.  Avoid processed meats, like bacon and lunchmeat.  Avoid foods that contain: ? A lot of sugar, such as sweets and drinks with added sugar. ? A lot of salt (sodium). Avoid adding extra salt to your food, as told by your health care provider. ? Trans fats, such as margarine and baked goods. Trans fats may be listed as "partially hydrogenated oils" on food labels.  Check food labels to see how much sodium, sugar, and trans fats are in foods.  Use vegetable oils that contain low amounts of  saturated fat, such as olive oil or canola oil. What lifestyle changes can be made?  Drink alcohol in moderation. This means no more than 1 drink a day for nonpregnant women and 2 drinks a day for men. One drink equals 12 oz of beer, 5 oz of wine, or 1 oz of hard liquor.  If you are overweight, ask your health care provider to recommend a weight-loss plan for you. Losing 5-10 lb (2.2-4.5 kg) can reduce your risk of diabetes, atherosclerosis, and high blood pressure.  Exercise for 30?60 minutes on most days, or as much as told by your health care provider. ? Do moderate-intensity exercise, such as brisk walking, bicycling, and water aerobics. Ask your health care provider which activities are safe for you.  Do not use any products that contain nicotine or tobacco, such as cigarettes and e-cigarettes. If you need help quitting, ask your health care provider. Why are these changes important? Making these changes lowers your risk of many diseases that can cause cerebrovascular disease and stroke. Stroke is a leading cause of death and disability. Making these changes also improves your overall health and quality of life. What can I do to lower my risk? The following factors make you more likely to develop cerebrovascular disease:  Being overweight.  Smoking.  Being physically inactive.    Eating a high-fat diet.  Having certain health conditions, such as: ? Diabetes. ? High blood pressure. ? Heart disease. ? Atherosclerosis. ? High cholesterol. ? Sickle cell disease.  Talk with your health care provider about your risk for cerebrovascular disease. Work with your health care provider to control diseases that you have that may contribute to cerebrovascular disease. Your health care provider may prescribe medicines to help prevent major causes of cerebrovascular disease. Where to find more information: Learn more about preventing cerebrovascular disease from:  National Heart, Lung, and  Blood Institute: www.nhlbi.nih.gov/health/health-topics/topics/stroke  Centers for Disease Control and Prevention: cdc.gov/stroke/about.htm  Summary  Cerebrovascular disease can lead to a stroke.  Atherosclerosis and high blood pressure are major causes of cerebrovascular disease.  Making diet and lifestyle changes can reduce your risk of cerebrovascular disease.  Work with your health care provider to get your risk factors under control to reduce your risk of cerebrovascular disease. This information is not intended to replace advice given to you by your health care provider. Make sure you discuss any questions you have with your health care provider. Document Released: 07/05/2015 Document Revised: 01/08/2016 Document Reviewed: 07/05/2015 Elsevier Interactive Patient Education  2018 Elsevier Inc.  

## 2017-05-31 NOTE — Addendum Note (Signed)
Addended by: Lianne Cure A on: 05/31/2017 11:24 AM   Modules accepted: Orders

## 2017-06-16 ENCOUNTER — Other Ambulatory Visit (HOSPITAL_BASED_OUTPATIENT_CLINIC_OR_DEPARTMENT_OTHER): Payer: Medicare Other

## 2017-06-16 ENCOUNTER — Other Ambulatory Visit: Payer: Self-pay

## 2017-06-16 ENCOUNTER — Telehealth: Payer: Self-pay | Admitting: *Deleted

## 2017-06-16 ENCOUNTER — Ambulatory Visit (HOSPITAL_BASED_OUTPATIENT_CLINIC_OR_DEPARTMENT_OTHER): Payer: Medicare Other | Admitting: Hematology & Oncology

## 2017-06-16 ENCOUNTER — Encounter: Payer: Self-pay | Admitting: Hematology & Oncology

## 2017-06-16 VITALS — BP 163/84 | HR 64 | Temp 98.0°F | Resp 18 | Wt 158.0 lb

## 2017-06-16 DIAGNOSIS — N189 Chronic kidney disease, unspecified: Secondary | ICD-10-CM

## 2017-06-16 DIAGNOSIS — D500 Iron deficiency anemia secondary to blood loss (chronic): Secondary | ICD-10-CM

## 2017-06-16 DIAGNOSIS — Z48812 Encounter for surgical aftercare following surgery on the circulatory system: Secondary | ICD-10-CM

## 2017-06-16 DIAGNOSIS — D509 Iron deficiency anemia, unspecified: Secondary | ICD-10-CM

## 2017-06-16 DIAGNOSIS — E119 Type 2 diabetes mellitus without complications: Secondary | ICD-10-CM | POA: Diagnosis not present

## 2017-06-16 DIAGNOSIS — G629 Polyneuropathy, unspecified: Secondary | ICD-10-CM | POA: Diagnosis not present

## 2017-06-16 DIAGNOSIS — D631 Anemia in chronic kidney disease: Secondary | ICD-10-CM

## 2017-06-16 LAB — CMP (CANCER CENTER ONLY)
ALBUMIN: 4 g/dL (ref 3.3–5.5)
ALK PHOS: 38 U/L (ref 26–84)
ALT: 21 U/L (ref 10–47)
AST: 20 U/L (ref 11–38)
BILIRUBIN TOTAL: 1.1 mg/dL (ref 0.20–1.60)
BUN, Bld: 18 mg/dL (ref 7–22)
CO2: 30 meq/L (ref 18–33)
CREATININE: 1.1 mg/dL (ref 0.6–1.2)
Calcium: 10.4 mg/dL — ABNORMAL HIGH (ref 8.0–10.3)
Chloride: 104 mEq/L (ref 98–108)
Glucose, Bld: 148 mg/dL — ABNORMAL HIGH (ref 73–118)
Potassium: 4.3 mEq/L (ref 3.3–4.7)
SODIUM: 141 meq/L (ref 128–145)
TOTAL PROTEIN: 6.3 g/dL — AB (ref 6.4–8.1)

## 2017-06-16 LAB — IRON AND TIBC
%SAT: 30 % (ref 21–57)
Iron: 98 ug/dL (ref 41–142)
TIBC: 328 ug/dL (ref 236–444)
UIBC: 229 ug/dL (ref 120–384)

## 2017-06-16 LAB — CBC WITH DIFFERENTIAL (CANCER CENTER ONLY)
BASO#: 0 10*3/uL (ref 0.0–0.2)
BASO%: 0.6 % (ref 0.0–2.0)
EOS ABS: 0.3 10*3/uL (ref 0.0–0.5)
EOS%: 4.6 % (ref 0.0–7.0)
HEMATOCRIT: 39.8 % (ref 34.8–46.6)
HGB: 13.6 g/dL (ref 11.6–15.9)
LYMPH#: 2.4 10*3/uL (ref 0.9–3.3)
LYMPH%: 33.2 % (ref 14.0–48.0)
MCH: 30.2 pg (ref 26.0–34.0)
MCHC: 34.2 g/dL (ref 32.0–36.0)
MCV: 88 fL (ref 81–101)
MONO#: 0.5 10*3/uL (ref 0.1–0.9)
MONO%: 7.5 % (ref 0.0–13.0)
NEUT%: 54.1 % (ref 39.6–80.0)
NEUTROS ABS: 3.9 10*3/uL (ref 1.5–6.5)
Platelets: 218 10*3/uL (ref 145–400)
RBC: 4.5 10*6/uL (ref 3.70–5.32)
RDW: 13 % (ref 11.1–15.7)
WBC: 7.2 10*3/uL (ref 3.9–10.0)

## 2017-06-16 LAB — RETICULOCYTES: Reticulocyte Count: 1.8 % (ref 0.6–2.6)

## 2017-06-16 LAB — FERRITIN: Ferritin: 38 ng/ml (ref 9–269)

## 2017-06-16 NOTE — Telephone Encounter (Addendum)
Patient is aware of results  ----- Message from [name], MD sent at 06/16/2017 12:47 PM EST ----- Call - calcium is NOT high!!!  Sara Holt

## 2017-06-16 NOTE — Progress Notes (Signed)
Hematology and Oncology Follow Up Visit  Sara Holt [id number] July 26, 1934 82 y.o. 06/16/2017   Principle Diagnosis:  1. Iron deficiency anemia 2. Anemia of renal insufficiency  Current Therapy:   IV iron as indicated - last dose given in December 2016    Interim History:  Ms. Sara Holt is here today for a follow-up.  She is doing okay.  Her main complaint has been tingling in the fingers.  All of her fingers are numb.  She has had this for a few weeks.  She has a neurologist.  She will see her neurologist in a few weeks.  She has had carpal tunnel surgery in the past.  She does have a neck problems.  She does have issues with diabetes.  Her last iron studies done back in June showed a ferritin of 37 with an iron saturation of 26%.  There is been no fever.  She has had no rashes.  She has had no leg swelling.  There is been no change in bowel or bladder habits.  She has a performance status of ECOG 1..  Medications:  Allergies as of 06/16/2017      Reactions   Buprenorphine Hcl Nausea And Vomiting   nausea   Morphine And Related Nausea And Vomiting   nausea   Sulfa Antibiotics Nausea And Vomiting   "deathly ill"   Morphine Nausea And Vomiting      Medication List        Accurate as of 06/16/17 10:50 AM. Always use your most recent med list.          alendronate 70 MG tablet Commonly known as:  FOSAMAX Take 70 mg by mouth every 7 (seven) days. Take with a full glass of water on an empty stomach.   aspirin 81 MG tablet Take 81 mg by mouth daily.   azelastine 0.1 % nasal spray Commonly known as:  ASTELIN U 1 SPR IEN BID PRN   carvedilol 12.5 MG tablet Commonly known as:  COREG TAKE 1 TABLET BY MOUTH TWO  TIMES DAILY   chlorpheniramine-HYDROcodone 10-8 MG/5ML Suer Commonly known as:  TUSSIONEX Take 5 mLs by mouth at bedtime as needed for cough.   cholecalciferol 1000 units tablet Commonly known as:  VITAMIN D Take 1,000 Units by mouth daily.   CYMBALTA 60 MG capsule Generic drug:  DULoxetine Take 1 tablet by mouth Daily.   desoximetasone 0.25 % cream Commonly known as:  TOPICORT Apply topically continuous as needed (skin allergy).   fluticasone 50 MCG/ACT nasal spray Commonly known as:  FLONASE   fluticasone furoate-vilanterol 100-25 MCG/INH Aepb Commonly known as:  BREO ELLIPTA Inhale 1 puff into the lungs once.   glimepiride 4 MG tablet Commonly known as:  AMARYL Take 4 mg by mouth.   guaiFENesin-dextromethorphan 100-10 MG/5ML syrup Commonly known as:  ROBITUSSIN DM Take 5 mLs by mouth every 4 (four) hours as needed for cough.   hydrochlorothiazide 25 MG tablet Commonly known as:  HYDRODIURIL Take 12.5 mg by mouth Daily.   INVOKANA 100 MG Tabs tablet Generic drug:  canagliflozin Take 100 mg by mouth daily before breakfast.   ipratropium-albuterol 0.5-2.5 (3) MG/3ML Soln Commonly known as:  DUONEB Take 3 mLs by nebulization every 4 (four) hours as needed.   JANUMET XR [phone] MG Tb24 Generic drug:  SitaGLIPtin-MetFORMIN HCl Take 1 tablet by mouth daily.   loratadine 10 MG tablet Commonly known as:  CLARITIN Take 10 mg by mouth.   metFORMIN 500 MG 24  hr tablet Commonly known as:  GLUCOPHAGE-XR   mometasone-formoterol 200-5 MCG/ACT Aero Commonly known as:  DULERA Inhale 2 puffs into the lungs 2 (two) times daily.   montelukast 10 MG tablet Commonly known as:  SINGULAIR Take 10 mg by mouth at bedtime.   omeprazole 40 MG capsule Commonly known as:  PRILOSEC Take 40 mg by mouth.   ONE TOUCH ULTRA TEST test strip Generic drug:  glucose blood   OVER THE COUNTER MEDICATION Place 1 drop into both eyes at bedtime. dry eyes med   ramipril 10 MG capsule Commonly known as:  ALTACE Take 10 mg by mouth daily.   simvastatin 10 MG tablet Commonly known as:  ZOCOR Take 10 mg by mouth Daily.   traMADol 50 MG tablet Commonly known as:  ULTRAM Take 50 mg by mouth every 6 (six) hours as needed for  moderate pain.   triamcinolone cream 0.1 % Commonly known as:  KENALOG Apply 1 application topically as needed (skin allergy).   VOLTAREN 1 % Gel Generic drug:  diclofenac sodium Apply 4 g topically as needed (pain).       Allergies:  Allergies  Allergen Reactions  . Buprenorphine Hcl Nausea And Vomiting    nausea  . Morphine And Related Nausea And Vomiting    nausea  . Sulfa Antibiotics Nausea And Vomiting    "deathly ill"  . Morphine Nausea And Vomiting    Past Medical History, Surgical history, Social history, and Family History were reviewed and updated.  Review of Systems: As stated in the interim history  Physical Exam:  weight is 158 lb (71.7 kg). Her oral temperature is 98 F (36.7 C). Her blood pressure is 163/84 (abnormal) and her pulse is 64. Her respiration is 18 and oxygen saturation is 98%.   Wt Readings from Last 3 Encounters:  06/16/17 158 lb (71.7 kg)  03/28/17 160 lb (72.6 kg)  12/16/16 161 lb (73 kg)    Physical Exam  Constitutional: She is oriented to person, place, and time.  HENT:  Head: Normocephalic and atraumatic.  Mouth/Throat: Oropharynx is clear and moist.  Eyes: EOM are normal. Pupils are equal, round, and reactive to light.  Neck: Normal range of motion.  Cardiovascular: Normal rate, regular rhythm and normal heart sounds.  Pulmonary/Chest: Effort normal and breath sounds normal.  Abdominal: Soft. Bowel sounds are normal.  Musculoskeletal: Normal range of motion. She exhibits no edema, tenderness or deformity.  Lymphadenopathy:    She has no cervical adenopathy.  Neurological: She is alert and oriented to person, place, and time.  There is tingling in the fingers.  She has no weakness.  She does have some osteoarthritic changes in her hands.  Skin: Skin is warm and dry. No rash noted. No erythema.  Psychiatric: She has a normal mood and affect. Her behavior is normal. Judgment and thought content normal.  Vitals  reviewed.    Lab Results  Component Value Date   WBC 7.2 06/16/2017   HGB 13.6 06/16/2017   HCT 39.8 06/16/2017   MCV 88 06/16/2017   PLT 218 06/16/2017   Lab Results  Component Value Date   FERRITIN 37 12/16/2016   IRON 87 12/16/2016   TIBC 328 12/16/2016   UIBC 241 12/16/2016   IRONPCTSAT 26 12/16/2016   Lab Results  Component Value Date   RETICCTPCT 1.5 01/12/2015   RBC 4.50 06/16/2017   RETICCTABS 63.2 01/12/2015   No results found for: KPAFRELGTCHN, LAMBDASER, KAPLAMBRATIO No results found for:  IGGSERUM, IGA, IGMSERUM No results found for: Odetta Pink, SPEI   Chemistry      Component Value Date/Time   NA 138 06/12/2016 0522   NA 141 02/15/2016 0920   K 3.7 06/12/2016 0522   K 4.4 02/15/2016 0920   CL 103 06/12/2016 0522   CL 98 01/12/2015 1015   CO2 27 06/12/2016 0522   CO2 26 02/15/2016 0920   BUN 22 (H) 06/12/2016 0522   BUN 25.7 02/15/2016 0920   CREATININE 0.78 06/12/2016 0522   CREATININE 1.1 02/15/2016 0920      Component Value Date/Time   CALCIUM 8.9 06/12/2016 0522   CALCIUM 10.4 02/15/2016 0920   ALKPHOS 33 (L) 06/09/2016 2027   ALKPHOS 33 (L) 02/15/2016 0920   AST 22 06/09/2016 2027   AST 18 02/15/2016 0920   ALT 15 06/09/2016 2027   ALT 13 02/15/2016 0920   BILITOT 0.6 06/09/2016 2027   BILITOT 0.60 02/15/2016 0920     Impression and Plan: Ms. Hassan is a very pleasant 82 yo white female. She has some iron deficiency anemia.   It sounds like this neuropathy might be coming from her neck.  She is diabetic.  However, if this was diabetic neuropathy, I would think her feet would also be a problem.  We will see what her iron studies show.  It is been 2 years since she had an iron infusion.  She may need one.  Her hemoglobin is trending downward.  We will plan to have her come back to see [place] in another 6 months.  [name], MD 12/14/201810:50 AM

## 2017-06-20 ENCOUNTER — Telehealth: Payer: Self-pay | Admitting: *Deleted

## 2017-06-20 NOTE — Telephone Encounter (Addendum)
Patient is aware of results  ----- Message from [name], MD sent at 06/16/2017  5:30 PM EST ----- Call - iron is ok!!  Laurey Arrow

## 2017-12-15 ENCOUNTER — Ambulatory Visit: Payer: Medicare Other | Admitting: Hematology & Oncology

## 2017-12-15 ENCOUNTER — Inpatient Hospital Stay: Payer: Medicare Other | Attending: Hematology & Oncology

## 2017-12-29 ENCOUNTER — Other Ambulatory Visit: Payer: Self-pay | Admitting: Internal Medicine

## 2017-12-29 DIAGNOSIS — R0600 Dyspnea, unspecified: Secondary | ICD-10-CM

## 2018-01-01 ENCOUNTER — Ambulatory Visit (INDEPENDENT_AMBULATORY_CARE_PROVIDER_SITE_OTHER): Payer: Medicare Other | Admitting: Internal Medicine

## 2018-01-01 DIAGNOSIS — R0600 Dyspnea, unspecified: Secondary | ICD-10-CM

## 2018-01-01 LAB — PULMONARY FUNCTION TEST
DL/VA % pred: 72 %
DL/VA: 3.5 ml/min/mmHg/L
DLCO unc % pred: 63 %
DLCO unc: 15.39 ml/min/mmHg
FEF 25-75 Post: 2.25 L/sec
FEF 25-75 Pre: 1.9 L/sec
FEF2575-%CHANGE-POST: 18 %
FEF2575-%PRED-POST: 166 %
FEF2575-%Pred-Pre: 141 %
FEV1-%CHANGE-POST: 6 %
FEV1-%Pred-Post: 114 %
FEV1-%Pred-Pre: 107 %
FEV1-POST: 2.17 L
FEV1-PRE: 2.04 L
FEV1FVC-%CHANGE-POST: 3 %
FEV1FVC-%Pred-Pre: 108 %
FEV6-%Change-Post: 3 %
FEV6-%Pred-Post: 108 %
FEV6-%Pred-Pre: 104 %
FEV6-PRE: 2.53 L
FEV6-Post: 2.62 L
FEV6FVC-%Change-Post: 0 %
FEV6FVC-%PRED-PRE: 104 %
FEV6FVC-%Pred-Post: 105 %
FVC-%CHANGE-POST: 2 %
FVC-%PRED-POST: 102 %
FVC-%PRED-PRE: 99 %
FVC-POST: 2.62 L
FVC-PRE: 2.55 L
POST FEV1/FVC RATIO: 83 %
PRE FEV6/FVC RATIO: 99 %
Post FEV6/FVC ratio: 100 %
Pre FEV1/FVC ratio: 80 %
RV % pred: 85 %
RV: 2.06 L
TLC % pred: 91 %
TLC: 4.61 L

## 2018-01-01 NOTE — Progress Notes (Signed)
PFT done today. 

## 2018-03-16 DIAGNOSIS — G5622 Lesion of ulnar nerve, left upper limb: Secondary | ICD-10-CM | POA: Insufficient documentation

## 2018-04-12 DIAGNOSIS — M542 Cervicalgia: Secondary | ICD-10-CM | POA: Insufficient documentation

## 2018-07-25 ENCOUNTER — Telehealth: Payer: Self-pay | Admitting: Hematology

## 2018-07-25 NOTE — Telephone Encounter (Signed)
Appointments scheduled patient notified and letter/calendar mailed per 1/22 staff message

## 2018-08-15 ENCOUNTER — Other Ambulatory Visit: Payer: Medicare Other

## 2018-08-15 ENCOUNTER — Ambulatory Visit: Payer: Medicare Other | Admitting: Family

## 2018-08-17 ENCOUNTER — Other Ambulatory Visit: Payer: Self-pay | Admitting: Family

## 2018-08-17 ENCOUNTER — Other Ambulatory Visit: Payer: Self-pay

## 2018-08-17 ENCOUNTER — Inpatient Hospital Stay (HOSPITAL_BASED_OUTPATIENT_CLINIC_OR_DEPARTMENT_OTHER): Payer: Medicare Other | Admitting: Family

## 2018-08-17 ENCOUNTER — Inpatient Hospital Stay: Payer: Medicare Other | Attending: Hematology & Oncology

## 2018-08-17 VITALS — BP 148/59 | HR 78 | Temp 97.8°F | Resp 18 | Wt 166.0 lb

## 2018-08-17 DIAGNOSIS — D631 Anemia in chronic kidney disease: Secondary | ICD-10-CM

## 2018-08-17 DIAGNOSIS — D509 Iron deficiency anemia, unspecified: Secondary | ICD-10-CM | POA: Diagnosis not present

## 2018-08-17 DIAGNOSIS — D500 Iron deficiency anemia secondary to blood loss (chronic): Secondary | ICD-10-CM

## 2018-08-17 LAB — CMP (CANCER CENTER ONLY)
ALBUMIN: 4.9 g/dL (ref 3.5–5.0)
ALT: 13 U/L (ref 0–44)
AST: 19 U/L (ref 15–41)
Alkaline Phosphatase: 35 U/L — ABNORMAL LOW (ref 38–126)
Anion gap: 8 (ref 5–15)
BILIRUBIN TOTAL: 0.6 mg/dL (ref 0.3–1.2)
BUN: 23 mg/dL (ref 8–23)
CO2: 31 mmol/L (ref 22–32)
Calcium: 11.3 mg/dL — ABNORMAL HIGH (ref 8.9–10.3)
Chloride: 102 mmol/L (ref 98–111)
Creatinine: 1.38 mg/dL — ABNORMAL HIGH (ref 0.44–1.00)
GFR, Est AFR Am: 41 mL/min — ABNORMAL LOW (ref >60)
GFR, Estimated: 36 mL/min — ABNORMAL LOW (ref >60)
GLUCOSE: 108 mg/dL — AB (ref 70–99)
POTASSIUM: 4 mmol/L (ref 3.5–5.1)
Sodium: 141 mmol/L (ref 135–145)
TOTAL PROTEIN: 6.7 g/dL (ref 6.5–8.1)

## 2018-08-17 LAB — CBC WITH DIFFERENTIAL (CANCER CENTER ONLY)
ABS IMMATURE GRANULOCYTES: 0.04 10*3/uL (ref 0.00–0.07)
BASOS ABS: 0.1 10*3/uL (ref 0.0–0.1)
Basophils Relative: 1 %
Eosinophils Absolute: 0.3 10*3/uL (ref 0.0–0.5)
Eosinophils Relative: 4 %
HEMATOCRIT: 38.7 % (ref 36.0–46.0)
Hemoglobin: 12.8 g/dL (ref 12.0–15.0)
Immature Granulocytes: 1 %
LYMPHS ABS: 2.5 10*3/uL (ref 0.7–4.0)
Lymphocytes Relative: 36 %
MCH: 29.3 pg (ref 26.0–34.0)
MCHC: 33.1 g/dL (ref 30.0–36.0)
MCV: 88.6 fL (ref 80.0–100.0)
Monocytes Absolute: 0.6 10*3/uL (ref 0.1–1.0)
Monocytes Relative: 8 %
NEUTROS PCT: 50 %
NRBC: 0 % (ref 0.0–0.2)
Neutro Abs: 3.6 10*3/uL (ref 1.7–7.7)
Platelet Count: 248 10*3/uL (ref 150–400)
RBC: 4.37 MIL/uL (ref 3.87–5.11)
RDW: 12.8 % (ref 11.5–15.5)
WBC: 7 10*3/uL (ref 4.0–10.5)

## 2018-08-17 LAB — RETICULOCYTES
Immature Retic Fract: 17.7 % — ABNORMAL HIGH (ref 2.3–15.9)
RBC.: 4.37 MIL/uL (ref 3.87–5.11)
Retic Count, Absolute: 87.8 10*3/uL (ref 19.0–186.0)
Retic Ct Pct: 2 % (ref 0.4–3.1)

## 2018-08-17 NOTE — Progress Notes (Signed)
Hematology and Oncology Follow Up Visit  ALEXSYS Holt [id number] [birth year] 84 y.o. 08/17/2018   Principle Diagnosis:  Iron deficiency anemia Anemia of renal insufficiency  Current Therapy:   IV iron as indicated   Interim History:  Ms. Sara Holt is here today to re establish care. We last saw her in December 2018.  She is symptomatic with fatigue, weakness, and SOB with over exertion (stairs).  She states that she needs to heave a right knee replacement but won't have it until her husband has his back and hip procedures completed.  She has history of hemorrhoids but has not noted any recent bleeding. She uses preporation H as needed.  No bruising or petechiae.  No fever, chills, n/v, cough, rash, dizziness, chest pain, palpitations, abdominal pain or changes in bowel or bladder habits.  No swelling in her extremities at this time.  She has numbness and tingling in her hand due to recurrent carpal tunnel.  No lymphadenopathy noted on exam.  She has maintained a good appetite and is staying well hydrated. Her weight is stable.   ECOG Performance Status: 1 - Symptomatic but completely ambulatory  Medications:  Allergies as of 08/17/2018      Reactions   Buprenorphine Hcl Nausea And Vomiting   nausea   Morphine And Related Nausea And Vomiting   nausea   Sulfa Antibiotics Nausea And Vomiting   "deathly ill"   Morphine Nausea And Vomiting      Medication List       Accurate as of August 17, 2018  2:14 PM. Always use your most recent med list.        alendronate 70 MG tablet Commonly known as:  FOSAMAX Take 70 mg by mouth every 7 (seven) days. Take with a full glass of water on an empty stomach.   aspirin 81 MG tablet Take 81 mg by mouth daily.   azelastine 0.1 % nasal spray Commonly known as:  ASTELIN U 1 SPR IEN BID PRN   carvedilol 12.5 MG tablet Commonly known as:  COREG TAKE 1 TABLET BY MOUTH TWO  TIMES DAILY   chlorpheniramine-HYDROcodone 10-8 MG/5ML  Suer Commonly known as:  TUSSIONEX Take 5 mLs by mouth at bedtime as needed for cough.   cholecalciferol 1000 units tablet Commonly known as:  VITAMIN D Take 1,000 Units by mouth daily.   CYMBALTA 60 MG capsule Generic drug:  DULoxetine Take 1 tablet by mouth Daily.   desoximetasone 0.25 % cream Commonly known as:  TOPICORT Apply topically continuous as needed (skin allergy).   fluticasone 50 MCG/ACT nasal spray Commonly known as:  FLONASE   fluticasone furoate-vilanterol 100-25 MCG/INH Aepb Commonly known as:  BREO ELLIPTA Inhale 1 puff into the lungs once.   glimepiride 4 MG tablet Commonly known as:  AMARYL Take 4 mg by mouth.   guaiFENesin-dextromethorphan 100-10 MG/5ML syrup Commonly known as:  ROBITUSSIN DM Take 5 mLs by mouth every 4 (four) hours as needed for cough.   hydrochlorothiazide 25 MG tablet Commonly known as:  HYDRODIURIL Take 12.5 mg by mouth Daily.   INVOKANA 100 MG Tabs tablet Generic drug:  canagliflozin Take 100 mg by mouth daily before breakfast.   ipratropium-albuterol 0.5-2.5 (3) MG/3ML Soln Commonly known as:  DUONEB Take 3 mLs by nebulization every 4 (four) hours as needed.   JANUMET XR [phone] MG Tb24 Generic drug:  SitaGLIPtin-MetFORMIN HCl Take 1 tablet by mouth daily.   loratadine 10 MG tablet Commonly known as:  CLARITIN Take 10  mg by mouth.   metFORMIN 500 MG 24 hr tablet Commonly known as:  GLUCOPHAGE-XR   mometasone-formoterol 200-5 MCG/ACT Aero Commonly known as:  DULERA Inhale 2 puffs into the lungs 2 (two) times daily.   montelukast 10 MG tablet Commonly known as:  SINGULAIR Take 10 mg by mouth at bedtime.   omeprazole 40 MG capsule Commonly known as:  PRILOSEC Take 40 mg by mouth.   ONE TOUCH ULTRA TEST test strip Generic drug:  glucose blood   OVER THE COUNTER MEDICATION Place 1 drop into both eyes at bedtime. dry eyes med   ramipril 10 MG capsule Commonly known as:  ALTACE Take 10 mg by mouth daily.    simvastatin 10 MG tablet Commonly known as:  ZOCOR Take 10 mg by mouth Daily.   traMADol 50 MG tablet Commonly known as:  ULTRAM Take 50 mg by mouth every 6 (six) hours as needed for moderate pain.   triamcinolone cream 0.1 % Commonly known as:  KENALOG Apply 1 application topically as needed (skin allergy).   VOLTAREN 1 % Gel Generic drug:  diclofenac sodium Apply 4 g topically as needed (pain).       Allergies:  Allergies  Allergen Reactions  . Buprenorphine Hcl Nausea And Vomiting    nausea  . Morphine And Related Nausea And Vomiting    nausea  . Sulfa Antibiotics Nausea And Vomiting    "deathly ill"  . Morphine Nausea And Vomiting    Past Medical History, Surgical history, Social history, and Family History were reviewed and updated.  Review of Systems: All other 10 point review of systems is negative.   Physical Exam:  vitals were not taken for this visit.   Wt Readings from Last 3 Encounters:  06/16/17 158 lb (71.7 kg)  03/28/17 160 lb (72.6 kg)  12/16/16 161 lb (73 kg)    Ocular: Sclerae unicteric, pupils equal, round and reactive to light Ear-nose-throat: Oropharynx clear, dentition fair Lymphatic: No cervical, supraclavicular or axillary adenopathy Lungs no rales or rhonchi, good excursion bilaterally Heart regular rate and rhythm, no murmur appreciated Abd soft, nontender, positive bowel sounds, no liver or spleen tip palpated on exam, no fluid wave  MSK no focal spinal tenderness, no joint edema Neuro: non-focal, well-oriented, appropriate affect Breasts: Deferred  Lab Results  Component Value Date   WBC 7.0 08/17/2018   HGB 12.8 08/17/2018   HCT 38.7 08/17/2018   MCV 88.6 08/17/2018   PLT 248 08/17/2018   Lab Results  Component Value Date   FERRITIN 38 06/16/2017   IRON 98 06/16/2017   TIBC 328 06/16/2017   UIBC 229 06/16/2017   IRONPCTSAT 30 06/16/2017   Lab Results  Component Value Date   RETICCTPCT 2.0 08/17/2018   RBC 4.37  08/17/2018   RBC 4.37 08/17/2018   RETICCTABS 63.2 01/12/2015   No results found for: KPAFRELGTCHN, LAMBDASER, KAPLAMBRATIO No results found for: IGGSERUM, IGA, IGMSERUM No results found for: Odetta Pink, SPEI   Chemistry      Component Value Date/Time   NA 141 08/17/2018 1323   NA 141 06/16/2017 1114   NA 141 02/15/2016 0920   K 4.0 08/17/2018 1323   K 4.3 06/16/2017 1114   K 4.4 02/15/2016 0920   CL 102 08/17/2018 1323   CL 104 06/16/2017 1114   CO2 31 08/17/2018 1323   CO2 30 06/16/2017 1114   CO2 26 02/15/2016 0920   BUN 23 08/17/2018 1323  BUN 18 06/16/2017 1114   BUN 25.7 02/15/2016 0920   CREATININE 1.38 (H) 08/17/2018 1323   CREATININE 1.1 06/16/2017 1114   CREATININE 1.1 02/15/2016 0920      Component Value Date/Time   CALCIUM 11.3 (H) 08/17/2018 1323   CALCIUM 10.4 (H) 06/16/2017 1114   CALCIUM 10.4 02/15/2016 0920   ALKPHOS 35 (L) 08/17/2018 1323   ALKPHOS 38 06/16/2017 1114   ALKPHOS 33 (L) 02/15/2016 0920   AST 19 08/17/2018 1323   AST 18 02/15/2016 0920   ALT 13 08/17/2018 1323   ALT 21 06/16/2017 1114   ALT 13 02/15/2016 0920   BILITOT 0.6 08/17/2018 1323   BILITOT 0.60 02/15/2016 0920       Impression and Plan: Ms. Sadlowski is a very pleasant 84 yo caucas[place] female with history of iron deficiency. She is symptomatic as mentioned above. hgb is stable at 12.8, MCV 88.  We will see what her iron studies show and bring her back in for infusion if needed.  We will plan to see her back in another 4 months.  She will contact our office with any questions or concerns. We can certainly see her sooner if need be.   Laverna Peace, NP 2/14/20202:14 PM

## 2018-08-18 LAB — ERYTHROPOIETIN: Erythropoietin: 23.1 m[IU]/mL — ABNORMAL HIGH (ref 2.6–18.5)

## 2018-08-20 LAB — IRON AND TIBC
IRON: 79 ug/dL (ref 41–142)
Saturation Ratios: 19 % — ABNORMAL LOW (ref 21–57)
TIBC: 409 ug/dL (ref 236–444)
UIBC: 330 ug/dL (ref 120–384)

## 2018-08-21 LAB — FERRITIN: Ferritin: 13 ng/mL (ref 11–307)

## 2018-08-23 ENCOUNTER — Telehealth: Payer: Self-pay | Admitting: Family

## 2018-08-23 NOTE — Telephone Encounter (Signed)
Appointment scheduled for IRON and patient notified per 2/20 sch msg °

## 2018-08-29 ENCOUNTER — Inpatient Hospital Stay: Payer: Medicare Other

## 2018-08-29 VITALS — BP 96/60 | HR 59 | Temp 97.8°F | Resp 16

## 2018-08-29 DIAGNOSIS — D509 Iron deficiency anemia, unspecified: Secondary | ICD-10-CM | POA: Diagnosis not present

## 2018-08-29 DIAGNOSIS — D500 Iron deficiency anemia secondary to blood loss (chronic): Secondary | ICD-10-CM

## 2018-08-29 MED ORDER — SODIUM CHLORIDE 0.9 % IV SOLN
Freq: Once | INTRAVENOUS | Status: AC
  Administered 2018-08-29: 14:00:00 via INTRAVENOUS
  Filled 2018-08-29: qty 250

## 2018-08-29 MED ORDER — SODIUM CHLORIDE 0.9 % IV SOLN
510.0000 mg | Freq: Once | INTRAVENOUS | Status: AC
  Administered 2018-08-29: 510 mg via INTRAVENOUS
  Filled 2018-08-29: qty 17

## 2018-08-29 NOTE — Patient Instructions (Signed)

## 2018-09-03 ENCOUNTER — Encounter: Payer: Self-pay | Admitting: Family

## 2018-09-13 ENCOUNTER — Other Ambulatory Visit: Payer: Self-pay

## 2018-09-13 DIAGNOSIS — Z9889 Other specified postprocedural states: Secondary | ICD-10-CM

## 2018-09-13 DIAGNOSIS — I6523 Occlusion and stenosis of bilateral carotid arteries: Secondary | ICD-10-CM

## 2018-09-18 ENCOUNTER — Ambulatory Visit (INDEPENDENT_AMBULATORY_CARE_PROVIDER_SITE_OTHER): Payer: Medicare Other | Admitting: Family

## 2018-09-18 ENCOUNTER — Ambulatory Visit (HOSPITAL_COMMUNITY)
Admission: RE | Admit: 2018-09-18 | Discharge: 2018-09-18 | Disposition: A | Discharge status: Home / Self Care | Payer: Medicare Other | Source: Ambulatory Visit | Attending: Family | Admitting: Family

## 2018-09-18 ENCOUNTER — Encounter: Payer: Self-pay | Admitting: Family

## 2018-09-18 ENCOUNTER — Other Ambulatory Visit: Payer: Self-pay

## 2018-09-18 VITALS — BP 112/70 | HR 57 | Temp 98.0°F | Resp 16 | Ht 64.0 in | Wt 164.0 lb

## 2018-09-18 DIAGNOSIS — Z9889 Other specified postprocedural states: Secondary | ICD-10-CM

## 2018-09-18 DIAGNOSIS — I6523 Occlusion and stenosis of bilateral carotid arteries: Secondary | ICD-10-CM | POA: Insufficient documentation

## 2018-09-18 NOTE — Progress Notes (Signed)
Chief Complaint: Follow up Extracranial Carotid Artery Stenosis   History of Present Illness  Sara Holt is a 84 y.o. female who is status post left CEA in January 2012 by Dr. Donnetta Hutching. She returns today for follow up. She had amaurosis in her left eye prior to the left CEA, no subsequent neurological events.   She has seen a neurologist and a cardiologist, then a hematologist for anemia. She was given iron infusions and feels much improved; has not had any more syncope or pre-syncope since the iron infusions in 2016. 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   Dr. Donnetta Hutching saw pt on 03/11/14 and did not feel that her syncope was related to her carotid artery stenosis at that time. In August 2015 she had a very brief syncopal episode after standing up and walking for a while. In 2012, just before the left CEA, she had transient left monocular blindness, denies unilateral facial drooping, denies hemiparesis, denies aphasia symptoms. Saw her opthalmologist after this.  She reports "bone on bone" of her right knee, states the left knee has been replaced.  Her walking is limited by right knee pain.  She has arthritis, including her right knee.  She states her last stress test was a few years ago to evaluate left arm and neck pain, pt states was normal, states c-spine vertabra issue found.  She states that she needs cataract surgery, and possibly right knee replacement, had her left knee replaced.    Diabetic: yes, states her last A1C was 6.6 Tobacco use: none  Pt meds include: Statin : yes ASA: no, she stopped taking, states she though acetaminophen that she takes would replace it, I advised her to resume 81 mg daily Other anticoagulants/antiplatelets: no   Past Medical History:  Diagnosis Date  .  Allergy   . Anemia   . Arthritis    OA  . Asthma   . Carotid artery occlusion   . Diabetes mellitus    DIET CONTROLLED, NO MEDS  . Disc    problems in neck and back  . GERD (gastroesophageal reflux disease)   . Hyperlipidemia   . Hypertension   . Neuromuscular disorder ([place])    nerve problems with hands  . Osteoporosis   . Sleep apnea     Social History Social History   Tobacco Use  . Smoking status: Never Smoker  . Smokeless tobacco: Never Used  Substance Use Topics  . Alcohol use: Yes    Alcohol/week: 7.0 standard drinks    Types: 7 Glasses of wine per week  . Drug use: No    Family History Family History  Problem Relation Age of Onset  . Diabetes Father   . Pneumonia Father   . Peripheral vascular disease Sister   . Colon cancer Maternal Grandmother   . Cancer Maternal Grandmother        Colon cancer  . Esophageal cancer Neg Hx   . Stomach cancer Neg Hx   . Rectal cancer Neg Hx     Surgical History Past Surgical History:  Procedure Laterality Date  . CAROTID ARTERY ANGIOPLASTY    . CAROTID ENDARTERECTOMY  Jan. 16,2012   LEFT cea  . carpel tunnel     both hands  . COLONOSCOPY    . JOINT REPLACEMENT Left 2011  Knee  . KNEE ARTHROSCOPY Left 2010  . POLYPECTOMY      Allergies  Allergen Reactions  . Buprenorphine Hcl Nausea And Vomiting    nausea Other reaction(s): Nausea And Vomiting nausea nausea  . Morphine And Related Nausea And Vomiting    nausea  . Sulfa Antibiotics Nausea And Vomiting    "deathly ill"  . Sulfacetamide Sodium Nausea And Vomiting    "deathly ill"  . Morphine Nausea And Vomiting    Other reaction(s): Nausea And Vomiting    Current Outpatient Medications  Medication Sig Dispense Refill  . azelastine (ASTELIN) 0.1 % nasal spray U 1 SPR IEN BID PRN  5  . carvedilol (COREG) 12.5 MG tablet TAKE 1 TABLET BY MOUTH TWO  TIMES DAILY    . cholecalciferol (VITAMIN D) 1000 units tablet Take 1,000 Units by mouth daily.    .  CYMBALTA 60 MG capsule Take 1 tablet by mouth daily.     Marland Kitchen desoximetasone (TOPICORT) 0.25 % cream Apply topically continuous as needed (skin allergy).     Marland Kitchen diclofenac sodium (VOLTAREN) 1 % GEL Apply 4 g topically as needed (pain).     . fluticasone (FLONASE) 50 MCG/ACT nasal spray     . fluticasone furoate-vilanterol (BREO ELLIPTA) 100-25 MCG/INH AEPB Inhale 1 puff into the lungs once.    Marland Kitchen glimepiride (AMARYL) 4 MG tablet Take 4 mg by mouth.    Marland Kitchen ipratropium-albuterol (DUONEB) 0.5-2.5 (3) MG/3ML SOLN Take 3 mLs by nebulization every 4 (four) hours as needed. 360 mL 2  . loratadine (CLARITIN) 10 MG tablet Take 10 mg by mouth.    . losartan-hydrochlorothiazide (HYZAAR) 50-12.5 MG tablet Take 1 tablet by mouth daily.    . metFORMIN (GLUCOPHAGE-XR) 500 MG 24 hr tablet     . methocarbamol (ROBAXIN) 500 MG tablet Take 500 mg by mouth daily as needed.    Marland Kitchen omeprazole (PRILOSEC) 40 MG capsule Take 40 mg by mouth.    . ONE TOUCH ULTRA TEST test strip   0  . OVER THE COUNTER MEDICATION Place 1 drop into both eyes at bedtime. dry eyes med    . oxybutynin (DITROPAN) 5 MG tablet Take 5 mg by mouth daily.    . simvastatin (ZOCOR) 10 MG tablet Take 10 mg by mouth Daily.     . ramipril (ALTACE) 10 MG capsule Take 10 mg by mouth daily.     . SitaGLIPtin-MetFORMIN HCl (JANUMET XR) [phone] MG TB24 Take 1 tablet by mouth daily.     No current facility-administered medications for this visit.     Review of Systems : See HPI for pertinent positives and negatives.  Physical Examination  Vitals:   09/18/18 1131 09/18/18 1140  BP: 127/60 112/70  Pulse: (!) 57 (!) 57  Resp: 16   Temp: 98 F (36.7 C)   TempSrc: Oral   SpO2: 93%   Weight: 164 lb (74.4 kg)   Height: 5\' 4"  (1.626 m)    Body mass index is 28.15 kg/m.  General: WDWN female in NAD GAIT: normal Eyes: PERRLA HENT: No gross abnormalities.  Pulmonary:  Respirations are non-labored, good air movement in all fields, CTAB, no rales, rhonchi,  or  Wheezes. Cardiac: irregular rhythm with controled rate, no detected murmur.  VASCULAR EXAM Carotid Bruits Right Left   Negative Negative     Abdominal aortic pulse is not palpable. Radial pulses are 2+ palpable and equal.  LE Pulses Right Left       POPLITEAL  not palpable   not palpable       POSTERIOR TIBIAL  not palpable   not palpable        DORSALIS PEDIS      ANTERIOR TIBIAL Faintly palpable  faintly palpable     Gastrointestinal: soft, nontender, BS WNL, no r/g, no palpable masses. Musculoskeletal: no muscle atrophy/wasting. M/S 5/5 throughout, extremities without ischemic changes. Skin: No rashes, no ulcers, no cellulitis.   Neurologic:  A&O X 3; appropriate affect, sensation is normal; speech is normal, CN 2-12 intact, pain and light touch intact in extremities, motor exam as listed above. Psychiatric: Normal thought content, mood appropriate to clinical situation.    Assessment: Sara Holt is a 84 y.o. female who is status post left CEA in January 2012. She had amaurosis of her left eye before her left CEA, no neurological events since then.   It appears that the syncope and presyncope she was having was due to severe iron deficiency anemia. Since this was treated she has felt much improved with no further syncope or presyncope.  Her cardiac rhythm is irregular on exam, pt denies any known hx of irregular heart beat, states she has known right BBB, is not taking any anticoagulants. She is asymptomatic of this however, denies feeling light headed, denies dyspnea or chest pain, denies feeling any palpitations.  I advised her to call her PCP's office today re this and seek their advice.  I also advised her to resume the daily 81 mg ASA. She denies any bleeding issues, denies allergy to ASA.    DATA  Carotid Duplex (09-18-18): Right  Carotid: Velocities in the right ICA are consistent with a 60-79%                stenosis. The ECA appears >50% stenosed. Left Carotid: Velocities in the left ICA are consistent with a 1-39% stenosis.               The ECA appears >50% stenosed. Vertebrals:  Bilateral vertebral arteries demonstrate antegrade flow. Subclavians: Normal flow hemodynamics were seen in bilateral subclavian              arteries. Increased stenosis of the bilateral ICA compared to the exam on 03-28-17. However, 02/19/15 carotid duplex indicated 60-79% right ICA stenosis.  Plan: Follow-up in 6 months with Carotid Duplex scan.   I discussed in depth with the patient the nature of atherosclerosis, and emphasized the importance of maximal medical management including strict control of blood pressure, blood glucose, and lipid levels, obtaining regular exercise, and continued cessation of smoking.  The patient is aware that without maximal medical management the underlying atherosclerotic disease process will progress, limiting the benefit of any interventions. The patient was given information about stroke prevention and what symptoms should prompt the patient to seek immediate medical care. Thank you for allowing [place] to participate in this patient's care.  Clemon Chambers, RN, MSN, FNP-C [facility] Office: [phone]  Clinic Physician: Early  09/18/18 12:07 PM

## 2018-09-18 NOTE — Patient Instructions (Signed)

## 2018-11-12 IMAGING — CR DG CHEST 2V
2 series · 2 of 2 positions shown · non-contrast
Comparison: 08/07/2015

CLINICAL DATA: Cough and congestion for 3 weeks.

EXAM:
CHEST  2 VIEW

[w chest pa]
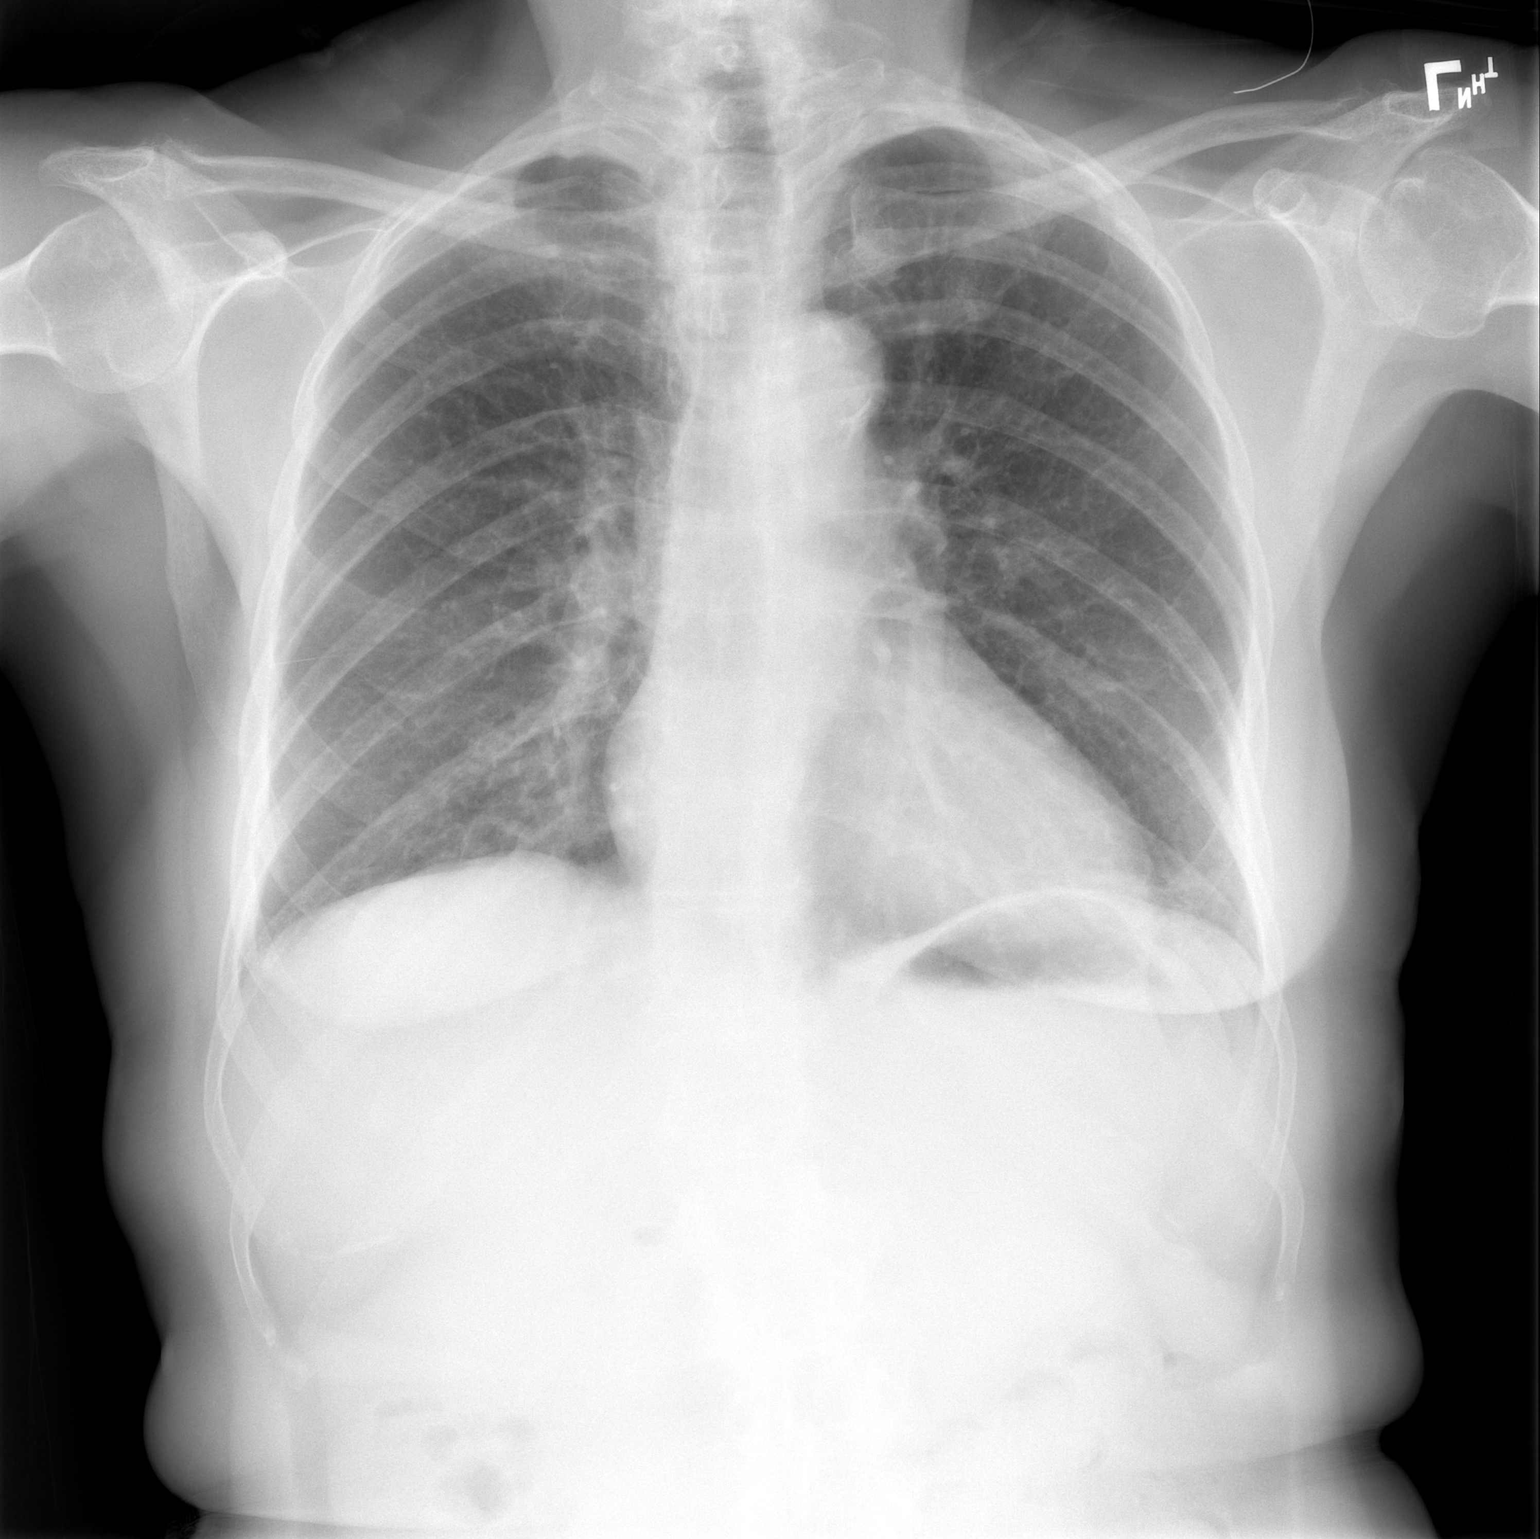

[w chest lat]
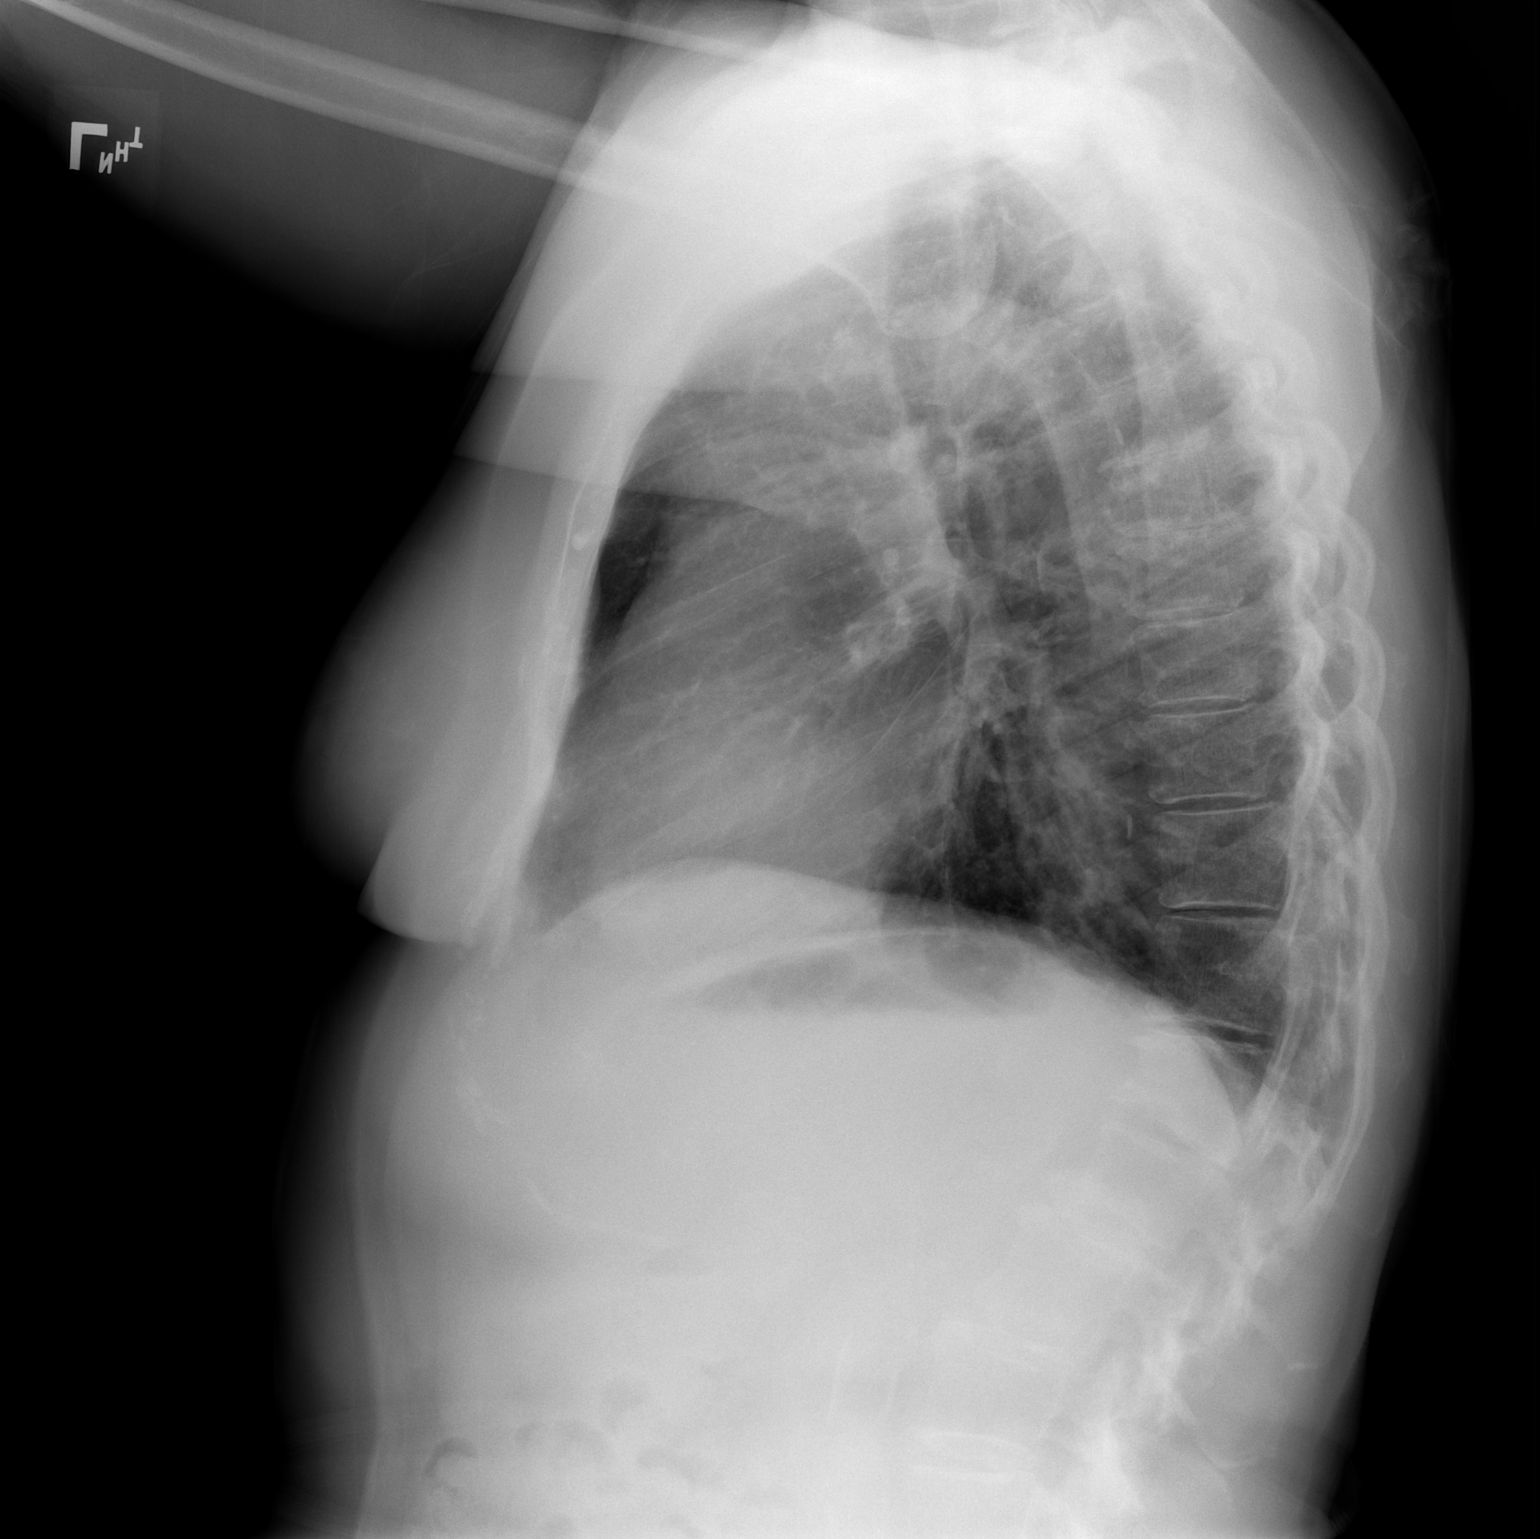

[2 of 2 positions shown; findings below may reference images not displayed]

FINDINGS: The cardiomediastinal contours are normal. There is atherosclerosis
of thoracic aorta. Mild left basilar atelectasis. Pulmonary
vasculature is normal. No consolidation, pleural effusion, or
pneumothorax. No acute osseous abnormalities are seen. Degenerative
change in the lower thoracic spine.
IMPRESSION: 1. Mild left basilar atelectasis.
2. Aortic atherosclerosis.

## 2018-12-17 ENCOUNTER — Inpatient Hospital Stay: Payer: Medicare Other | Attending: Family | Admitting: Family

## 2018-12-17 ENCOUNTER — Other Ambulatory Visit: Payer: Self-pay

## 2018-12-17 ENCOUNTER — Inpatient Hospital Stay: Payer: Medicare Other

## 2018-12-17 VITALS — BP 150/58 | HR 54 | Temp 98.0°F | Resp 18 | Ht 64.0 in | Wt 164.8 lb

## 2018-12-17 DIAGNOSIS — D500 Iron deficiency anemia secondary to blood loss (chronic): Secondary | ICD-10-CM

## 2018-12-17 DIAGNOSIS — N289 Disorder of kidney and ureter, unspecified: Secondary | ICD-10-CM | POA: Diagnosis not present

## 2018-12-17 DIAGNOSIS — D509 Iron deficiency anemia, unspecified: Secondary | ICD-10-CM | POA: Diagnosis present

## 2018-12-17 LAB — CBC WITH DIFFERENTIAL (CANCER CENTER ONLY)
Abs Immature Granulocytes: 0.09 10*3/uL — ABNORMAL HIGH (ref 0.00–0.07)
Basophils Absolute: 0.1 10*3/uL (ref 0.0–0.1)
Basophils Relative: 1 %
Eosinophils Absolute: 0.5 10*3/uL (ref 0.0–0.5)
Eosinophils Relative: 5 %
HCT: 40.9 % (ref 36.0–46.0)
Hemoglobin: 13.7 g/dL (ref 12.0–15.0)
Immature Granulocytes: 1 %
Lymphocytes Relative: 30 %
Lymphs Abs: 2.7 10*3/uL (ref 0.7–4.0)
MCH: 30.7 pg (ref 26.0–34.0)
MCHC: 33.5 g/dL (ref 30.0–36.0)
MCV: 91.7 fL (ref 80.0–100.0)
Monocytes Absolute: 0.6 10*3/uL (ref 0.1–1.0)
Monocytes Relative: 6 %
Neutro Abs: 5.1 10*3/uL (ref 1.7–7.7)
Neutrophils Relative %: 57 %
Platelet Count: 240 10*3/uL (ref 150–400)
RBC: 4.46 MIL/uL (ref 3.87–5.11)
RDW: 12.9 % (ref 11.5–15.5)
WBC Count: 8.9 10*3/uL (ref 4.0–10.5)
nRBC: 0 % (ref 0.0–0.2)

## 2018-12-17 LAB — RETICULOCYTES
Immature Retic Fract: 12.3 % (ref 2.3–15.9)
RBC.: 4.37 MIL/uL (ref 3.87–5.11)
Retic Count, Absolute: 96.1 10*3/uL (ref 19.0–186.0)
Retic Ct Pct: 2.2 % (ref 0.4–3.1)

## 2018-12-17 NOTE — Progress Notes (Signed)
Hematology and Oncology Follow Up Visit  Sara Holt [id number] 1935-04-23 84 y.o. 12/17/2018   Principle Diagnosis:  Iron deficiency anemia Anemia of renal insufficiency  Current Therapy:   Iv iron as indicated    Interim History:  Sara Holt is here today for follow-up. She has noted some mild fatigue at times.  She is spending time with family and avoiding crowds.  No fever, chills, n/v, cough, rash, dizziness, SOB, chest pain, palpitations, abdominal pain or change sin bowel or bladder habits.  She avoids the food (greasy/fried) that can trigger diarrhea.  No episodes of bleeding, no bruising or petechiae.  No swelling or tenderness in her extremities at this time.  She has numbness and tingling in her hands that she feels may be due to chronic neck issues and possibly carpal tunnel syndrome. Nothing in the feet.  No lymphoadenopathy noted on exam.  She is eating well and staying hydrated. Her weight is stable.   ECOG Performance Status: 1 - Symptomatic but completely ambulatory  Medications:  Allergies as of 12/17/2018      Reactions   Buprenorphine Hcl Nausea And Vomiting   nausea Other reaction(s): Nausea And Vomiting nausea nausea   Morphine And Related Nausea And Vomiting   nausea   Sulfa Antibiotics Nausea And Vomiting   "deathly ill"   Sulfacetamide Sodium Nausea And Vomiting   "deathly ill"   Morphine Nausea And Vomiting   Other reaction(s): Nausea And Vomiting      Medication List       Accurate as of December 17, 2018 11:54 AM. If you have any questions, ask your nurse or doctor.        azelastine 0.1 % nasal spray Commonly known as: ASTELIN U 1 SPR IEN BID PRN   carvedilol 12.5 MG tablet Commonly known as: COREG TAKE 1 TABLET BY MOUTH TWO  TIMES DAILY   cholecalciferol 1000 units tablet Commonly known as: VITAMIN D Take 1,000 Units by mouth daily.   Cymbalta 60 MG capsule Generic drug: DULoxetine Take 1 tablet by mouth daily.    desoximetasone 0.25 % cream Commonly known as: TOPICORT Apply topically continuous as needed (skin allergy).   fluticasone 50 MCG/ACT nasal spray Commonly known as: FLONASE   fluticasone furoate-vilanterol 100-25 MCG/INH Aepb Commonly known as: BREO ELLIPTA Inhale 1 puff into the lungs once.   glimepiride 4 MG tablet Commonly known as: AMARYL Take 4 mg by mouth.   ipratropium-albuterol 0.5-2.5 (3) MG/3ML Soln Commonly known as: DUONEB Take 3 mLs by nebulization every 4 (four) hours as needed.   Janumet XR [phone] MG Tb24 Generic drug: SitaGLIPtin-MetFORMIN HCl Take 1 tablet by mouth daily.   loratadine 10 MG tablet Commonly known as: CLARITIN Take 10 mg by mouth.   losartan-hydrochlorothiazide 50-12.5 MG tablet Commonly known as: HYZAAR Take 1 tablet by mouth daily.   metFORMIN 500 MG 24 hr tablet Commonly known as: GLUCOPHAGE-XR   methocarbamol 500 MG tablet Commonly known as: ROBAXIN Take 500 mg by mouth daily as needed.   omeprazole 40 MG capsule Commonly known as: PRILOSEC Take 40 mg by mouth.   ONE TOUCH ULTRA TEST test strip Generic drug: glucose blood   OVER THE COUNTER MEDICATION Place 1 drop into both eyes at bedtime. dry eyes med   oxybutynin 5 MG tablet Commonly known as: DITROPAN Take 5 mg by mouth daily.   ramipril 10 MG capsule Commonly known as: ALTACE Take 10 mg by mouth daily.   simvastatin 10 MG tablet  Commonly known as: ZOCOR Take 10 mg by mouth Daily.   Voltaren 1 % Gel Generic drug: diclofenac sodium Apply 4 g topically as needed (pain).       Allergies:  Allergies  Allergen Reactions  . Buprenorphine Hcl Nausea And Vomiting    nausea Other reaction(s): Nausea And Vomiting nausea nausea  . Morphine And Related Nausea And Vomiting    nausea  . Sulfa Antibiotics Nausea And Vomiting    "deathly ill"  . Sulfacetamide Sodium Nausea And Vomiting    "deathly ill"  . Morphine Nausea And Vomiting    Other reaction(s):  Nausea And Vomiting    Past Medical History, Surgical history, Social history, and Family History were reviewed and updated.  Review of Systems: All other 10 point review of systems is negative.   Physical Exam:  vitals were not taken for this visit.   Wt Readings from Last 3 Encounters:  09/18/18 164 lb (74.4 kg)  08/17/18 166 lb (75.3 kg)  06/16/17 158 lb (71.7 kg)    Ocular: Sclerae unicteric, pupils equal, round and reactive to light Ear-nose-throat: Oropharynx clear, dentition fair Lymphatic: No cervical or supraclavicular adenopathy Lungs no rales or rhonchi, good excursion bilaterally Heart regular rate and rhythm, no murmur appreciated Abd soft, nontender, positive bowel sounds, no liver or spleen tip palpated on exam, no fluid wave  MSK no focal spinal tenderness, no joint edema Neuro: non-focal, well-oriented, appropriate affect Breasts: Deferred   Lab Results  Component Value Date   WBC 8.9 12/17/2018   HGB 13.7 12/17/2018   HCT 40.9 12/17/2018   MCV 91.7 12/17/2018   PLT 240 12/17/2018   Lab Results  Component Value Date   FERRITIN 13 08/17/2018   IRON 79 08/17/2018   TIBC 409 08/17/2018   UIBC 330 08/17/2018   IRONPCTSAT 19 (L) 08/17/2018   Lab Results  Component Value Date   RETICCTPCT 2.2 12/17/2018   RBC 4.37 12/17/2018   RETICCTABS 63.2 01/12/2015   No results found for: KPAFRELGTCHN, LAMBDASER, KAPLAMBRATIO No results found for: IGGSERUM, IGA, IGMSERUM No results found for: Odetta Pink, SPEI   Chemistry      Component Value Date/Time   NA 141 08/17/2018 1323   NA 141 06/16/2017 1114   NA 141 02/15/2016 0920   K 4.0 08/17/2018 1323   K 4.3 06/16/2017 1114   K 4.4 02/15/2016 0920   CL 102 08/17/2018 1323   CL 104 06/16/2017 1114   CO2 31 08/17/2018 1323   CO2 30 06/16/2017 1114   CO2 26 02/15/2016 0920   BUN 23 08/17/2018 1323   BUN 18 06/16/2017 1114   BUN 25.7 02/15/2016 0920    CREATININE 1.38 (H) 08/17/2018 1323   CREATININE 1.1 06/16/2017 1114   CREATININE 1.1 02/15/2016 0920      Component Value Date/Time   CALCIUM 11.3 (H) 08/17/2018 1323   CALCIUM 10.4 (H) 06/16/2017 1114   CALCIUM 10.4 02/15/2016 0920   ALKPHOS 35 (L) 08/17/2018 1323   ALKPHOS 38 06/16/2017 1114   ALKPHOS 33 (L) 02/15/2016 0920   AST 19 08/17/2018 1323   AST 18 02/15/2016 0920   ALT 13 08/17/2018 1323   ALT 21 06/16/2017 1114   ALT 13 02/15/2016 0920   BILITOT 0.6 08/17/2018 1323   BILITOT 0.60 02/15/2016 0920       Impression and Plan: Sara Holt is a very pleasant 84 yo caucasian female with history of iron deficiency.  We will  see what her iron studies show and bring her back in for infusion if needed.  We will plan to see her back in another 4 months.  She will contact our office with any questions or concerns. We can certainly see her sooner if need be.   Laverna Peace, NP 6/15/202011:54 AM

## 2018-12-18 LAB — IRON AND TIBC
Iron: 94 ug/dL (ref 41–142)
Saturation Ratios: 28 % (ref 21–57)
TIBC: 333 ug/dL (ref 236–444)
UIBC: 238 ug/dL (ref 120–384)

## 2018-12-18 LAB — FERRITIN: Ferritin: 42 ng/mL (ref 11–307)

## 2019-03-19 ENCOUNTER — Other Ambulatory Visit: Payer: Self-pay

## 2019-03-19 ENCOUNTER — Ambulatory Visit: Payer: Medicare Other | Admitting: Family

## 2019-03-19 ENCOUNTER — Encounter (HOSPITAL_COMMUNITY): Payer: Medicare Other

## 2019-03-19 DIAGNOSIS — I6523 Occlusion and stenosis of bilateral carotid arteries: Secondary | ICD-10-CM

## 2019-03-21 ENCOUNTER — Encounter: Payer: Self-pay | Admitting: Family

## 2019-03-21 ENCOUNTER — Ambulatory Visit: Payer: Medicare Other | Admitting: Family

## 2019-03-21 ENCOUNTER — Ambulatory Visit (HOSPITAL_COMMUNITY)
Admission: RE | Admit: 2019-03-21 | Discharge: 2019-03-21 | Disposition: A | Discharge status: Home / Self Care | Payer: Medicare Other | Source: Ambulatory Visit | Attending: Family | Admitting: Family

## 2019-03-21 ENCOUNTER — Other Ambulatory Visit: Payer: Self-pay

## 2019-03-21 VITALS — BP 134/82 | HR 76 | Temp 97.8°F | Resp 14 | Ht 64.0 in | Wt 163.0 lb

## 2019-03-21 DIAGNOSIS — I6523 Occlusion and stenosis of bilateral carotid arteries: Secondary | ICD-10-CM

## 2019-03-21 DIAGNOSIS — Z9889 Other specified postprocedural states: Secondary | ICD-10-CM | POA: Diagnosis not present

## 2019-03-21 NOTE — Progress Notes (Signed)
Chief Complaint: Follow up Extracranial Carotid Artery Stenosis   History of Present Illness  Sara MAZMANIAN is a 84 y.o. female who is status post left CEA in January 2012by Dr. Donnetta Hutching. She returns today for follow up. She had amaurosis in her left eye prior to the left CEA, no subsequent neurological events.   She has seen a neurologist and a cardiologist, then a hematologist for anemia. She was given iron infusions and feels much improved; has not had any more syncope or pre-syncope since the iron infusions in 2016. 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   Dr. Donnetta Hutching saw pt on 03/11/14 and did not feel that her syncope was related to her carotid artery stenosis at that time. In August 2015 she had a very brief syncopal episode after standing up and walking for a while. In 2012, just before the left CEA, she had transient left monocular blindness, denies unilateral facial drooping, denies hemiparesis, denies aphasia symptoms. Saw her opthalmologist after this.  She reports "bone on bone" of her right knee, states the left knee has been replaced. Her walking is limited by right knee pain. She has arthritis, including her right knee.  She states her last stress test was a few years ago to evaluate left arm and neck pain, pt states was normal, states c-spine vertabra issue found.   She states that she has had bilateral carpal tunnel surgery, and both hand are still weak.   She states that she needs cataract surgery, and possibly right knee replacement, had her left knee replaced.    Diabetic: yes, states her last A1C was 6.6 Tobacco use: none  Pt meds include: Statin : yes Beta blocker: yes ASA: no, she stopped taking, states she thought acetaminophen that she takes would replace it, I advised  her to resume 81 mg daily, she states she forgot to resume Other anticoagulants/antiplatelets: no   Past Medical History:  Diagnosis Date  . Allergy   . Anemia   . Arthritis    OA  . Asthma   . Carotid artery occlusion   . Diabetes mellitus    DIET CONTROLLED, NO MEDS  . Disc    problems in neck and back  . GERD (gastroesophageal reflux disease)   . Hyperlipidemia   . Hypertension   . Neuromuscular disorder ([place])    nerve problems with hands  . Osteoporosis   . Sleep apnea     Social History Social History   Tobacco Use  . Smoking status: Never Smoker  . Smokeless tobacco: Never Used  Substance Use Topics  . Alcohol use: Yes    Alcohol/week: 7.0 standard drinks    Types: 7 Glasses of wine per week  . Drug use: No    Family History Family History  Problem Relation Age of Onset  . Diabetes Father   . Pneumonia Father   . Peripheral vascular disease Sister   . Colon cancer Maternal Grandmother   . Cancer Maternal Grandmother        Colon cancer  . Esophageal cancer Neg Hx   . Stomach cancer Neg Hx   . Rectal cancer Neg Hx     Surgical History Past Surgical History:  Procedure Laterality Date  . CAROTID ARTERY ANGIOPLASTY    . CAROTID ENDARTERECTOMY  Jan. 16,2012  LEFT cea  . carpel tunnel     both hands  . COLONOSCOPY    . JOINT REPLACEMENT Left 2011   Knee  . KNEE ARTHROSCOPY Left 2010  . POLYPECTOMY      Allergies  Allergen Reactions  . Buprenorphine Hcl Nausea And Vomiting  . Morphine Nausea And Vomiting  . Sulfacetamide Sodium Nausea And Vomiting    Current Outpatient Medications  Medication Sig Dispense Refill  . azelastine (ASTELIN) 0.1 % nasal spray U 1 SPR IEN BID PRN  5  . carvedilol (COREG) 12.5 MG tablet TAKE 1 TABLET BY MOUTH TWO  TIMES DAILY    . cholecalciferol (VITAMIN D) 1000 units tablet Take 1,000 Units by mouth daily.    . CYMBALTA 60 MG capsule Take 1 tablet by mouth daily.     Marland Kitchen desoximetasone (TOPICORT) 0.25 % cream  Apply topically continuous as needed (skin allergy).     Marland Kitchen diclofenac sodium (VOLTAREN) 1 % GEL Apply 4 g topically as needed (pain).     . fluticasone (FLONASE) 50 MCG/ACT nasal spray     . fluticasone furoate-vilanterol (BREO ELLIPTA) 100-25 MCG/INH AEPB Inhale 1 puff into the lungs once.    Marland Kitchen glimepiride (AMARYL) 4 MG tablet Take 4 mg by mouth.    . loratadine (CLARITIN) 10 MG tablet Take 10 mg by mouth.    . losartan-hydrochlorothiazide (HYZAAR) 50-12.5 MG tablet Take 1 tablet by mouth daily.    . metFORMIN (GLUCOPHAGE-XR) 500 MG 24 hr tablet Take 1 mg by mouth daily with breakfast.     . methocarbamol (ROBAXIN) 500 MG tablet Take 500 mg by mouth daily as needed.    Marland Kitchen omeprazole (PRILOSEC) 40 MG capsule Take 40 mg by mouth.    . ONE TOUCH ULTRA TEST test strip   0  . OVER THE COUNTER MEDICATION Place 1 drop into both eyes at bedtime. dry eyes med    . oxybutynin (DITROPAN) 5 MG tablet Take 5 mg by mouth daily.    . simvastatin (ZOCOR) 10 MG tablet Take 10 mg by mouth Daily.      No current facility-administered medications for this visit.     Review of Systems : See HPI for pertinent positives and negatives.  Physical Examination  Vitals:   03/21/19 1036  BP: 134/82  Pulse: 76  Resp: 14  Temp: 97.8 F (36.6 C)  TempSrc: Temporal  SpO2: 98%  Weight: 163 lb (73.9 kg)  Height: 5\' 4"  (1.626 m)   Body mass index is 27.98 kg/m.  General: WDWN female in NAD GAIT: normal Eyes: PERRLA HENT: No gross abnormalities.  Pulmonary:  Respirations are non-labored, good air movement in all fields, CTAB, no rales, rhonchi, or  wheezes Cardiac: Irregular rhythm with controlled rate, no detected murmur.  VASCULAR EXAM Carotid Bruits Right Left   Negative Negative     Abdominal aortic pulse is not palpable. Radial pulses are 1+ palpable and equal.  LE Pulses Right  Left       POPLITEAL  not palpable   not palpable       POSTERIOR TIBIAL  faintly palpable   faintly palpable        DORSALIS PEDIS      ANTERIOR TIBIAL not palpable  not palpable    Gastrointestinal: soft, nontender, BS WNL, no r/g, no palpable masses. Musculoskeletal: no muscle atrophy/wasting. M/S 4/5 throughout, extremities without ischemic changes Skin: No rashes, no ulcers, no cellulitis.   Neurologic:  A&O X 3; appropriate affect, sensation is normal; speech is normal, CN 2-12 intact, pain and light touch intact in extremities, motor exam as listed above. Psychiatric: Normal thought content, mood appropriate to clinical situation.    DATA  Carotid Duplex (03-21-19): Right Carotid: Velocities in the right ICA are consistent with a 60-79%                stenosis. Non-hemodynamically significant plaque <50% noted in                the CCA. The ECA appears >50% stenosed. Left Carotid: Velocities in the left ICA are consistent with a 1-39% stenosis.               Patent endarterectomy. Non-hemodynamically significant plaque <50%               noted in the CCA. The ECA appears >50% stenosed. Vertebrals:  Bilateral vertebral arteries demonstrate antegrade flow. Subclavians: Normal flow hemodynamics were seen in bilateral subclavian arteries. No change compared to the exam on 09-18-18.    Assessment: LASHONNA Holt is a 84 y.o. female who is status post left CEA in January 2012. She had amaurosis of her left eye before her left CEA, no neurological events since then.   It appears that the syncope and presyncope she was having was due to severe iron deficiency anemia. Since this was treated she has felt much improved with no further syncope or presyncope.  Her cardiac rhythm is irregular on exam, pt denies any known hx of irregular heart beat, states she has known right BBB, is not taking any anticoagulants. She is asymptomatic of this however, denies feeling light headed,  denies dyspnea or chest pain, denies feeling any palpitations.  She states that her PCP is aware of this.  I advised her to seek advice from her PCP re her asymptomatic irregular heartbeat.   The last ECG result on file was from July 2018: Sinus rhythm with marked sinus arrhythmia with occasional Premature ventricular complexesand Premature atrial complexes  Right bundle branch block  When compared with ECG of 29-Nov-2016 11:30,Right bundle branch block has replaced Nonspecific intraventricular block.  Confirmed by HAISTY, Elaine. ([id number]) on 01/09/2017 3:44:09 PM   I also advised her to resume the daily 81 mg ASA. She denies any bleeding issues, denies allergy to ASA.    Plan: Follow-up in 6 months with Carotid Duplex scan.   I discussed in depth with the patient the nature of atherosclerosis, and emphasized the importance of maximal medical management including strict control of blood pressure, blood glucose, and lipid levels, obtaining regular exercise, and continued cessation of smoking.  The patient is aware that without maximal medical management the underlying atherosclerotic disease process will progress, limiting the benefit of any interventions. The patient was given information about stroke prevention and what symptoms should prompt the patient to seek immediate medical care. Thank you for allowing [place] to participate in this  patient's care.  Clemon Chambers, RN, MSN, FNP-C [facility] Office: [phone]  Clinic Physician: Laqueta Due  03/21/19 11:08 AM

## 2019-03-21 NOTE — Patient Instructions (Addendum)
Stroke Prevention Some medical conditions and lifestyle choices can lead to a higher risk for a stroke. You can help to prevent a stroke by making nutrition, lifestyle, and other changes. What nutrition changes can be made?   Eat healthy foods. ? Choose foods that are high in fiber. These include:  Fresh fruits.  Fresh vegetables.  Whole grains. ? Eat at least 5 or more servings of fruits and vegetables each day. Try to fill half of your plate at each meal with fruits and vegetables. ? Choose lean protein foods. These include:  Lowfat (lean) cuts of meat.  Chicken without skin.  Fish.  Tofu.  Beans.  Nuts. ? Eat low-fat dairy products. ? Avoid foods that:  Are high in salt (sodium).  Have saturated fat.  Have trans fat.  Have cholesterol.  Are processed.  Are premade.  Follow eating guidelines as told by your doctor. These may include: ? Reducing how many calories you eat and drink each day. ? Limiting how much salt you eat or drink each day to 1,500 milligrams (mg). ? Using only healthy fats for cooking. These include:  Olive oil.  Canola oil.  Sunflower oil. ? Counting how many carbohydrates you eat and drink each day. What lifestyle changes can be made?  Try to stay at a healthy weight. Talk to your doctor about what a good weight is for you.  Get at least 30 minutes of moderate physical activity at least 5 days a week. This can include: ? Fast walking. ? Biking. ? Swimming.  Do not use any products that have nicotine or tobacco. This includes cigarettes and e-cigarettes. If you need help quitting, ask your doctor. Avoid being around tobacco smoke in general.  Limit how much alcohol you drink to no more than 1 drink a day for nonpregnant women and 2 drinks a day for men. One drink equals 12 oz of beer, 5 oz of wine, or 1 oz of hard liquor.  Do not use drugs.  Avoid taking birth control pills. Talk to your doctor about the risks of taking birth  control pills if: ? You are over 35 years old. ? You smoke. ? You get migraines. ? You have had a blood clot. What other changes can be made?  Manage your cholesterol. ? It is important to eat a healthy diet. ? If your cholesterol cannot be managed through your diet, you may also need to take medicines. Take medicines as told by your doctor.  Manage your diabetes. ? It is important to eat a healthy diet and to exercise regularly. ? If your blood sugar cannot be managed through diet and exercise, you may need to take medicines. Take medicines as told by your doctor.  Control your high blood pressure (hypertension). ? Try to keep your blood pressure below 130/80. This can help lower your risk of stroke. ? It is important to eat a healthy diet and to exercise regularly. ? If your blood pressure cannot be managed through diet and exercise, you may need to take medicines. Take medicines as told by your doctor. ? Ask your doctor if you should check your blood pressure at home. ? Have your blood pressure checked every year. Do this even if your blood pressure is normal.  Talk to your doctor about getting checked for a sleep disorder. Signs of this can include: ? Snoring a lot. ? Feeling very tired.  Take over-the-counter and prescription medicines only as told by your doctor. These may   include aspirin or blood thinners (antiplatelets or anticoagulants).  Make sure that any other medical conditions you have are managed. Where to find more information  American Stroke Association: www.strokeassociation.org  National Stroke Association: www.stroke.org Get help right away if:  You have any symptoms of stroke. "BE FAST" is an easy way to remember the main warning signs: ? B - Balance. Signs are dizziness, sudden trouble walking, or loss of balance. ? E - Eyes. Signs are trouble seeing or a sudden change in how you see. ? F - Face. Signs are sudden weakness or loss of feeling of the face,  or the face or eyelid drooping on one side. ? A - Arms. Signs are weakness or loss of feeling in an arm. This happens suddenly and usually on one side of the body. ? S - Speech. Signs are sudden trouble speaking, slurred speech, or trouble understanding what people say. ? T - Time. Time to call emergency services. Write down what time symptoms started.  You have other signs of stroke, such as: ? A sudden, very bad headache with no known cause. ? Feeling sick to your stomach (nausea). ? Throwing up (vomiting). ? Jerky movements you cannot control (seizure). These symptoms may represent a serious problem that is an emergency. Do not wait to see if the symptoms will go away. Get medical help right away. Call your local emergency services (911 in the [place] Do not drive yourself to the hospital. Summary  You can prevent a stroke by eating healthy, exercising, not smoking, drinking less alcohol, and treating other health problems, such as diabetes, high blood pressure, or high cholesterol.  Do not use any products that contain nicotine or tobacco, such as cigarettes and e-cigarettes.  Get help right away if you have any signs or symptoms of a stroke. This information is not intended to replace advice given to you by your health care provider. Make sure you discuss any questions you have with your health care provider. Document Released: 12/20/2011 Document Revised: 08/16/2018 Document Reviewed: 09/21/2016 Elsevier Patient Education  [employer].    Aspirin and Your Heart  Aspirin is a medicine that prevents the cells in the blood that are used for clotting, called platelets, from sticking together. Aspirin can be used to help reduce the risk of blood clots, heart attacks, and other heart-related problems. Can I take aspirin? Your health care provider will help you determine whether it is safe and beneficial for you to take aspirin daily. Taking aspirin daily may be helpful if you:   Have had a heart attack or chest pain.  Are at risk for a heart attack.  Have undergone open-heart surgery, such as coronary artery bypass surgery (CABG).  Have had coronary angioplasty or a stent.  Have had certain types of stroke or transient ischemic attack (TIA).  Have peripheral artery disease (PAD).  Have chronic heart rhythm problems such as atrial fibrillation and cannot take an anticoagulant.  Have valve disease or have had surgery on a valve. What are the risks? Daily use of aspirin can cause side effects. Some of these include:  Bleeding. Bleeding problems can be minor or serious. An example of a minor problem is a cut that does not stop bleeding. An example of a more serious problem is stomach bleeding or, rarely, bleeding into the brain. Your risk of bleeding is increased if you are also taking non-steroidal anti-inflammatory drugs (NSAIDs).  Increased bruising.  Upset stomach.  An allergic reaction. People who  have nasal polyps have an increased risk of developing an aspirin allergy. General guidelines  Take aspirin only as told by your health care provider. Make sure that you understand how much you should take and what form you should take. The two forms of aspirin are: ? Non-enteric-coated.This type of aspirin does not have a coating and is absorbed quickly. This type of aspirin also comes in a chewable form. ? Enteric-coated. This type of aspirin has a coating that releases the medicine very slowly. Enteric-coated aspirin might cause less stomach upset than non-enteric-coated aspirin. This type of aspirin should not be chewed or crushed.  Limit alcohol intake to no more than 1 drink a day for nonpregnant women and 2 drinks a day for men. Drinking alcohol increases your risk of bleeding. One drink equals 12 oz of beer, 5 oz of wine, or 1 oz of hard liquor. Contact a health care provider if you:  Have unusual bleeding or bruising.  Have stomach pain or nausea.   Have ringing in your ears.  Have an allergic reaction that causes: ? Hives. ? Itchy skin. ? Swelling of the lips, tongue, or face. Get help right away if you:  Notice that your bowel movements are bloody, dark red, or black in color.  Vomit or cough up blood.  Have blood in your urine.  Cough, have noisy breathing (wheeze), or feel short of breath.  Have chest pain, especially if the pain spreads to the arms, back, neck, or jaw.  Have a severe headache, or a headache with confusion, or dizziness. These symptoms may represent a serious problem that is an emergency. Do not wait to see if the symptoms will go away. Get medical help right away. Call your local emergency services (911 in the [place] Do not drive yourself to the hospital. Summary  Aspirin can be used to help reduce the risk of blood clots, heart attacks, and other heart-related problems.  Daily use of aspirin can increase your risk of side effects. Your health care provider will help you determine whether it is safe and beneficial for you to take aspirin daily.  Take aspirin only as told by your health care provider. Make sure that you understand how much you can take and what form you can take. This information is not intended to replace advice given to you by your health care provider. Make sure you discuss any questions you have with your health care provider. Document Released: 06/02/2008 Document Revised: 04/20/2017 Document Reviewed: 04/20/2017 Elsevier Patient Education  2020 [employer].

## 2019-04-15 ENCOUNTER — Telehealth: Payer: Self-pay | Admitting: Family

## 2019-04-15 ENCOUNTER — Inpatient Hospital Stay: Payer: Medicare Other

## 2019-04-15 ENCOUNTER — Encounter: Payer: Self-pay | Admitting: Family

## 2019-04-15 ENCOUNTER — Other Ambulatory Visit: Payer: Self-pay

## 2019-04-15 ENCOUNTER — Inpatient Hospital Stay: Payer: Medicare Other | Attending: Family | Admitting: Family

## 2019-04-15 VITALS — BP 161/73 | HR 70 | Temp 97.1°F | Resp 18

## 2019-04-15 DIAGNOSIS — D500 Iron deficiency anemia secondary to blood loss (chronic): Secondary | ICD-10-CM

## 2019-04-15 DIAGNOSIS — D509 Iron deficiency anemia, unspecified: Secondary | ICD-10-CM | POA: Diagnosis not present

## 2019-04-15 LAB — CBC WITH DIFFERENTIAL (CANCER CENTER ONLY)
Abs Immature Granulocytes: 0.09 10*3/uL — ABNORMAL HIGH (ref 0.00–0.07)
Basophils Absolute: 0.1 10*3/uL (ref 0.0–0.1)
Basophils Relative: 1 %
Eosinophils Absolute: 0.4 10*3/uL (ref 0.0–0.5)
Eosinophils Relative: 5 %
HCT: 39.8 % (ref 36.0–46.0)
Hemoglobin: 13.2 g/dL (ref 12.0–15.0)
Immature Granulocytes: 1 %
Lymphocytes Relative: 31 %
Lymphs Abs: 2.3 10*3/uL (ref 0.7–4.0)
MCH: 29.9 pg (ref 26.0–34.0)
MCHC: 33.2 g/dL (ref 30.0–36.0)
MCV: 90.2 fL (ref 80.0–100.0)
Monocytes Absolute: 0.5 10*3/uL (ref 0.1–1.0)
Monocytes Relative: 6 %
Neutro Abs: 4.1 10*3/uL (ref 1.7–7.7)
Neutrophils Relative %: 56 %
Platelet Count: 231 10*3/uL (ref 150–400)
RBC: 4.41 MIL/uL (ref 3.87–5.11)
RDW: 12.5 % (ref 11.5–15.5)
WBC Count: 7.3 10*3/uL (ref 4.0–10.5)
nRBC: 0 % (ref 0.0–0.2)

## 2019-04-15 LAB — RETICULOCYTES
Immature Retic Fract: 13 % (ref 2.3–15.9)
RBC.: 4.32 MIL/uL (ref 3.87–5.11)
Retic Count, Absolute: 90.3 10*3/uL (ref 19.0–186.0)
Retic Ct Pct: 2.1 % (ref 0.4–3.1)

## 2019-04-15 NOTE — Telephone Encounter (Signed)
Call [facility] for patient w/ date/time of next appointment per 10/12 los

## 2019-04-15 NOTE — Progress Notes (Signed)
Hematology and Oncology Follow Up Visit  Sara Holt [id number] 1934-07-23 84 y.o. 04/15/2019   Principle Diagnosis:  Iron deficiency anemia Anemia of renal insufficiency  Current Therapy:   IV iron as indicated    Interim History:  Sara Holt is here today for follow-up. She is doing well and denies fatigue.  She has had no bleeding, bruising or petechiae. Hgb stable at 13.2, MCV 90.2.  She has chronic dry hands and a red round dry patch on her left pointer finger. She has an appointment with dermatology for further work up in January.  No fever, chills, n/v, cough, dizziness, SOB, chest pain, palpitations, abdominal pain or changes in bowel or bladder habits.  She stopped drinking coffee and switched to tea and this has resolved her diarrhea issue.  No swelling or tenderness in her extremities.  She has numbness and tingling in her hands possibly due to chronic neck issues.  No falls or syncopal episodes to report.  She has maintained a good appetite and is staying well hydrated. Her weight is stable.   ECOG Performance Status: 0 - Asymptomatic  Medications:  Allergies as of 04/15/2019      Reactions   Buprenorphine Hcl Nausea And Vomiting   Morphine Nausea And Vomiting   Sulfacetamide Sodium Nausea And Vomiting      Medication List       Accurate as of April 15, 2019 11:45 AM. If you have any questions, ask your nurse or doctor.        azelastine 0.1 % nasal spray Commonly known as: ASTELIN U 1 SPR IEN BID PRN   carvedilol 12.5 MG tablet Commonly known as: COREG TAKE 1 TABLET BY MOUTH TWO  TIMES DAILY   cholecalciferol 1000 units tablet Commonly known as: VITAMIN D Take 1,000 Units by mouth daily.   Cymbalta 60 MG capsule Generic drug: DULoxetine Take 1 tablet by mouth daily.   desoximetasone 0.25 % cream Commonly known as: TOPICORT Apply topically continuous as needed (skin allergy).   fluticasone 50 MCG/ACT nasal spray Commonly known as:  FLONASE   fluticasone furoate-vilanterol 100-25 MCG/INH Aepb Commonly known as: BREO ELLIPTA Inhale 1 puff into the lungs once.   glimepiride 4 MG tablet Commonly known as: AMARYL Take 4 mg by mouth.   loratadine 10 MG tablet Commonly known as: CLARITIN Take 10 mg by mouth.   losartan-hydrochlorothiazide 50-12.5 MG tablet Commonly known as: HYZAAR Take 1 tablet by mouth daily.   metFORMIN 500 MG 24 hr tablet Commonly known as: GLUCOPHAGE-XR Take 1 mg by mouth daily with breakfast.   methocarbamol 500 MG tablet Commonly known as: ROBAXIN Take 500 mg by mouth daily as needed.   omeprazole 40 MG capsule Commonly known as: PRILOSEC Take 40 mg by mouth.   ONE TOUCH ULTRA TEST test strip Generic drug: glucose blood   OVER THE COUNTER MEDICATION Place 1 drop into both eyes at bedtime. dry eyes med   oxybutynin 5 MG tablet Commonly known as: DITROPAN Take 5 mg by mouth daily.   simvastatin 10 MG tablet Commonly known as: ZOCOR Take 10 mg by mouth Daily.   Voltaren 1 % Gel Generic drug: diclofenac sodium Apply 4 g topically as needed (pain).       Allergies:  Allergies  Allergen Reactions  . Buprenorphine Hcl Nausea And Vomiting  . Morphine Nausea And Vomiting  . Sulfacetamide Sodium Nausea And Vomiting    Past Medical History, Surgical history, Social history, and Family History were reviewed  and updated.  Review of Systems: All other 10 point review of systems is negative.   Physical Exam:  vitals were not taken for this visit.   Wt Readings from Last 3 Encounters:  03/21/19 163 lb (73.9 kg)  12/17/18 164 lb 12.8 oz (74.8 kg)  09/18/18 164 lb (74.4 kg)    Ocular: Sclerae unicteric, pupils equal, round and reactive to light Ear-nose-throat: Oropharynx clear, dentition fair Lymphatic: No cervical or supraclavicular adenopathy Lungs no rales or rhonchi, good excursion bilaterally Heart regular rate and rhythm, no murmur appreciated Abd soft,  nontender, positive bowel sounds, no liver or spleen tip palpated on exam, no fluid wave  MSK no focal spinal tenderness, no joint edema Neuro: non-focal, well-oriented, appropriate affect Breasts: Deferred   Lab Results  Component Value Date   WBC 7.3 04/15/2019   HGB 13.2 04/15/2019   HCT 39.8 04/15/2019   MCV 90.2 04/15/2019   PLT 231 04/15/2019   Lab Results  Component Value Date   FERRITIN 42 12/17/2018   IRON 94 12/17/2018   TIBC 333 12/17/2018   UIBC 238 12/17/2018   IRONPCTSAT 28 12/17/2018   Lab Results  Component Value Date   RETICCTPCT 2.1 04/15/2019   RBC 4.41 04/15/2019   RETICCTABS 63.2 01/12/2015   No results found for: KPAFRELGTCHN, LAMBDASER, KAPLAMBRATIO No results found for: IGGSERUM, IGA, IGMSERUM No results found for: Kathrynn Ducking, MSPIKE, SPEI   Chemistry      Component Value Date/Time   NA 141 08/17/2018 1323   NA 141 06/16/2017 1114   NA 141 02/15/2016 0920   K 4.0 08/17/2018 1323   K 4.3 06/16/2017 1114   K 4.4 02/15/2016 0920   CL 102 08/17/2018 1323   CL 104 06/16/2017 1114   CO2 31 08/17/2018 1323   CO2 30 06/16/2017 1114   CO2 26 02/15/2016 0920   BUN 23 08/17/2018 1323   BUN 18 06/16/2017 1114   BUN 25.7 02/15/2016 0920   CREATININE 1.38 (H) 08/17/2018 1323   CREATININE 1.1 06/16/2017 1114   CREATININE 1.1 02/15/2016 0920      Component Value Date/Time   CALCIUM 11.3 (H) 08/17/2018 1323   CALCIUM 10.4 (H) 06/16/2017 1114   CALCIUM 10.4 02/15/2016 0920   ALKPHOS 35 (L) 08/17/2018 1323   ALKPHOS 38 06/16/2017 1114   ALKPHOS 33 (L) 02/15/2016 0920   AST 19 08/17/2018 1323   AST 18 02/15/2016 0920   ALT 13 08/17/2018 1323   ALT 21 06/16/2017 1114   ALT 13 02/15/2016 0920   BILITOT 0.6 08/17/2018 1323   BILITOT 0.60 02/15/2016 0920       Impression and Plan: Sara Holt is a very pleasant 84 yo caucasian female with history of iron deficiency.  We will see what her iron  studies show and replace if needed.  We will go ahead and plan to see her back in another 6 months.  She will contact our office with any questions or concerns. We can certainly see her sooner if needed.   Laverna Peace, NP 10/12/202011:45 AM

## 2019-04-16 LAB — IRON AND TIBC
Iron: 85 ug/dL (ref 41–142)
Saturation Ratios: 24 % (ref 21–57)
TIBC: 351 ug/dL (ref 236–444)
UIBC: 266 ug/dL (ref 120–384)

## 2019-04-16 LAB — FERRITIN: Ferritin: 20 ng/mL (ref 11–307)

## 2019-06-11 ENCOUNTER — Telehealth: Payer: Self-pay | Admitting: Neurology

## 2019-06-11 NOTE — Telephone Encounter (Signed)
Okay with me for the switch.

## 2019-06-11 NOTE — Telephone Encounter (Signed)
Ok with me 

## 2019-06-11 NOTE — Telephone Encounter (Signed)
Pt has previously seen Dr. Felecia Shelling and is being referred back to [place] for Hand weakness and Numbness. Pt is requesting to see Dr. Jannifer Franklin. Can she switch?

## 2019-06-13 ENCOUNTER — Other Ambulatory Visit: Payer: Self-pay

## 2019-06-13 ENCOUNTER — Encounter: Payer: Self-pay | Admitting: Neurology

## 2019-06-13 ENCOUNTER — Ambulatory Visit: Payer: Medicare Other | Admitting: Neurology

## 2019-06-13 VITALS — BP 170/94 | HR 52 | Temp 97.4°F | Ht 64.0 in | Wt 162.0 lb

## 2019-06-13 DIAGNOSIS — G5603 Carpal tunnel syndrome, bilateral upper limbs: Secondary | ICD-10-CM | POA: Diagnosis not present

## 2019-06-13 NOTE — Progress Notes (Signed)
Reason for visit: Hand numbness  Referring physician: Dr. Velna Holt is a 84 y.o. female  History of present illness:  Sara Holt is an 84 year old right-handed white female with a history of carpal tunnel syndrome, status post surgery bilaterally 15 years ago.  The patient has a history of diabetes.  She did well until about 2 years ago when the numbness in the hands returned and has persisted.  The patient feels that she is weak in the hands, she may drop things on occasion.  She feels numbness in all digits of both hands.  She does have some chronic neck pain, occasionally she may have some pain radiating down both arms to the hands, but this is not a persistent problem for her.  The patient denies any numbness in the feet, she denies any balance issues or falls.  She does report some trouble controlling both the bowel and bladder function.  She denies any headaches coming up from the neck.  She does have degenerative arthritis affecting the right knee.  She is sent to this office for further evaluation of the hand numbness.  Past Medical History:  Diagnosis Date  . Allergy   . Anemia   . Arthritis    OA  . Asthma   . Carotid artery occlusion   . Diabetes mellitus    DIET CONTROLLED, NO MEDS  . Disc    problems in neck and back  . GERD (gastroesophageal reflux disease)   . Hyperlipidemia   . Hypertension   . Neuromuscular disorder ([place])    nerve problems with hands  . Osteoporosis   . Sleep apnea     Past Surgical History:  Procedure Laterality Date  . CAROTID ARTERY ANGIOPLASTY    . CAROTID ENDARTERECTOMY  Jan. 16,2012   LEFT cea  . carpel tunnel     both hands  . COLONOSCOPY    . JOINT REPLACEMENT Left 2011   Knee  . KNEE ARTHROSCOPY Left 2010  . POLYPECTOMY      Family History  Problem Relation Age of Onset  . Diabetes Father   . Pneumonia Father   . Peripheral vascular disease Sister   . Colon cancer Maternal Grandmother   . Cancer  Maternal Grandmother        Colon cancer  . Esophageal cancer Neg Hx   . Stomach cancer Neg Hx   . Rectal cancer Neg Hx     Social history:  reports that she has never smoked. She has never used smokeless tobacco. She reports current alcohol use of about 7.0 standard drinks of alcohol per week. She reports that she does not use drugs.  Medications:  Prior to Admission medications   Medication Sig Start Date End Date Taking? Authorizing Provider  acetaminophen (TYLENOL) 325 MG tablet Take 650 mg by mouth every 6 (six) hours as needed.   Yes [provider]  aspirin EC 81 MG tablet Take 81 mg by mouth daily.   Yes [provider]  azelastine (ASTELIN) 0.1 % nasal spray U 1 SPR IEN BID PRN 02/14/17  Yes [provider]  carvedilol (COREG) 12.5 MG tablet TAKE 1 TABLET BY MOUTH TWO  TIMES DAILY 09/14/16  Yes [provider]  cholecalciferol (VITAMIN D) 1000 units tablet Take 1,000 Units by mouth daily.   Yes [provider]  CYMBALTA 60 MG capsule Take 1 tablet by mouth daily.  08/22/11  Yes [provider]  desoximetasone (TOPICORT) 0.25 %  cream Apply topically continuous as needed (skin allergy).  01/13/12  Yes [provider]  diclofenac sodium (VOLTAREN) 1 % GEL Apply 4 g topically as needed (pain).    Yes [provider]  fluticasone (FLONASE) 50 MCG/ACT nasal spray  02/18/17  Yes [provider]  fluticasone furoate-vilanterol (BREO ELLIPTA) 100-25 MCG/INH AEPB Inhale 1 puff into the lungs once.   Yes [provider]  glimepiride (AMARYL) 4 MG tablet Take 4 mg by mouth.   Yes [provider]  glucose blood test strip  10/23/17  Yes [provider]  loratadine (CLARITIN) 10 MG tablet Take 10 mg by mouth. 11/29/16  Yes [provider]  losartan-hydrochlorothiazide (HYZAAR) 50-12.5 MG tablet Take 1 tablet by mouth daily. 07/05/18  Yes [provider]  metFORMIN (GLUCOPHAGE-XR)  500 MG 24 hr tablet Take 1 mg by mouth daily with breakfast.  05/01/17  Yes [provider]  methocarbamol (ROBAXIN) 500 MG tablet Take 500 mg by mouth daily as needed. 07/25/18  Yes [provider]  montelukast (SINGULAIR) 10 MG tablet  03/19/19  Yes [provider]  omeprazole (PRILOSEC) 40 MG capsule Take 40 mg by mouth.   Yes [provider]  ONE TOUCH ULTRA TEST test strip  06/18/14  Yes [provider]  OVER THE COUNTER MEDICATION Place 1 drop into both eyes at bedtime. dry eyes med   Yes [provider]  oxybutynin (DITROPAN) 5 MG tablet Take 5 mg by mouth daily. 06/14/13  Yes [provider]  PROLENSA 0.07 % SOLN PLACE 1 GTT OD ONCE DAILY. BEGIN 1 DAY BEFORE SURGERY 02/19/19  Yes [provider]  simvastatin (ZOCOR) 10 MG tablet Take 10 mg by mouth Daily.  06/06/11  Yes [provider]      Allergies  Allergen Reactions  . Buprenorphine Hcl Nausea And Vomiting  . Morphine Nausea And Vomiting  . Sulfacetamide Sodium Nausea And Vomiting    ROS:  Out of a complete 14 system review of symptoms, the patient complains only of the following symptoms, and all other reviewed systems are negative.  Numbness Arthritis pain Neck discomfort  Blood pressure (!) 170/94, pulse (!) 52, temperature (!) 97.4 F (36.3 C), temperature source Temporal, height 5\' 4"  (1.626 m), weight 162 lb (73.5 kg), SpO2 97 %.  Physical Exam  General: The patient is alert and cooperative at the time of the examination.  Eyes: Pupils are equal, round, and reactive to light. Discs are flat bilaterally.  Neck: The neck is supple, bilateral carotid bruits are noted.  Respiratory: The respiratory examination is clear.  Cardiovascular: The cardiovascular examination reveals a regular rate and rhythm, no obvious murmurs or rubs are noted.  Skin: Extremities are without significant edema.  Neurologic Exam  Mental status: The patient  is alert and oriented x 3 at the time of the examination. The patient has apparent normal recent and remote memory, with an apparently normal attention span and concentration ability.  Cranial nerves: Facial symmetry is present. There is good sensation of the face to pinprick and soft touch bilaterally. The strength of the facial muscles and the muscles to head turning and shoulder shrug are normal bilaterally. Speech is well enunciated, no aphasia or dysarthria is noted. Extraocular movements are full. Visual fields are full. The tongue is midline, and the patient has symmetric elevation of the soft palate. No obvious hearing deficits are noted.  Motor: The motor testing reveals 5 over 5 strength of all 4  extremities, with exception of significant weakness of the APB muscles bilaterally in the hands. Good symmetric motor tone is noted throughout.  Sensory: Sensory testing is intact to pinprick, soft touch, vibration sensation, and position sense on the upper extremities.  With the lower extremities, the patient appears to have a stocking pattern pinprick sensory deficit across the ankles bilaterally with some impairment of position sense in both feet.  Vibration sensation is symmetric.  No evidence of extinction is noted.  Coordination: Cerebellar testing reveals good finger-nose-finger and heel-to-shin bilaterally.  Gait and station: Gait is normal. Tandem gait is unsteady. Romberg is negative, but is slightly unsteady. No drift is seen.  Reflexes: Deep tendon reflexes are symmetric, but are depressed bilaterally. Toes are downgoing bilaterally.   Assessment/Plan:  1.  Bilateral hand numbness, carpal tunnel syndrome  The patient likely does have bilateral carpal tunnel syndrome that appears to be relatively severe with wasting of the APB muscles bilaterally, right greater than left.  The carpal tunnel on the right hand may be end-stage.  The patient may also have an underlying diabetic  peripheral neuropathy.  We will check nerve conductions of one leg and both arms, EMG on one arm.  The patient does have bilateral carotid bruits but has known cerebrovascular disease, prior left carotid endarterectomy, she is followed regularly with carotid Doppler studies.  Jill Alexanders MD 06/13/2019 8:34 AM  [facility] [address] [place], [place] [address]  Phone [phone] Fax [phone]

## 2019-08-07 ENCOUNTER — Encounter: Payer: Medicare Other | Admitting: Neurology

## 2019-09-16 ENCOUNTER — Encounter: Payer: Medicare Other | Admitting: Neurology

## 2019-09-20 ENCOUNTER — Other Ambulatory Visit: Payer: Self-pay | Admitting: *Deleted

## 2019-09-20 DIAGNOSIS — I6523 Occlusion and stenosis of bilateral carotid arteries: Secondary | ICD-10-CM

## 2019-09-23 ENCOUNTER — Ambulatory Visit (HOSPITAL_COMMUNITY)
Admission: RE | Admit: 2019-09-23 | Discharge: 2019-09-23 | Disposition: A | Discharge status: Home / Self Care | Payer: Medicare Other | Source: Ambulatory Visit | Attending: Surgery | Admitting: Surgery

## 2019-09-23 DIAGNOSIS — I6523 Occlusion and stenosis of bilateral carotid arteries: Secondary | ICD-10-CM | POA: Diagnosis not present

## 2019-09-24 ENCOUNTER — Ambulatory Visit (INDEPENDENT_AMBULATORY_CARE_PROVIDER_SITE_OTHER): Payer: Medicare Other | Admitting: Physician Assistant

## 2019-09-24 ENCOUNTER — Other Ambulatory Visit: Payer: Self-pay

## 2019-09-24 DIAGNOSIS — I6523 Occlusion and stenosis of bilateral carotid arteries: Secondary | ICD-10-CM | POA: Diagnosis not present

## 2019-09-24 NOTE — Progress Notes (Signed)
Virtual Visit via Telephone Note    I connected with Sara Holt on 09/24/2019 using the [email or web address] by telephone and verified that I was speaking with the correct person using two identifiers. Patient was located at home  I am located at [facility].   The limitations of evaluation and management by telemedicine and the availability of in person appointments have been previously discussed with the patient and are documented in the patients chart. The patient expressed understanding and consented to proceed.  PCP: Sara Huddle, MD    History of Present Illness: Sara Holt is a 85 y.o. female who status post left CEA in January 2012by Dr. Donnetta Holt.  She has been followed at 6 month intervals for carotid stenosis surveillance.      She denise amaurosis, weakness, and aphasia.  No recent signs or symptoms of CVA or TIA.  She denise fever, chills and SOB.    She continues to monitor her DM.  She takes a daily Statin, Aspirin, and Betablocker.    Past Medical History:  Diagnosis Date  . Allergy   . Anemia   . Arthritis    OA  . Asthma   . Carotid artery occlusion   . Diabetes mellitus    DIET CONTROLLED, NO MEDS  . Disc    problems in neck and back  . GERD (gastroesophageal reflux disease)   . Hyperlipidemia   . Hypertension   . Neuromuscular disorder ([place])    nerve problems with hands  . Osteoporosis   . Sleep apnea     Past Surgical History:  Procedure Laterality Date  . CAROTID ARTERY ANGIOPLASTY    . CAROTID ENDARTERECTOMY  Jan. 16,2012   LEFT cea  . carpel tunnel     both hands  . COLONOSCOPY    . JOINT REPLACEMENT Left 2011   Knee  . KNEE ARTHROSCOPY Left 2010  . POLYPECTOMY      No outpatient medications have been marked as taking for the 09/24/19 encounter (Appointment) with [facility].    12 system ROS was negative unless otherwise noted in HPI   Observations/Objective: cm/s EDV cm/s Stenosis Plaque Description Comments CCA Prox 64 9 CCA Mid 62  11 heterogenous CCA Distal 64 11 heterogenous ICA Prox 317 65 60-79% ICA Mid 153 25 ICA Distal 51 13 ECA 212 0 >50% heterogenous and calcific PSV cm/s EDV cms Describe Arm Pressure (mmHG) Subclavian 103 Multiphasic, WNL Vertebral PSV cm/s 36 EDV cm/s 9 Antegrade PSV cm/s EDV cm/s Stenosis Plaque Description Comments CCA Prox 59 10 CCA Mid 100 18 heterogenous and calcific CCA Distal 70 9 heterogenous ICA Prox 71 18 1-39% heterogenous ICA Mid 85 22 ICA Distal 87 14 ECA 191 0 PSV cm/s EDV cm/s Describe Arm Pressure (mmHG) Subclavian 108 Multiphasic, WNL Vertebral PSV cm/s 48 EDV cm/s 9 Antegrade Final Sara Holt [id number] 09/23/2019 *See table(s) above for measurements and observations. Electronically signed by Harold Barban MD on 09/23/2019 at 1:59:04 PM. Right Carotid: Velocities in the right ICA are consistent with a 60-79% stenosis. The ECA appears >50% stenosed. Left Carotid: Velocities in the left ICA are consistent with a 1-39% stenosis. Vertebrals: Bilateral vertebral arteries demonstrate antegrade flow. Subclavians: Normal flow hemodynamics were seen in bilateral subclavian arteries.  Assessment and Plan:  Carotid stenosis s/p left CEA 2012 The right ICA stenosis has remained stable at 50-79 % stenosis with no noted change is velocities.   She remains asymptomatic without signs or symptoms of CVA or  TIA.    Follow Up Instructions:   Follow up 6 months for repat carotid duplex.   I discussed the assessment and treatment plan with the patient. The patient was provided an opportunity to ask questions and all were answered. The patient agreed with the plan and demonstrated an understanding of the instructions.   The patient was advised to call back or seek an in-person evaluation if the symptoms worsen or if the condition fails to improve as anticipated.  I spent 15 minutes with the patient via telephone encounter.   Signed, Roxy Horseman [facility] Office: [phone]  09/24/2019, 12:34 PM

## 2019-09-25 ENCOUNTER — Other Ambulatory Visit: Payer: Self-pay | Admitting: *Deleted

## 2019-09-25 DIAGNOSIS — I6523 Occlusion and stenosis of bilateral carotid arteries: Secondary | ICD-10-CM

## 2019-10-14 ENCOUNTER — Encounter: Payer: Self-pay | Admitting: Hematology & Oncology

## 2019-10-14 ENCOUNTER — Inpatient Hospital Stay: Payer: Medicare Other | Attending: Hematology & Oncology

## 2019-10-14 ENCOUNTER — Other Ambulatory Visit: Payer: Self-pay

## 2019-10-14 ENCOUNTER — Inpatient Hospital Stay (HOSPITAL_BASED_OUTPATIENT_CLINIC_OR_DEPARTMENT_OTHER): Payer: Medicare Other | Admitting: Hematology & Oncology

## 2019-10-14 ENCOUNTER — Other Ambulatory Visit: Payer: Self-pay | Admitting: *Deleted

## 2019-10-14 VITALS — BP 134/58 | HR 61 | Temp 96.9°F | Resp 17 | Wt 160.0 lb

## 2019-10-14 DIAGNOSIS — D509 Iron deficiency anemia, unspecified: Secondary | ICD-10-CM | POA: Diagnosis not present

## 2019-10-14 DIAGNOSIS — N289 Disorder of kidney and ureter, unspecified: Secondary | ICD-10-CM | POA: Diagnosis not present

## 2019-10-14 DIAGNOSIS — D500 Iron deficiency anemia secondary to blood loss (chronic): Secondary | ICD-10-CM

## 2019-10-14 DIAGNOSIS — D631 Anemia in chronic kidney disease: Secondary | ICD-10-CM

## 2019-10-14 LAB — CMP (CANCER CENTER ONLY)
ALT: 13 U/L (ref 0–44)
AST: 17 U/L (ref 15–41)
Albumin: 4.6 g/dL (ref 3.5–5.0)
Alkaline Phosphatase: 33 U/L — ABNORMAL LOW (ref 38–126)
Anion gap: 7 (ref 5–15)
BUN: 25 mg/dL — ABNORMAL HIGH (ref 8–23)
CO2: 33 mmol/L — ABNORMAL HIGH (ref 22–32)
Calcium: 10.9 mg/dL — ABNORMAL HIGH (ref 8.9–10.3)
Chloride: 101 mmol/L (ref 98–111)
Creatinine: 1.26 mg/dL — ABNORMAL HIGH (ref 0.44–1.00)
GFR, Est AFR Am: 46 mL/min — ABNORMAL LOW (ref >60)
GFR, Estimated: 40 mL/min — ABNORMAL LOW (ref >60)
Glucose, Bld: 223 mg/dL — ABNORMAL HIGH (ref 70–99)
Potassium: 4.4 mmol/L (ref 3.5–5.1)
Sodium: 141 mmol/L (ref 135–145)
Total Bilirubin: 0.6 mg/dL (ref 0.3–1.2)
Total Protein: 6.4 g/dL — ABNORMAL LOW (ref 6.5–8.1)

## 2019-10-14 LAB — CBC WITH DIFFERENTIAL (CANCER CENTER ONLY)
Abs Immature Granulocytes: 0.05 10*3/uL (ref 0.00–0.07)
Basophils Absolute: 0.1 10*3/uL (ref 0.0–0.1)
Basophils Relative: 1 %
Eosinophils Absolute: 0.3 10*3/uL (ref 0.0–0.5)
Eosinophils Relative: 5 %
HCT: 39.6 % (ref 36.0–46.0)
Hemoglobin: 13.3 g/dL (ref 12.0–15.0)
Immature Granulocytes: 1 %
Lymphocytes Relative: 29 %
Lymphs Abs: 2.1 10*3/uL (ref 0.7–4.0)
MCH: 29.9 pg (ref 26.0–34.0)
MCHC: 33.6 g/dL (ref 30.0–36.0)
MCV: 89 fL (ref 80.0–100.0)
Monocytes Absolute: 0.4 10*3/uL (ref 0.1–1.0)
Monocytes Relative: 6 %
Neutro Abs: 4.2 10*3/uL (ref 1.7–7.7)
Neutrophils Relative %: 58 %
Platelet Count: 203 10*3/uL (ref 150–400)
RBC: 4.45 MIL/uL (ref 3.87–5.11)
RDW: 13.3 % (ref 11.5–15.5)
WBC Count: 7.1 10*3/uL (ref 4.0–10.5)
nRBC: 0 % (ref 0.0–0.2)

## 2019-10-14 LAB — RETICULOCYTES
Immature Retic Fract: 13.4 % (ref 2.3–15.9)
RBC.: 4.41 MIL/uL (ref 3.87–5.11)
Retic Count, Absolute: 85.6 10*3/uL (ref 19.0–186.0)
Retic Ct Pct: 1.9 % (ref 0.4–3.1)

## 2019-10-14 NOTE — Progress Notes (Signed)
Hematology and Oncology Follow Up Visit  Sara Holt [id number] May 09, 1935 85 y.o. 10/14/2019   Principle Diagnosis:  Iron deficiency anemia Anemia of renal insufficiency  Current Therapy:   IV iron as indicated    Interim History:  Sara Holt is here today for follow-up.  She is doing quite well.  We see her every 6 months.  At this point, I think we can probably let her go from the clinic as her blood count has been holding steady for a year.  She has had her coronavirus vaccines.  She is still very diligent with staying away from people.  She has had no problems with nausea or vomiting.  She has had no change in bowel or bladder habits.  She has had no rashes.  I think her diabetes probably will be a much bigger issue for her then anemia.  Back in October 2020, her ferritin was 20 with iron saturation of 24%.Marland Kitchen   ECOG Performance Status: 0 - Asymptomatic  Medications:  Allergies as of 10/14/2019      Reactions   Buprenorphine Hcl Nausea And Vomiting   Other reaction(s): Nausea And Vomiting nausea nausea Other reaction(s): Nausea And Vomiting   Morphine Nausea And Vomiting   Other reaction(s): Nausea And Vomiting Other reaction(s): Nausea And Vomiting   Sulfacetamide Sodium Nausea And Vomiting      Medication List       Accurate as of October 14, 2019 12:35 PM. If you have any questions, ask your nurse or doctor.        acetaminophen 325 MG tablet Commonly known as: TYLENOL Take 650 mg by mouth every 6 (six) hours as needed.   ammonium lactate 12 % cream Commonly known as: AMLACTIN APPLY TOPICALLY TO THE AFFECTED AREA EVERY DAY AFTER THE SHOWER   aspirin EC 81 MG tablet Take 81 mg by mouth daily.   azelastine 0.1 % nasal spray Commonly known as: ASTELIN U 1 SPR IEN BID PRN   carvedilol 12.5 MG tablet Commonly known as: COREG TAKE 1 TABLET BY MOUTH TWO  TIMES DAILY   cholecalciferol 1000 units tablet Commonly known as: VITAMIN D Take 1,000  Units by mouth daily.   cyanocobalamin 1000 MCG tablet Take by mouth.   Cymbalta 60 MG capsule Generic drug: DULoxetine Take 1 tablet by mouth daily.   desoximetasone 0.25 % cream Commonly known as: TOPICORT Apply topically continuous as needed (skin allergy).   fluticasone 50 MCG/ACT nasal spray Commonly known as: FLONASE   fluticasone furoate-vilanterol 100-25 MCG/INH Aepb Commonly known as: BREO ELLIPTA Inhale 1 puff into the lungs once.   glimepiride 4 MG tablet Commonly known as: AMARYL Take 4 mg by mouth.   loratadine 10 MG tablet Commonly known as: CLARITIN Take 10 mg by mouth.   losartan-hydrochlorothiazide 50-12.5 MG tablet Commonly known as: HYZAAR Take 1 tablet by mouth daily.   Lutein 10 MG Tabs Take by mouth.   metFORMIN 500 MG 24 hr tablet Commonly known as: GLUCOPHAGE-XR Take 1 mg by mouth daily with breakfast.   methocarbamol 500 MG tablet Commonly known as: ROBAXIN Take 500 mg by mouth daily as needed.   montelukast 10 MG tablet Commonly known as: SINGULAIR   omeprazole 40 MG capsule Commonly known as: PRILOSEC Take 40 mg by mouth.   ONE TOUCH ULTRA TEST test strip Generic drug: glucose blood   glucose blood test strip   OVER THE COUNTER MEDICATION Place 1 drop into both eyes at bedtime. dry eyes med  oxybutynin 5 MG tablet Commonly known as: DITROPAN Take 5 mg by mouth daily.   simvastatin 10 MG tablet Commonly known as: ZOCOR Take 10 mg by mouth Daily.   VITAMIN E/FOLIC [id number] PO Take by mouth.   Voltaren 1 % Gel Generic drug: diclofenac sodium Apply 4 g topically as needed (pain).       Allergies:  Allergies  Allergen Reactions  . Buprenorphine Hcl Nausea And Vomiting    Other reaction(s): Nausea And Vomiting nausea nausea Other reaction(s): Nausea And Vomiting  . Morphine Nausea And Vomiting    Other reaction(s): Nausea And Vomiting Other reaction(s): Nausea And Vomiting  . Sulfacetamide Sodium Nausea  And Vomiting    Past Medical History, Surgical history, Social history, and Family History were reviewed and updated.  Review of Systems: Review of Systems  Constitutional: Negative.   HENT: Negative.   Eyes: Negative.   Respiratory: Negative.   Cardiovascular: Negative.   Gastrointestinal: Negative.   Genitourinary: Negative.   Musculoskeletal: Negative.   Skin: Negative.   Neurological: Negative.   Endo/Heme/Allergies: Negative.   Psychiatric/Behavioral: Negative.       Physical Exam:  weight is 160 lb (72.6 kg). Her temporal temperature is 96.9 F (36.1 C) (abnormal). Her blood pressure is 134/58 (abnormal) and her pulse is 61. Her respiration is 17.   Wt Readings from Last 3 Encounters:  10/14/19 160 lb (72.6 kg)  06/13/19 162 lb (73.5 kg)  03/21/19 163 lb (73.9 kg)    Physical Exam Vitals reviewed.  HENT:     Head: Normocephalic and atraumatic.  Eyes:     Pupils: Pupils are equal, round, and reactive to light.  Cardiovascular:     Rate and Rhythm: Normal rate and regular rhythm.     Heart sounds: Normal heart sounds.  Pulmonary:     Effort: Pulmonary effort is normal.     Breath sounds: Normal breath sounds.  Abdominal:     General: Bowel sounds are normal.     Palpations: Abdomen is soft.  Musculoskeletal:        General: No tenderness or deformity. Normal range of motion.     Cervical back: Normal range of motion.  Lymphadenopathy:     Cervical: No cervical adenopathy.  Skin:    General: Skin is warm and dry.     Findings: No erythema or rash.  Neurological:     Mental Status: She is alert and oriented to person, place, and time.  Psychiatric:        Behavior: Behavior normal.        Thought Content: Thought content normal.        Judgment: Judgment normal.      Lab Results  Component Value Date   WBC 7.1 10/14/2019   HGB 13.3 10/14/2019   HCT 39.6 10/14/2019   MCV 89.0 10/14/2019   PLT 203 10/14/2019   Lab Results  Component Value Date    FERRITIN 20 04/15/2019   IRON 85 04/15/2019   TIBC 351 04/15/2019   UIBC 266 04/15/2019   IRONPCTSAT 24 04/15/2019   Lab Results  Component Value Date   RETICCTPCT 1.9 10/14/2019   RBC 4.41 10/14/2019   RBC 4.45 10/14/2019   RETICCTABS 63.2 01/12/2015   No results found for: KPAFRELGTCHN, LAMBDASER, KAPLAMBRATIO No results found for: IGGSERUM, IGA, IGMSERUM No results found for: Ronnald Ramp, A1GS, A2GS, Violet Baldy, MSPIKE, SPEI   Chemistry      Component Value Date/Time   NA 141 10/14/2019  1149   NA 141 06/16/2017 1114   NA 141 02/15/2016 0920   K 4.4 10/14/2019 1149   K 4.3 06/16/2017 1114   K 4.4 02/15/2016 0920   CL 101 10/14/2019 1149   CL 104 06/16/2017 1114   CO2 33 (H) 10/14/2019 1149   CO2 30 06/16/2017 1114   CO2 26 02/15/2016 0920   BUN 25 (H) 10/14/2019 1149   BUN 18 06/16/2017 1114   BUN 25.7 02/15/2016 0920   CREATININE 1.26 (H) 10/14/2019 1149   CREATININE 1.1 06/16/2017 1114   CREATININE 1.1 02/15/2016 0920      Component Value Date/Time   CALCIUM 10.9 (H) 10/14/2019 1149   CALCIUM 10.4 (H) 06/16/2017 1114   CALCIUM 10.4 02/15/2016 0920   ALKPHOS 33 (L) 10/14/2019 1149   ALKPHOS 38 06/16/2017 1114   ALKPHOS 33 (L) 02/15/2016 0920   AST 17 10/14/2019 1149   AST 18 02/15/2016 0920   ALT 13 10/14/2019 1149   ALT 21 06/16/2017 1114   ALT 13 02/15/2016 0920   BILITOT 0.6 10/14/2019 1149   BILITOT 0.60 02/15/2016 0920       Impression and Plan: Sara Holt is a very pleasant 85 yo caucasian female with history of iron deficiency.   Again, her hemoglobin has been holding steady for a year..  I just think that we can hold off on seeing her at this point.  I told her that she can always come back if she has any problems.  I did give her some recommendations for a seafood restaurant in an [place]  She and her husband go out every night for dinner.  They are looking for new places to eat.   [name], MD  4/12/202112:35 PM

## 2019-10-15 ENCOUNTER — Telehealth: Payer: Self-pay | Admitting: Hematology & Oncology

## 2019-10-15 LAB — IRON AND TIBC
Iron: 124 ug/dL (ref 41–142)
Saturation Ratios: 33 % (ref 21–57)
TIBC: 379 ug/dL (ref 236–444)
UIBC: 255 ug/dL (ref 120–384)

## 2019-10-15 LAB — FERRITIN: Ferritin: 17 ng/mL (ref 11–307)

## 2019-10-15 NOTE — Telephone Encounter (Signed)
No los 4/12

## 2020-05-11 ENCOUNTER — Other Ambulatory Visit: Payer: Self-pay

## 2020-05-11 DIAGNOSIS — I6523 Occlusion and stenosis of bilateral carotid arteries: Secondary | ICD-10-CM

## 2020-05-19 ENCOUNTER — Ambulatory Visit: Payer: Medicare Other | Admitting: Physician Assistant

## 2020-05-19 ENCOUNTER — Ambulatory Visit (HOSPITAL_COMMUNITY)
Admission: RE | Admit: 2020-05-19 | Discharge: 2020-05-19 | Disposition: A | Discharge status: Home / Self Care | Payer: Medicare Other | Source: Ambulatory Visit | Attending: Physician Assistant | Admitting: Physician Assistant

## 2020-05-19 ENCOUNTER — Other Ambulatory Visit: Payer: Self-pay

## 2020-05-19 VITALS — BP 146/82 | HR 61 | Temp 98.3°F | Resp 20 | Ht 64.0 in | Wt 161.2 lb

## 2020-05-19 DIAGNOSIS — Z9889 Other specified postprocedural states: Secondary | ICD-10-CM | POA: Diagnosis not present

## 2020-05-19 DIAGNOSIS — I6523 Occlusion and stenosis of bilateral carotid arteries: Secondary | ICD-10-CM | POA: Insufficient documentation

## 2020-05-19 NOTE — Progress Notes (Signed)
Office Note     CC:  follow up Requesting Provider:  Josetta Huddle, MD  HPI: Sara Holt is a 85 y.o. (1935-01-08) female who presents for routine follow-up of carotid artery disease. She is s/p left CEA on July 19, 2010 by Dr. Donnetta Hutching. She presented then with left amaurosis fugax.  She has had minor left-sided scalp pain over the last several weeks.  She denies monocular blindness, slurred speech, facial drooping, upper or lower extremity clumsiness or paralysis.  She is compliant with aspirin and statin. She is diabetic. She is on BB, ARB+HCTZ combo for essential hypertension. She is a non-smoker.   Past Medical History:  Diagnosis Date  . Allergy   . Anemia   . Arthritis    OA  . Asthma   . Carotid artery occlusion   . Diabetes mellitus    DIET CONTROLLED, NO MEDS  . Disc    problems in neck and back  . GERD (gastroesophageal reflux disease)   . Hyperlipidemia   . Hypertension   . Neuromuscular disorder ([place])    nerve problems with hands  . Osteoporosis   . Sleep apnea     Past Surgical History:  Procedure Laterality Date  . CAROTID ARTERY ANGIOPLASTY    . CAROTID ENDARTERECTOMY  Jan. 16,2012   LEFT cea  . carpel tunnel     both hands  . COLONOSCOPY    . JOINT REPLACEMENT Left 2011   Knee  . KNEE ARTHROSCOPY Left 2010  . POLYPECTOMY      Social History   Socioeconomic History  . Marital status: Married    Spouse name: Not on file  . Number of children: 2  . Years of education: Not on file  . Highest education level: Not on file  Occupational History  . Occupation: Retired    Fish farm manager: RETIRED  Tobacco Use  . Smoking status: Never Smoker  . Smokeless tobacco: Never Used  Vaping Use  . Vaping Use: Never used  Substance and Sexual Activity  . Alcohol use: Yes    Alcohol/week: 7.0 standard drinks    Types: 7 Glasses of wine per week  . Drug use: No  . Sexual activity: Not on file  Other Topics Concern  . Not on file  Social History  Narrative  . Not on file   Social Determinants of Health   Financial Resource Strain:   . Difficulty of Paying Living Expenses: Not on file  Food Insecurity:   . Worried About Charity fundraiser in the Last Year: Not on file  . Ran Out of Food in the Last Year: Not on file  Transportation Needs:   . Lack of Transportation (Medical): Not on file  . Lack of Transportation (Non-Medical): Not on file  Physical Activity:   . Days of Exercise per Week: Not on file  . Minutes of Exercise per Session: Not on file  Stress:   . Feeling of Stress : Not on file  Social Connections:   . Frequency of Communication with Friends and Family: Not on file  . Frequency of Social Gatherings with Friends and Family: Not on file  . Attends Religious Services: Not on file  . Active Member of Clubs or Organizations: Not on file  . Attends Archivist Meetings: Not on file  . Marital Status: Not on file  Intimate Partner Violence:   . Fear of Current or Ex-Partner: Not on file  . Emotionally Abused: Not on file  .  Physically Abused: Not on file  . Sexually Abused: Not on file   Family History  Problem Relation Age of Onset  . Diabetes Father   . Pneumonia Father   . Peripheral vascular disease Sister   . Colon cancer Maternal Grandmother   . Cancer Maternal Grandmother        Colon cancer  . Esophageal cancer Neg Hx   . Stomach cancer Neg Hx   . Rectal cancer Neg Hx     Current Outpatient Medications  Medication Sig Dispense Refill  . acetaminophen (TYLENOL) 325 MG tablet Take 650 mg by mouth every 6 (six) hours as needed.    Marland Kitchen ammonium lactate (AMLACTIN) 12 % cream APPLY TOPICALLY TO THE AFFECTED AREA EVERY DAY AFTER THE SHOWER    . aspirin EC 81 MG tablet Take 81 mg by mouth daily.    Marland Kitchen azelastine (ASTELIN) 0.1 % nasal spray U 1 SPR IEN BID PRN  5  . carvedilol (COREG) 12.5 MG tablet TAKE 1 TABLET BY MOUTH TWO  TIMES DAILY    . cholecalciferol (VITAMIN D) 1000 units tablet Take  1,000 Units by mouth daily.    . cyanocobalamin 1000 MCG tablet Take by mouth.    . CYMBALTA 60 MG capsule Take 1 tablet by mouth daily.     Marland Kitchen desoximetasone (TOPICORT) 0.25 % cream Apply topically continuous as needed (skin allergy).     Marland Kitchen diclofenac sodium (VOLTAREN) 1 % GEL Apply 4 g topically as needed (pain).     . fluticasone (FLONASE) 50 MCG/ACT nasal spray     . fluticasone furoate-vilanterol (BREO ELLIPTA) 100-25 MCG/INH AEPB Inhale 1 puff into the lungs once.    Marland Kitchen glimepiride (AMARYL) 4 MG tablet Take 4 mg by mouth.    Marland Kitchen glucose blood test strip     . loratadine (CLARITIN) 10 MG tablet Take 10 mg by mouth.    . losartan-hydrochlorothiazide (HYZAAR) 50-12.5 MG tablet Take 1 tablet by mouth daily.    . Lutein 10 MG TABS Take by mouth.    . metFORMIN (GLUCOPHAGE-XR) 500 MG 24 hr tablet Take 1 mg by mouth daily with breakfast.     . methocarbamol (ROBAXIN) 500 MG tablet Take 500 mg by mouth daily as needed.    . montelukast (SINGULAIR) 10 MG tablet     . Multiple Vitamin (VITAMIN E/FOLIC DGLO/V-5/I-43 PO) Take by mouth.    Marland Kitchen omeprazole (PRILOSEC) 40 MG capsule Take 40 mg by mouth.    . ONE TOUCH ULTRA TEST test strip   0  . OVER THE COUNTER MEDICATION Place 1 drop into both eyes at bedtime. dry eyes med    . oxybutynin (DITROPAN) 5 MG tablet Take 5 mg by mouth daily.    . simvastatin (ZOCOR) 10 MG tablet Take 10 mg by mouth Daily.      No current facility-administered medications for this visit.    Allergies  Allergen Reactions  . Buprenorphine Hcl Nausea And Vomiting    Other reaction(s): Nausea And Vomiting nausea nausea Other reaction(s): Nausea And Vomiting  . Morphine Nausea And Vomiting    Other reaction(s): Nausea And Vomiting Other reaction(s): Nausea And Vomiting  . Sulfacetamide Sodium Nausea And Vomiting     REVIEW OF SYSTEMS:   [X]  denotes positive finding, [ ]  denotes negative finding Cardiac  Comments:  Chest pain or chest pressure:    Shortness of  breath upon exertion:    Short of breath when lying flat:    Irregular  heart rhythm:        Vascular    Pain in calf, thigh, or hip brought on by ambulation:    Pain in feet at night that wakes you up from your sleep:     Blood clot in your veins:    Leg swelling:         Pulmonary    Oxygen at home:    Productive cough:     Wheezing:         Neurologic    Sudden weakness in arms or legs:     Sudden numbness in arms or legs:     Sudden onset of difficulty speaking or slurred speech:    Temporary loss of vision in one eye:     Problems with dizziness:         Gastrointestinal    Blood in stool:     Vomited blood:         Genitourinary    Burning when urinating:     Blood in urine:        Psychiatric    Major depression:         Hematologic    Bleeding problems:    Problems with blood clotting too easily:        Skin    Rashes or ulcers:        Constitutional    Fever or chills:      PHYSICAL EXAMINATION:  Vitals:   05/19/20 1303 05/19/20 1306  BP: (!) 155/85 (!) 146/82  Pulse: 61   Resp: 20   Temp: 98.3 F (36.8 C)   SpO2: 93%     General:  WDWN in NAD; vital signs documented above Gait:  HENT: WNL, normocephalic Pulmonary: normal non-labored breathing , without Rales, rhonchi,  wheezing Cardiac: irregular HR, without  Murmurs with left carotid bruit Abdomen: soft, NT, no masses Skin: without rashes Vascular Exam/Pulses: 2+ brachial, radial, ulnar, pulses bilaterally.  She has a 2+ right dorsalis pedis pulse and 2+ left posterior tibial pulse. Extremities: without ischemic changes, without Gangrene , without cellulitis; without open wounds;  Musculoskeletal:  Atrophy of right thenar eminence.  She has 5 out of 5 bilateral hand grip, triceps and biceps strength.  Neurologic: A&O X 3;  No focal weakness or paresthesias are detected Psychiatric:  The pt has Normal affect.   Non-Invasive Vascular Imaging:   05/19/2020 Right Carotid: Velocities in the  right ICA are consistent with a 60-79% stenosis. The ECA appears >50% stenosed.   Left Carotid: Velocities in the left ICA are consistent with a 1-39% stenosis.   Vertebrals: Bilateral vertebral arteries demonstrate antegrade flow.   Subclavians: Right subclavian artery flow was disturbed. Normal flow hemodynamics were seen in the left subclavian artery.     ASSESSMENT/PLAN:: 85 y.o. female here for follow up for carotid artery stenosis.  The patient is asymptomatic.  Carotid duplex study today reveals stable exam.  Continue aspirin and statin.  Encouraged good glucose control and blood pressure control.  We reviewed signs and symptoms of stroke/TIA and advised her to seek immediate medical attention should these occur.  We will follow-up in 6 months with carotid artery duplex  Barbie Banner, PA-C [facility] [phone]  Clinic MD:   Stanford Breed

## 2020-05-29 ENCOUNTER — Other Ambulatory Visit (HOSPITAL_BASED_OUTPATIENT_CLINIC_OR_DEPARTMENT_OTHER): Payer: Self-pay | Admitting: Medical

## 2020-05-29 ENCOUNTER — Emergency Department (HOSPITAL_BASED_OUTPATIENT_CLINIC_OR_DEPARTMENT_OTHER): Payer: Medicare Other

## 2020-05-29 ENCOUNTER — Emergency Department (HOSPITAL_BASED_OUTPATIENT_CLINIC_OR_DEPARTMENT_OTHER)
Admission: EM | Admit: 2020-05-29 | Discharge: 2020-05-29 | Disposition: A | Discharge status: Home / Self Care | Payer: Medicare Other | Attending: Emergency Medicine | Admitting: Emergency Medicine

## 2020-05-29 ENCOUNTER — Encounter (HOSPITAL_BASED_OUTPATIENT_CLINIC_OR_DEPARTMENT_OTHER): Payer: Self-pay

## 2020-05-29 ENCOUNTER — Other Ambulatory Visit: Payer: Self-pay

## 2020-05-29 DIAGNOSIS — R059 Cough, unspecified: Secondary | ICD-10-CM | POA: Diagnosis present

## 2020-05-29 DIAGNOSIS — Z7982 Long term (current) use of aspirin: Secondary | ICD-10-CM | POA: Diagnosis not present

## 2020-05-29 DIAGNOSIS — Z96652 Presence of left artificial knee joint: Secondary | ICD-10-CM | POA: Insufficient documentation

## 2020-05-29 DIAGNOSIS — J45909 Unspecified asthma, uncomplicated: Secondary | ICD-10-CM | POA: Diagnosis not present

## 2020-05-29 DIAGNOSIS — Z7951 Long term (current) use of inhaled steroids: Secondary | ICD-10-CM | POA: Insufficient documentation

## 2020-05-29 DIAGNOSIS — J40 Bronchitis, not specified as acute or chronic: Secondary | ICD-10-CM | POA: Insufficient documentation

## 2020-05-29 DIAGNOSIS — Z20822 Contact with and (suspected) exposure to covid-19: Secondary | ICD-10-CM | POA: Diagnosis not present

## 2020-05-29 DIAGNOSIS — N183 Chronic kidney disease, stage 3 unspecified: Secondary | ICD-10-CM | POA: Insufficient documentation

## 2020-05-29 DIAGNOSIS — E1142 Type 2 diabetes mellitus with diabetic polyneuropathy: Secondary | ICD-10-CM | POA: Diagnosis not present

## 2020-05-29 DIAGNOSIS — I129 Hypertensive chronic kidney disease with stage 1 through stage 4 chronic kidney disease, or unspecified chronic kidney disease: Secondary | ICD-10-CM | POA: Insufficient documentation

## 2020-05-29 DIAGNOSIS — Z79899 Other long term (current) drug therapy: Secondary | ICD-10-CM | POA: Diagnosis not present

## 2020-05-29 DIAGNOSIS — E1122 Type 2 diabetes mellitus with diabetic chronic kidney disease: Secondary | ICD-10-CM | POA: Insufficient documentation

## 2020-05-29 DIAGNOSIS — Z7984 Long term (current) use of oral hypoglycemic drugs: Secondary | ICD-10-CM | POA: Insufficient documentation

## 2020-05-29 DIAGNOSIS — R062 Wheezing: Secondary | ICD-10-CM

## 2020-05-29 LAB — CBG MONITORING, ED: Glucose-Capillary: 110 mg/dL — ABNORMAL HIGH (ref 70–99)

## 2020-05-29 LAB — RESP PANEL BY RT-PCR (FLU A&B, COVID) ARPGX2
Influenza A by PCR: NEGATIVE
Influenza B by PCR: NEGATIVE
SARS Coronavirus 2 by RT PCR: NEGATIVE

## 2020-05-29 MED ORDER — ALBUTEROL SULFATE (2.5 MG/3ML) 0.083% IN NEBU
2.5000 mg | INHALATION_SOLUTION | Freq: Once | RESPIRATORY_TRACT | Status: AC
  Administered 2020-05-29: 2.5 mg via RESPIRATORY_TRACT
  Filled 2020-05-29: qty 3

## 2020-05-29 MED ORDER — ALBUTEROL SULFATE HFA 108 (90 BASE) MCG/ACT IN AERS
4.0000 | INHALATION_SPRAY | Freq: Once | RESPIRATORY_TRACT | Status: AC
  Administered 2020-05-29: 4 via RESPIRATORY_TRACT
  Filled 2020-05-29: qty 6.7

## 2020-05-29 MED ORDER — AEROCHAMBER PLUS FLO-VU LARGE MISC
1.0000 | Freq: Once | Status: AC
  Administered 2020-05-29: 1
  Filled 2020-05-29: qty 1

## 2020-05-29 MED ORDER — AZITHROMYCIN 250 MG PO TABS: 250.0000 mg | ORAL_TABLET | Freq: Every day | ORAL | 0 refills | Status: DC

## 2020-05-29 MED FILL — AZITHROMYCIN 250 MG TABLET: 250 | 5 days supply | Qty: 6 | Fill #0

## 2020-05-29 NOTE — ED Triage Notes (Signed)
Pt states she has had an asthma flair up for the past week. Pt was seen at her pcp recently for same problem. Pt reports wheezing, coughing and shortness of breath. Pt in NAD, speaking in full sentences.

## 2020-05-29 NOTE — ED Provider Notes (Signed)
Medical screening examination/treatment/procedure(s) were conducted as a shared visit with non-physician practitioner(s) and myself.  I personally evaluated the patient during the encounter.  EKG Interpretation  Date/Time:  Friday May 29 2020 13:53:11 EST Ventricular Rate:  72 PR Interval:  152 QRS Duration: 130 QT Interval:  436 QTC Calculation: 477 R Axis:   65 Text Interpretation: Sinus rhythm with marked sinus arrhythmia Right bundle branch block Abnormal ECG no sig change from previous Confirmed by Charlesetta Shanks [phone]) on 05/29/2020 3:17:48 PM  Patient has had a very harsh cough for a week.  She reports she has had some shortness of breath.  She has history of asthma.  No fever.  No chest pain.  Patient reports she has some general fatigue and malaise.  She feels generally somewhat achy.  No swelling in the legs or pain in the calves.  No vomiting diarrhea or abdominal pain.  Patient got seen by her PCP and given Hycodan syrup for cough.  She has been using her albuterol nebulizer.  She continued to feel ill today and her husband encouraged her to get a recheck at the hospital.  Patient has had Covid plus booster and influenza immunizations.  Patient is alert and nontoxic.  Slightly fatigued in appearance.  No respiratory distress.  Well-nourished well-developed.  Heart regular no rub murmur gallop.  Lungs grossly clear with occasional expiratory wheeze bases.  No rhonchi.  Abdomen soft and nondistended.  No peripheral edema.  Calves are soft nontender.  Vital signs are stable with no hypoxia or tachycardia.  Patient is afebrile.  Chest x-ray clear.  Covid and influenza testing is negative.  Patient symptoms suggestive of more systemic infectious illness.  She does have very pronounced and harsh cough.  At this time, consistent with a bronchitis with asthma.  Patient has been symptomatic for over a week.  Will opt to treat with Zithromax and continue already prescribed Hycodan and  nebulizer therapy.  Strict return precautions reviewed.   Charlesetta Shanks, MD 05/29/20 1610

## 2020-05-29 NOTE — Discharge Instructions (Signed)
At this time there does not appear to be the presence of an emergent medical condition, however there is always the potential for conditions to change. Please read and follow the below instructions.  Please return to the Emergency Department immediately for any new or worsening symptoms or if your symptoms do not improve within 3 days. Please be sure to follow up with your Primary Care Provider within one week regarding your visit today; please call their office to schedule an appointment even if you are feeling better for a follow-up visit. Please take your antibiotic Azithromycin as prescribed until complete to help with your symptoms.  Please drink enough water to avoid dehydration and get plenty of rest. Continue taking the medication albuterol as prescribed by your primary care provider, continue albuterol is not helping with your wheezing or shortness of breath then call 911 and return to the emergency department immediately for evaluation.  Go to the nearest Emergency Department immediately if: You have fever or chills You cough up blood. You have chest pain. You have bad shortness of breath. You become dehydrated. You faint or keep feeling like you are going to faint. You keep vomiting. You have a very bad headache. You have any new/concerning or worsening of symptoms  Please read the additional information packets attached to your discharge summary.  Do not take your medicine if  develop an itchy rash, swelling in your mouth or lips, or difficulty breathing; call 911 and seek immediate emergency medical attention if this occurs.  You may review your lab tests and imaging results in their entirety on your MyChart account.  Please discuss all results of fully with your primary care provider and other specialist at your follow-up visit.  Note: Portions of this text may have been transcribed using voice recognition software. Every effort was made to ensure accuracy; however, inadvertent  computerized transcription errors may still be present.

## 2020-05-29 NOTE — ED Provider Notes (Signed)
[facility] EMERGENCY DEPARTMENT Provider Note   CSN: [id number] Arrival date & time: 05/29/20  1340     History Chief Complaint  Patient presents with  . Asthma    Sara Holt is a 85 y.o. female history of asthma, carotid artery occlusion, diabetes, chronic neck and back pain, GERD, hypertension, hyperlipidemia, right bundle branch block.  Patient reports that 7 days ago she developed symptoms consistent with her asthma.  She reports she has been having nonproductive cough and wheezing which have been constant for the past 7 days, she reports that she called her PCP and inform them of her symptoms and they refilled her albuterol inhaler which she has been using with minimal relief.  Patient reports shortness of breath only when she is having coughing fit, no associated chest pain.  She reports this feels exactly similar to previous asthma exacerbations.  She reports cough is occasionally associated with greenish sputum production.  Denies fever/chills, headache, chest pain, hemoptysis, abdominal pain, nausea/vomiting, diarrhea, extremity swelling/color change or any additional concerns.  Patient reports that she has had 3 COVID-19 vaccines.  HPI     Past Medical History:  Diagnosis Date  . Allergy   . Anemia   . Arthritis    OA  . Asthma   . Carotid artery occlusion   . Diabetes mellitus    DIET CONTROLLED, NO MEDS  . Disc    problems in neck and back  . GERD (gastroesophageal reflux disease)   . Hyperlipidemia   . Hypertension   . Neuromuscular disorder ([place])    nerve problems with hands  . Osteoporosis   . Sleep apnea     Patient Active Problem List   Diagnosis Date Noted  . Neck pain 04/12/2018  . Ulnar neuropathy of left upper extremity 03/16/2018  . Carotid artery occlusion 01/09/2017  . PAD (peripheral artery disease) ([place]) 01/09/2017  . RBBB 01/09/2017  . Shortness of breath 01/09/2017  . Essential hypertension 07/08/2016  . Reactive  airway disease 06/10/2016  . Reactive airway disease with wheezing with acute exacerbation 06/10/2016  . Acute kidney injury superimposed on chronic kidney disease ([place]) 06/10/2016  . Elevated troponin 06/10/2016  . Chronic kidney disease, stage III (moderate) ([place]) 12/25/2015  . Type 2 diabetes mellitus without complication ([place]) 29/51/8841  . Anemia of renal disease 08/17/2015  . Anemia, iron deficiency 01/22/2015  . Gait disorder 12/11/2014  . Diabetes, polyneuropathy ([place]) 12/11/2014  . CMC arthritis, thumb, degenerative 12/03/2014  . Near syncope 08/21/2014  . Numbness and tingling in right hand 03/04/2014  . [facility] Leg 03/04/2014  . Pain of right lower leg 03/04/2014  . Aftercare following surgery of the circulatory system, [place] 03/04/2014  . Brachial neuritis 12/08/2012  . Carpal tunnel syndrome 12/08/2012  . HLD (hyperlipidemia) 12/08/2012  . Arthritis, degenerative 12/08/2012  . Cervical spinal stenosis 12/08/2012  . Hyperlipidemia associated with type 2 diabetes mellitus ([place]) 12/08/2012  . Foot pain 09/21/2012  . Personal history of colonic polyps 09/12/2012  . Hemorrhage of rectum and anus 09/12/2012  . Hammertoe 05/24/2012  . Presence of unspecified artificial knee joint 05/24/2012  . Stress fracture of foot 05/24/2012  . Encounter for postoperative carotid endarterectomy surveillance 08/26/2011    Past Surgical History:  Procedure Laterality Date  . CAROTID ARTERY ANGIOPLASTY    . CAROTID ENDARTERECTOMY  Jan. 16,2012   LEFT cea  . carpel tunnel     both hands  . COLONOSCOPY    . JOINT REPLACEMENT Left  2011   Knee  . KNEE ARTHROSCOPY Left 2010  . POLYPECTOMY       OB History   No obstetric history on file.     Family History  Problem Relation Age of Onset  . Diabetes Father   . Pneumonia Father   . Peripheral vascular disease Sister   . Colon cancer Maternal Grandmother   . Cancer Maternal Grandmother        Colon cancer  . Esophageal  cancer Neg Hx   . Stomach cancer Neg Hx   . Rectal cancer Neg Hx     Social History   Tobacco Use  . Smoking status: Never Smoker  . Smokeless tobacco: Never Used  Vaping Use  . Vaping Use: Never used  Substance Use Topics  . Alcohol use: Yes    Comment: occassionally  . Drug use: No    Home Medications Prior to Admission medications   Medication Sig Start Date End Date Taking? Authorizing Provider  acetaminophen (TYLENOL) 325 MG tablet Take 650 mg by mouth every 6 (six) hours as needed.    [provider]  ammonium lactate (AMLACTIN) 12 % cream APPLY TOPICALLY TO THE AFFECTED AREA EVERY DAY AFTER THE SHOWER 07/08/19   [provider]  aspirin EC 81 MG tablet Take 81 mg by mouth daily.    [provider]  azelastine (ASTELIN) 0.1 % nasal spray U 1 SPR IEN BID PRN 02/14/17   [provider]  azithromycin (ZITHROMAX) 250 MG tablet Take 1 tablet (250 mg total) by mouth daily. Take first 2 tablets together, then 1 every day until finished. 05/29/20   Nuala Alpha A, PA-C  carvedilol (COREG) 12.5 MG tablet TAKE 1 TABLET BY MOUTH TWO  TIMES DAILY 09/14/16   [provider]  cholecalciferol (VITAMIN D) 1000 units tablet Take 1,000 Units by mouth daily.    [provider]  cyanocobalamin 1000 MCG tablet Take by mouth.    [provider]  CYMBALTA 60 MG capsule Take 1 tablet by mouth daily.  08/22/11   [provider]  desoximetasone (TOPICORT) 0.25 % cream Apply topically continuous as needed (skin allergy).  01/13/12   [provider]  diclofenac sodium (VOLTAREN) 1 % GEL Apply 4 g topically as needed (pain).     [provider]  fluticasone Asencion Islam) 50 MCG/ACT nasal spray  02/18/17   [provider]  fluticasone furoate-vilanterol (BREO ELLIPTA) 100-25 MCG/INH AEPB Inhale 1 puff into the lungs once.    [provider]  glimepiride (AMARYL) 4 MG tablet Take 4 mg by mouth.    [provider]  glucose blood test strip  10/23/17   [provider]  loratadine (CLARITIN) 10 MG tablet Take 10 mg by mouth. 11/29/16   [provider]  losartan-hydrochlorothiazide (HYZAAR) 50-12.5 MG tablet Take 1 tablet by mouth daily. 07/05/18   [provider]  Lutein 10 MG TABS Take by mouth.    [provider]  metFORMIN (GLUCOPHAGE-XR) 500 MG 24 hr tablet Take 1 mg by mouth daily with breakfast.  05/01/17   [provider]  methocarbamol (ROBAXIN) 500 MG tablet Take 500 mg by mouth daily as needed. 07/25/18   [provider]  montelukast (SINGULAIR) 10 MG tablet  03/19/19   [provider]  omeprazole (PRILOSEC) 40 MG capsule Take 40 mg by mouth.    [provider]  ONE TOUCH ULTRA TEST test strip  06/18/14   [provider]  OVER THE COUNTER MEDICATION Place 1 drop into both eyes at bedtime. dry eyes med    [provider]  oxybutynin (DITROPAN) 5 MG tablet Take 5 mg by mouth daily. 06/14/13   [provider]  simvastatin (ZOCOR) 10 MG tablet Take 10 mg by mouth Daily.  06/06/11   [provider]    Allergies    Buprenorphine hcl, Morphine, and Sulfacetamide sodium  Review of Systems   Review of Systems Ten systems are reviewed and are negative for acute change except as noted in the HPI  Physical Exam Updated Vital Signs BP (!) 143/80   Pulse 65   Temp 98.2 F (36.8 C) (Oral)   Resp 14   Ht 5\' 4"  (1.626 m)   Wt 73.5 kg   SpO2 94%   BMI 27.81 kg/m   Physical Exam Constitutional:      General: She is not in acute distress.    Appearance: Normal appearance. She is well-developed. She is not ill-appearing or diaphoretic.  HENT:     Head: Normocephalic and atraumatic.  Eyes:     General: Vision grossly intact. Gaze aligned appropriately.     Pupils: Pupils are equal, round, and reactive to light.  Neck:     Trachea: Trachea and phonation normal.  Cardiovascular:      Rate and Rhythm: Normal rate.  Pulmonary:     Effort: Pulmonary effort is normal. No tachypnea, accessory muscle usage or respiratory distress.     Breath sounds: Normal air entry. Decreased breath sounds and wheezing present. No rhonchi.  Abdominal:     General: There is no distension.     Palpations: Abdomen is soft.     Tenderness: There is no abdominal tenderness. There is no guarding or rebound.  Musculoskeletal:        General: Normal range of motion.     Cervical back: Normal range of motion.  Skin:    General: Skin is warm and dry.  Neurological:     Mental Status: She is alert.     GCS: GCS eye subscore is 4. GCS verbal subscore is 5. GCS motor subscore is 6.     Comments: Speech is clear and goal oriented, follows commands Major Cranial nerves without deficit, no facial droop Moves extremities without ataxia, coordination intact  Psychiatric:        Behavior: Behavior normal.     ED Results / Procedures / Treatments   Labs (all labs ordered are listed, but only abnormal results are displayed) Labs Reviewed  CBG MONITORING, ED - Abnormal; Notable for the following components:      Result Value   Glucose-Capillary 110 (*)    All other components within normal limits  RESP PANEL BY RT-PCR (FLU A&B, COVID) [id number]    EKG EKG Interpretation  Date/Time:  Friday May 29 2020 13:53:11 EST Ventricular Rate:  72 PR Interval:  152 QRS Duration: 130 QT Interval:  436 QTC Calculation: 477 R Axis:   65 Text Interpretation: Sinus rhythm with marked sinus arrhythmia Right bundle branch block Abnormal ECG no sig change from previous Confirmed by Charlesetta Shanks [phone]) on 05/29/2020 3:17:48 PM   Radiology DG Chest Port 1 View  Result Date: 05/29/2020 CLINICAL DATA:  Cough, wheezing, shortness of breath EXAM: PORTABLE CHEST 1 VIEW COMPARISON:  06/09/2016 FINDINGS: The heart size and mediastinal contours are within normal limits. Atherosclerotic calcification of the  aortic knob. Minimal left basilar scarring or atelectasis. Otherwise, no focal airspace consolidation, pleural  effusion, or pneumothorax. The visualized skeletal structures are unremarkable. IMPRESSION: No active disease. Electronically Signed   By: Davina Poke D.O.   On: 05/29/2020 15:09    Procedures Procedures (including critical care time)  Medications Ordered in ED Medications  albuterol (PROVENTIL) (2.5 MG/3ML) 0.083% nebulizer solution 2.5 mg (has no administration in time range)  AeroChamber Plus Flo-Vu Large MISC 1 each (1 each Other Given 05/29/20 1520)  albuterol (VENTOLIN HFA) 108 (90 Base) MCG/ACT inhaler 4 puff (4 puffs Inhalation Given 05/29/20 1520)    ED Course  I have reviewed the triage vital signs and the nursing notes.  Pertinent labs & imaging results that were available during my care of the patient were reviewed by me and considered in my medical decision making (see chart for details).    MDM Rules/Calculators/A&P                         Additional history obtained from: 1. Nursing notes from this visit. 2. Review of electronic medical records.  No recent pertinent ED visits.  Patient does appear to have a hospital admission in December 2017 with a diagnosis of reactive airway disease. --------- 85 year old female arrives with 7-day history of wheezing and occasionally productive cough with green sputum.  She feels this is consistent with her asthma exacerbations.  She does have wheezing and decreased lung sounds on exam.  Vital signs are stable she is resting comfortably and in no acute distress.  She recently had albuterol inhaler refilled by her PCP but does not feel this is helping.  Will attempt albuterol inhaler and spacer here to obtain chest x-ray EKG and Covid/influenza panel.  She denies any chest pain, hemoptysis or other symptoms suggestive of ACS/PE, pneumothorax, anemia or other emergent cardiopulmonary etiologies at this  time. ---------------------- Covid/influenza panel negative Chest x-ray:  IMPRESSION:  No active disease.   EKG: Sinus rhythm with marked sinus arrhythmia Right bundle branch block Abnormal ECG no sig change from previous Confirmed by Charlesetta Shanks [phone]) on 05/29/2020 3:17:48 PM ----------------------- 3:45 PM: Patient reassessment she is resting comfortably in bed no acute distress vital signs stable on room air without tachycardia or hypoxia.  She reports some improvement after albuterol inhaler earlier.  Reassessment of lung sounds show improvement of wheezing.  Patient would like to try an albuterol nebulizer. --------------- Patient seen and evaluated by Dr. Johnney Killian.  Plan of care is to start patient on Z-Pak and have close follow-up with PCP for bronchitis. - Patient reassessment she is resting comfortably in bed no acute distress finishing the nebulizer.  She reports that she is feeling well and would like to be discharged, she is agreeable to Z-Pak and follow-up with her PCP.  Vital signs stable on room air she has no complaints or concerns.  CBG 110.  Doubt PE, dissection, ACS, SIRS/sepsis, hyperglycemic emergency or other emergent pathologies at this time.  At this time there does not appear to be any evidence of an acute emergency medical condition and the patient appears stable for discharge with appropriate outpatient follow up. Diagnosis was discussed with patient who verbalizes understanding of care plan and is agreeable to discharge. I have discussed return precautions with patient and daughter who verbalizes understanding. Patient encouraged to follow-up with their PCP. All questions answered.   Note: Portions of this report may have been transcribed using voice recognition software. Every effort was made to ensure accuracy; however, inadvertent computerized transcription errors may still be  present. Final Clinical Impression(s) / ED Diagnoses Final diagnoses:  Bronchitis     Rx / DC Orders ED Discharge Orders         Ordered    azithromycin (ZITHROMAX) 250 MG tablet  Daily        05/29/20 1627           Gari Crown 05/29/20 1645    Charlesetta Shanks, MD 05/29/20 2030

## 2020-05-29 NOTE — ED Notes (Signed)
Patient in a hurry to leave, refused discharge vitals

## 2020-06-22 ENCOUNTER — Ambulatory Visit: Payer: Self-pay

## 2020-06-22 ENCOUNTER — Encounter: Payer: Self-pay | Admitting: Orthopaedic Surgery

## 2020-06-22 ENCOUNTER — Ambulatory Visit: Payer: Medicare Other | Admitting: Orthopaedic Surgery

## 2020-06-22 VITALS — Ht 64.0 in | Wt 162.0 lb

## 2020-06-22 DIAGNOSIS — M1711 Unilateral primary osteoarthritis, right knee: Secondary | ICD-10-CM

## 2020-06-22 DIAGNOSIS — M25561 Pain in right knee: Secondary | ICD-10-CM

## 2020-06-22 NOTE — Progress Notes (Signed)
Office Visit Note   Patient: Sara Holt           Date of Birth: 1935/01/02           MRN: [id number] Visit Date: 06/22/2020              Requested by: Josetta Huddle, MD [address] [place],  [place] [address] PCP: Josetta Huddle, MD   Assessment & Plan: Visit Diagnoses:  1. Right knee pain, unspecified chronicity   2. Unilateral primary osteoarthritis, right knee     Plan: At this point given her hip arthritis and her clinical exam findings I would recommend a knee replacement for her right knee.  She agrees with this treatment plan as well.  We had a long thorough discussion about what to expect with knee replacement surgery as well as the risk and benefits of this type of surgery.  She is fully aware of this having had a left knee replacement remotely.  All questions and concerns were answered and addressed.  She will eventually let us know when she would like to have this scheduled.  Again this all depends on her assisting her husband as he recovers from joint replacement surgery.  Follow-Up Instructions: Return for 2 weeks post-op.   Orders:  Orders Placed This Encounter  Procedures  . XR Knee 1-2 Views Right   No orders of the defined types were placed in this encounter.     Procedures: No procedures performed   Clinical Data: No additional findings.   Subjective: Chief Complaint  Patient presents with  . Right Knee - Pain  The patient is an 85 year old female that I am seeing for the first time as a patient but she is actually the wife of one of my current patients who is recovering from joint replacement surgery.  She has a history of a left total knee arthroplasty that was done many years ago.  She comes in today for her right knee.  She has been having knee pain for many years now.  She has tried and failed all forms of conservative treatment including multiple injections.  Now her leg feels "tired".  It hurts on a daily basis and her right  knee is detrimentally affecting her mobility, her quality of life and her activities day living.  She would like to eventually proceed with a knee replacement once her husband is fully recovered from his joint replacement.  At this point her pain can be 10 out of 10 at times.  She is an active 85 year old female and does not walk with an assistive device.  There are a lot of stairs in her house as well.  She is a prediabetic.  HPI  Review of Systems She currently denies any headache, chest pain, shortness of breath, fever, chills, nausea, vomiting  Objective: Vital Signs: Ht 5\' 4"  (1.626 m)   Wt 162 lb (73.5 kg)   BMI 27.81 kg/m   Physical Exam She is alert and oriented x3 and in no acute distress Ortho Exam Her right knee on exam does show some swelling.  She lacks full extension by few degrees and I can flex her past 90 degrees.  There is a lot of swelling of the bone but no significant effusion.  The knees ligaments are stable with significant varus malalignment.  There is significant medial joint line tenderness and quite significant patellofemoral crepitation. Specialty Comments:  No specialty comments available.  Imaging: XR Knee 1-2 Views Right  Result Date: 06/22/2020 2 views of the right knee show severe end-stage arthritis of the right knee.  There is complete loss of the medial joint space with varus malalignment.  There is an irregular joint surface as well medially.  There are large osteophytes in all 3 compartments and a large loose body.  There is severe patellofemoral arthritic changes.    PMFS History: Patient Active Problem List   Diagnosis Date Noted  . Unilateral primary osteoarthritis, right knee 06/22/2020  . Neck pain 04/12/2018  . Ulnar neuropathy of left upper extremity 03/16/2018  . Carotid artery occlusion 01/09/2017  . PAD (peripheral artery disease) ([place]) 01/09/2017  . RBBB 01/09/2017  . Shortness of breath 01/09/2017  . Essential hypertension  07/08/2016  . Reactive airway disease 06/10/2016  . Reactive airway disease with wheezing with acute exacerbation 06/10/2016  . Acute kidney injury superimposed on chronic kidney disease ([place]) 06/10/2016  . Elevated troponin 06/10/2016  . Chronic kidney disease, stage III (moderate) ([place]) 12/25/2015  . Type 2 diabetes mellitus without complication ([place]) 26/37/8588  . Anemia of renal disease 08/17/2015  . Anemia, iron deficiency 01/22/2015  . Gait disorder 12/11/2014  . Diabetes, polyneuropathy ([place]) 12/11/2014  . CMC arthritis, thumb, degenerative 12/03/2014  . Near syncope 08/21/2014  . Numbness and tingling in right hand 03/04/2014  . [facility] Leg 03/04/2014  . Pain of right lower leg 03/04/2014  . Aftercare following surgery of the circulatory system, [place] 03/04/2014  . Brachial neuritis 12/08/2012  . Carpal tunnel syndrome 12/08/2012  . HLD (hyperlipidemia) 12/08/2012  . Arthritis, degenerative 12/08/2012  . Cervical spinal stenosis 12/08/2012  . Hyperlipidemia associated with type 2 diabetes mellitus ([place]) 12/08/2012  . Foot pain 09/21/2012  . Personal history of colonic polyps 09/12/2012  . Hemorrhage of rectum and anus 09/12/2012  . Hammertoe 05/24/2012  . Presence of unspecified artificial knee joint 05/24/2012  . Stress fracture of foot 05/24/2012  . Encounter for postoperative carotid endarterectomy surveillance 08/26/2011   Past Medical History:  Diagnosis Date  . Allergy   . Anemia   . Arthritis    OA  . Asthma   . Carotid artery occlusion   . Diabetes mellitus    DIET CONTROLLED, NO MEDS  . Disc    problems in neck and back  . GERD (gastroesophageal reflux disease)   . Hyperlipidemia   . Hypertension   . Neuromuscular disorder ([place])    nerve problems with hands  . Osteoporosis   . Sleep apnea     Family History  Problem Relation Age of Onset  . Diabetes Father   . Pneumonia Father   . Peripheral vascular disease Sister   . Colon cancer  Maternal Grandmother   . Cancer Maternal Grandmother        Colon cancer  . Esophageal cancer Neg Hx   . Stomach cancer Neg Hx   . Rectal cancer Neg Hx     Past Surgical History:  Procedure Laterality Date  . CAROTID ARTERY ANGIOPLASTY    . CAROTID ENDARTERECTOMY  Jan. 16,2012   LEFT cea  . carpel tunnel     both hands  . COLONOSCOPY    . JOINT REPLACEMENT Left 2011   Knee  . KNEE ARTHROSCOPY Left 2010  . POLYPECTOMY     Social History   Occupational History  . Occupation: Retired    Fish farm manager: RETIRED  Tobacco Use  . Smoking status: Never Smoker  . Smokeless tobacco: Never Used  Vaping Use  .  Vaping Use: Never used  Substance and Sexual Activity  . Alcohol use: Yes    Comment: occassionally  . Drug use: No  . Sexual activity: Not on file

## 2020-07-07 DIAGNOSIS — M810 Age-related osteoporosis without current pathological fracture: Secondary | ICD-10-CM | POA: Diagnosis not present

## 2020-07-07 DIAGNOSIS — N183 Chronic kidney disease, stage 3 unspecified: Secondary | ICD-10-CM | POA: Diagnosis not present

## 2020-07-07 DIAGNOSIS — E1165 Type 2 diabetes mellitus with hyperglycemia: Secondary | ICD-10-CM | POA: Diagnosis not present

## 2020-07-07 DIAGNOSIS — E1122 Type 2 diabetes mellitus with diabetic chronic kidney disease: Secondary | ICD-10-CM | POA: Diagnosis not present

## 2020-07-07 DIAGNOSIS — J40 Bronchitis, not specified as acute or chronic: Secondary | ICD-10-CM | POA: Diagnosis not present

## 2020-07-07 DIAGNOSIS — E1140 Type 2 diabetes mellitus with diabetic neuropathy, unspecified: Secondary | ICD-10-CM | POA: Diagnosis not present

## 2020-07-07 DIAGNOSIS — I10 Essential (primary) hypertension: Secondary | ICD-10-CM | POA: Diagnosis not present

## 2020-07-07 DIAGNOSIS — E1151 Type 2 diabetes mellitus with diabetic peripheral angiopathy without gangrene: Secondary | ICD-10-CM | POA: Diagnosis not present

## 2020-07-07 DIAGNOSIS — D509 Iron deficiency anemia, unspecified: Secondary | ICD-10-CM | POA: Diagnosis not present

## 2020-07-07 DIAGNOSIS — K219 Gastro-esophageal reflux disease without esophagitis: Secondary | ICD-10-CM | POA: Diagnosis not present

## 2020-07-07 DIAGNOSIS — E785 Hyperlipidemia, unspecified: Secondary | ICD-10-CM | POA: Diagnosis not present

## 2020-07-09 DIAGNOSIS — Z7984 Long term (current) use of oral hypoglycemic drugs: Secondary | ICD-10-CM | POA: Diagnosis not present

## 2020-07-09 DIAGNOSIS — J45909 Unspecified asthma, uncomplicated: Secondary | ICD-10-CM | POA: Diagnosis not present

## 2020-07-09 DIAGNOSIS — Z8669 Personal history of other diseases of the nervous system and sense organs: Secondary | ICD-10-CM | POA: Diagnosis not present

## 2020-07-09 DIAGNOSIS — E1140 Type 2 diabetes mellitus with diabetic neuropathy, unspecified: Secondary | ICD-10-CM | POA: Diagnosis not present

## 2020-07-09 DIAGNOSIS — I10 Essential (primary) hypertension: Secondary | ICD-10-CM | POA: Diagnosis not present

## 2020-07-09 DIAGNOSIS — E1122 Type 2 diabetes mellitus with diabetic chronic kidney disease: Secondary | ICD-10-CM | POA: Diagnosis not present

## 2020-07-09 DIAGNOSIS — E1151 Type 2 diabetes mellitus with diabetic peripheral angiopathy without gangrene: Secondary | ICD-10-CM | POA: Diagnosis not present

## 2020-07-09 DIAGNOSIS — E1165 Type 2 diabetes mellitus with hyperglycemia: Secondary | ICD-10-CM | POA: Diagnosis not present

## 2020-07-09 DIAGNOSIS — N183 Chronic kidney disease, stage 3 unspecified: Secondary | ICD-10-CM | POA: Diagnosis not present

## 2020-07-10 ENCOUNTER — Other Ambulatory Visit: Payer: Self-pay | Admitting: Internal Medicine

## 2020-07-10 DIAGNOSIS — M5416 Radiculopathy, lumbar region: Secondary | ICD-10-CM

## 2020-07-12 ENCOUNTER — Other Ambulatory Visit: Payer: Medicare Other

## 2020-07-24 DIAGNOSIS — M48061 Spinal stenosis, lumbar region without neurogenic claudication: Secondary | ICD-10-CM | POA: Diagnosis not present

## 2020-07-24 DIAGNOSIS — M25561 Pain in right knee: Secondary | ICD-10-CM | POA: Diagnosis not present

## 2020-07-24 DIAGNOSIS — M1711 Unilateral primary osteoarthritis, right knee: Secondary | ICD-10-CM | POA: Diagnosis not present

## 2020-07-27 ENCOUNTER — Ambulatory Visit
Admission: RE | Admit: 2020-07-27 | Discharge: 2020-07-27 | Disposition: A | Discharge status: Home / Self Care | Payer: Medicare Other | Source: Ambulatory Visit | Attending: Internal Medicine | Admitting: Internal Medicine

## 2020-07-27 ENCOUNTER — Other Ambulatory Visit: Payer: Self-pay

## 2020-07-27 DIAGNOSIS — M5416 Radiculopathy, lumbar region: Secondary | ICD-10-CM

## 2020-07-27 DIAGNOSIS — M48061 Spinal stenosis, lumbar region without neurogenic claudication: Secondary | ICD-10-CM | POA: Diagnosis not present

## 2020-07-27 DIAGNOSIS — M545 Low back pain, unspecified: Secondary | ICD-10-CM | POA: Diagnosis not present

## 2020-07-30 ENCOUNTER — Other Ambulatory Visit: Payer: Medicare Other

## 2020-08-07 DIAGNOSIS — E1140 Type 2 diabetes mellitus with diabetic neuropathy, unspecified: Secondary | ICD-10-CM | POA: Diagnosis not present

## 2020-08-07 DIAGNOSIS — I10 Essential (primary) hypertension: Secondary | ICD-10-CM | POA: Diagnosis not present

## 2020-08-07 DIAGNOSIS — M810 Age-related osteoporosis without current pathological fracture: Secondary | ICD-10-CM | POA: Diagnosis not present

## 2020-08-07 DIAGNOSIS — E1165 Type 2 diabetes mellitus with hyperglycemia: Secondary | ICD-10-CM | POA: Diagnosis not present

## 2020-08-07 DIAGNOSIS — E1122 Type 2 diabetes mellitus with diabetic chronic kidney disease: Secondary | ICD-10-CM | POA: Diagnosis not present

## 2020-08-07 DIAGNOSIS — J45909 Unspecified asthma, uncomplicated: Secondary | ICD-10-CM | POA: Diagnosis not present

## 2020-08-07 DIAGNOSIS — E785 Hyperlipidemia, unspecified: Secondary | ICD-10-CM | POA: Diagnosis not present

## 2020-08-07 DIAGNOSIS — E1151 Type 2 diabetes mellitus with diabetic peripheral angiopathy without gangrene: Secondary | ICD-10-CM | POA: Diagnosis not present

## 2020-08-07 DIAGNOSIS — J4540 Moderate persistent asthma, uncomplicated: Secondary | ICD-10-CM | POA: Diagnosis not present

## 2020-08-07 DIAGNOSIS — N183 Chronic kidney disease, stage 3 unspecified: Secondary | ICD-10-CM | POA: Diagnosis not present

## 2020-08-07 DIAGNOSIS — D509 Iron deficiency anemia, unspecified: Secondary | ICD-10-CM | POA: Diagnosis not present

## 2020-08-07 DIAGNOSIS — K219 Gastro-esophageal reflux disease without esophagitis: Secondary | ICD-10-CM | POA: Diagnosis not present

## 2020-08-18 DIAGNOSIS — M4316 Spondylolisthesis, lumbar region: Secondary | ICD-10-CM | POA: Diagnosis not present

## 2020-08-18 DIAGNOSIS — M48062 Spinal stenosis, lumbar region with neurogenic claudication: Secondary | ICD-10-CM | POA: Diagnosis not present

## 2020-08-25 DIAGNOSIS — M48061 Spinal stenosis, lumbar region without neurogenic claudication: Secondary | ICD-10-CM | POA: Diagnosis not present

## 2020-08-25 DIAGNOSIS — I10 Essential (primary) hypertension: Secondary | ICD-10-CM | POA: Diagnosis not present

## 2020-09-10 ENCOUNTER — Other Ambulatory Visit: Payer: Self-pay | Admitting: Neurosurgery

## 2020-09-15 ENCOUNTER — Telehealth: Payer: Self-pay

## 2020-09-15 ENCOUNTER — Telehealth: Payer: Self-pay | Admitting: *Deleted

## 2020-09-15 DIAGNOSIS — M545 Low back pain, unspecified: Secondary | ICD-10-CM | POA: Diagnosis not present

## 2020-09-15 DIAGNOSIS — M48062 Spinal stenosis, lumbar region with neurogenic claudication: Secondary | ICD-10-CM | POA: Diagnosis not present

## 2020-09-15 DIAGNOSIS — M4316 Spondylolisthesis, lumbar region: Secondary | ICD-10-CM | POA: Diagnosis not present

## 2020-09-15 DIAGNOSIS — M4726 Other spondylosis with radiculopathy, lumbar region: Secondary | ICD-10-CM | POA: Diagnosis not present

## 2020-09-15 DIAGNOSIS — M4156 Other secondary scoliosis, lumbar region: Secondary | ICD-10-CM | POA: Diagnosis not present

## 2020-09-15 NOTE — Telephone Encounter (Signed)
Pt called in stating that she was at another doctors appt for a separate issue today and that she passed out.  She is calling to make an appt with [place].  Please advise  Webb Silversmith

## 2020-09-15 NOTE — Telephone Encounter (Signed)
Call received from patient stating that she passed out at her spinal MD office appt today and would like to make an appt to come in and see Dr. Marin Olp.  Informed pt to contact her PCP for appt. Regarding passing out.  Pt states that she will contact her PCP now.

## 2020-09-16 DIAGNOSIS — R8290 Unspecified abnormal findings in urine: Secondary | ICD-10-CM | POA: Diagnosis not present

## 2020-09-16 DIAGNOSIS — D649 Anemia, unspecified: Secondary | ICD-10-CM | POA: Diagnosis not present

## 2020-09-16 DIAGNOSIS — I951 Orthostatic hypotension: Secondary | ICD-10-CM | POA: Diagnosis not present

## 2020-09-16 DIAGNOSIS — R55 Syncope and collapse: Secondary | ICD-10-CM | POA: Diagnosis not present

## 2020-09-17 DIAGNOSIS — M48062 Spinal stenosis, lumbar region with neurogenic claudication: Secondary | ICD-10-CM | POA: Diagnosis not present

## 2020-09-17 DIAGNOSIS — M4316 Spondylolisthesis, lumbar region: Secondary | ICD-10-CM | POA: Diagnosis not present

## 2020-09-17 DIAGNOSIS — M4156 Other secondary scoliosis, lumbar region: Secondary | ICD-10-CM | POA: Diagnosis not present

## 2020-09-17 DIAGNOSIS — M5416 Radiculopathy, lumbar region: Secondary | ICD-10-CM | POA: Diagnosis not present

## 2020-09-21 DIAGNOSIS — M48062 Spinal stenosis, lumbar region with neurogenic claudication: Secondary | ICD-10-CM | POA: Diagnosis not present

## 2020-09-21 DIAGNOSIS — D649 Anemia, unspecified: Secondary | ICD-10-CM | POA: Diagnosis not present

## 2020-09-23 DIAGNOSIS — G8929 Other chronic pain: Secondary | ICD-10-CM | POA: Diagnosis not present

## 2020-09-23 DIAGNOSIS — M5442 Lumbago with sciatica, left side: Secondary | ICD-10-CM | POA: Diagnosis not present

## 2020-09-23 DIAGNOSIS — I10 Essential (primary) hypertension: Secondary | ICD-10-CM | POA: Diagnosis not present

## 2020-09-23 DIAGNOSIS — R7989 Other specified abnormal findings of blood chemistry: Secondary | ICD-10-CM | POA: Diagnosis not present

## 2020-09-23 DIAGNOSIS — M5441 Lumbago with sciatica, right side: Secondary | ICD-10-CM | POA: Diagnosis not present

## 2020-09-30 DIAGNOSIS — M48062 Spinal stenosis, lumbar region with neurogenic claudication: Secondary | ICD-10-CM | POA: Diagnosis not present

## 2020-09-30 DIAGNOSIS — M4316 Spondylolisthesis, lumbar region: Secondary | ICD-10-CM | POA: Diagnosis not present

## 2020-09-30 DIAGNOSIS — M4156 Other secondary scoliosis, lumbar region: Secondary | ICD-10-CM | POA: Diagnosis not present

## 2020-10-06 DIAGNOSIS — M48062 Spinal stenosis, lumbar region with neurogenic claudication: Secondary | ICD-10-CM | POA: Diagnosis not present

## 2020-10-06 DIAGNOSIS — E1122 Type 2 diabetes mellitus with diabetic chronic kidney disease: Secondary | ICD-10-CM | POA: Diagnosis not present

## 2020-10-06 DIAGNOSIS — E1151 Type 2 diabetes mellitus with diabetic peripheral angiopathy without gangrene: Secondary | ICD-10-CM | POA: Diagnosis not present

## 2020-10-06 DIAGNOSIS — R7989 Other specified abnormal findings of blood chemistry: Secondary | ICD-10-CM | POA: Diagnosis not present

## 2020-10-06 DIAGNOSIS — I10 Essential (primary) hypertension: Secondary | ICD-10-CM | POA: Diagnosis not present

## 2020-10-06 DIAGNOSIS — E1140 Type 2 diabetes mellitus with diabetic neuropathy, unspecified: Secondary | ICD-10-CM | POA: Diagnosis not present

## 2020-10-06 DIAGNOSIS — Z7984 Long term (current) use of oral hypoglycemic drugs: Secondary | ICD-10-CM | POA: Diagnosis not present

## 2020-10-12 ENCOUNTER — Inpatient Hospital Stay: Admit: 2020-10-12 | Payer: Medicare Other | Admitting: Neurosurgery

## 2020-10-12 SURGERY — POSTERIOR LUMBAR FUSION 1 LEVEL
Anesthesia: General

## 2020-10-15 DIAGNOSIS — K219 Gastro-esophageal reflux disease without esophagitis: Secondary | ICD-10-CM | POA: Diagnosis not present

## 2020-10-15 DIAGNOSIS — E1151 Type 2 diabetes mellitus with diabetic peripheral angiopathy without gangrene: Secondary | ICD-10-CM | POA: Diagnosis not present

## 2020-10-15 DIAGNOSIS — D509 Iron deficiency anemia, unspecified: Secondary | ICD-10-CM | POA: Diagnosis not present

## 2020-10-15 DIAGNOSIS — E1122 Type 2 diabetes mellitus with diabetic chronic kidney disease: Secondary | ICD-10-CM | POA: Diagnosis not present

## 2020-10-15 DIAGNOSIS — N183 Chronic kidney disease, stage 3 unspecified: Secondary | ICD-10-CM | POA: Diagnosis not present

## 2020-10-15 DIAGNOSIS — J45909 Unspecified asthma, uncomplicated: Secondary | ICD-10-CM | POA: Diagnosis not present

## 2020-10-15 DIAGNOSIS — I10 Essential (primary) hypertension: Secondary | ICD-10-CM | POA: Diagnosis not present

## 2020-10-15 DIAGNOSIS — J40 Bronchitis, not specified as acute or chronic: Secondary | ICD-10-CM | POA: Diagnosis not present

## 2020-10-15 DIAGNOSIS — E1165 Type 2 diabetes mellitus with hyperglycemia: Secondary | ICD-10-CM | POA: Diagnosis not present

## 2020-10-15 DIAGNOSIS — E785 Hyperlipidemia, unspecified: Secondary | ICD-10-CM | POA: Diagnosis not present

## 2020-10-15 DIAGNOSIS — M810 Age-related osteoporosis without current pathological fracture: Secondary | ICD-10-CM | POA: Diagnosis not present

## 2020-10-25 DIAGNOSIS — M25552 Pain in left hip: Secondary | ICD-10-CM | POA: Diagnosis not present

## 2020-10-25 DIAGNOSIS — M25562 Pain in left knee: Secondary | ICD-10-CM | POA: Diagnosis not present

## 2020-10-25 DIAGNOSIS — W19XXXA Unspecified fall, initial encounter: Secondary | ICD-10-CM | POA: Diagnosis not present

## 2020-11-05 DIAGNOSIS — M48062 Spinal stenosis, lumbar region with neurogenic claudication: Secondary | ICD-10-CM | POA: Diagnosis not present

## 2020-11-05 DIAGNOSIS — M4156 Other secondary scoliosis, lumbar region: Secondary | ICD-10-CM | POA: Diagnosis not present

## 2020-11-05 DIAGNOSIS — M4316 Spondylolisthesis, lumbar region: Secondary | ICD-10-CM | POA: Diagnosis not present

## 2020-11-05 DIAGNOSIS — M4726 Other spondylosis with radiculopathy, lumbar region: Secondary | ICD-10-CM | POA: Diagnosis not present

## 2020-11-06 DIAGNOSIS — S335XXS Sprain of ligaments of lumbar spine, sequela: Secondary | ICD-10-CM | POA: Diagnosis not present

## 2020-11-16 DIAGNOSIS — J4540 Moderate persistent asthma, uncomplicated: Secondary | ICD-10-CM | POA: Diagnosis not present

## 2020-11-16 DIAGNOSIS — Z0000 Encounter for general adult medical examination without abnormal findings: Secondary | ICD-10-CM | POA: Diagnosis not present

## 2020-11-16 DIAGNOSIS — D509 Iron deficiency anemia, unspecified: Secondary | ICD-10-CM | POA: Diagnosis not present

## 2020-11-16 DIAGNOSIS — K219 Gastro-esophageal reflux disease without esophagitis: Secondary | ICD-10-CM | POA: Diagnosis not present

## 2020-11-16 DIAGNOSIS — E1140 Type 2 diabetes mellitus with diabetic neuropathy, unspecified: Secondary | ICD-10-CM | POA: Diagnosis not present

## 2020-11-16 DIAGNOSIS — J45909 Unspecified asthma, uncomplicated: Secondary | ICD-10-CM | POA: Diagnosis not present

## 2020-11-16 DIAGNOSIS — Z1389 Encounter for screening for other disorder: Secondary | ICD-10-CM | POA: Diagnosis not present

## 2020-11-16 DIAGNOSIS — Z7984 Long term (current) use of oral hypoglycemic drugs: Secondary | ICD-10-CM | POA: Diagnosis not present

## 2020-11-16 DIAGNOSIS — N183 Chronic kidney disease, stage 3 unspecified: Secondary | ICD-10-CM | POA: Diagnosis not present

## 2020-11-16 DIAGNOSIS — E1165 Type 2 diabetes mellitus with hyperglycemia: Secondary | ICD-10-CM | POA: Diagnosis not present

## 2020-11-16 DIAGNOSIS — E1122 Type 2 diabetes mellitus with diabetic chronic kidney disease: Secondary | ICD-10-CM | POA: Diagnosis not present

## 2020-11-16 DIAGNOSIS — E1151 Type 2 diabetes mellitus with diabetic peripheral angiopathy without gangrene: Secondary | ICD-10-CM | POA: Diagnosis not present

## 2020-11-27 DIAGNOSIS — E1140 Type 2 diabetes mellitus with diabetic neuropathy, unspecified: Secondary | ICD-10-CM | POA: Diagnosis not present

## 2020-11-27 DIAGNOSIS — R600 Localized edema: Secondary | ICD-10-CM | POA: Diagnosis not present

## 2020-11-28 ENCOUNTER — Emergency Department (HOSPITAL_COMMUNITY)
Admission: EM | Admit: 2020-11-28 | Discharge: 2020-11-28 | Disposition: A | Discharge status: Home / Self Care | Payer: Medicare Other | Attending: Emergency Medicine | Admitting: Emergency Medicine

## 2020-11-28 ENCOUNTER — Emergency Department (HOSPITAL_BASED_OUTPATIENT_CLINIC_OR_DEPARTMENT_OTHER)
Admission: EM | Admit: 2020-11-28 | Discharge: 2020-11-28 | Disposition: A | Discharge status: Home / Self Care | Payer: Medicare Other | Source: Home / Self Care

## 2020-11-28 DIAGNOSIS — E1122 Type 2 diabetes mellitus with diabetic chronic kidney disease: Secondary | ICD-10-CM | POA: Diagnosis not present

## 2020-11-28 DIAGNOSIS — I129 Hypertensive chronic kidney disease with stage 1 through stage 4 chronic kidney disease, or unspecified chronic kidney disease: Secondary | ICD-10-CM | POA: Insufficient documentation

## 2020-11-28 DIAGNOSIS — Z79899 Other long term (current) drug therapy: Secondary | ICD-10-CM | POA: Diagnosis not present

## 2020-11-28 DIAGNOSIS — M79662 Pain in left lower leg: Secondary | ICD-10-CM | POA: Diagnosis not present

## 2020-11-28 DIAGNOSIS — R2242 Localized swelling, mass and lump, left lower limb: Secondary | ICD-10-CM | POA: Insufficient documentation

## 2020-11-28 DIAGNOSIS — M79605 Pain in left leg: Secondary | ICD-10-CM | POA: Diagnosis not present

## 2020-11-28 DIAGNOSIS — E1142 Type 2 diabetes mellitus with diabetic polyneuropathy: Secondary | ICD-10-CM | POA: Diagnosis not present

## 2020-11-28 DIAGNOSIS — Z7984 Long term (current) use of oral hypoglycemic drugs: Secondary | ICD-10-CM | POA: Diagnosis not present

## 2020-11-28 DIAGNOSIS — J45909 Unspecified asthma, uncomplicated: Secondary | ICD-10-CM | POA: Diagnosis not present

## 2020-11-28 DIAGNOSIS — N183 Chronic kidney disease, stage 3 unspecified: Secondary | ICD-10-CM | POA: Diagnosis not present

## 2020-11-28 DIAGNOSIS — E785 Hyperlipidemia, unspecified: Secondary | ICD-10-CM | POA: Insufficient documentation

## 2020-11-28 DIAGNOSIS — Z7951 Long term (current) use of inhaled steroids: Secondary | ICD-10-CM | POA: Diagnosis not present

## 2020-11-28 DIAGNOSIS — E1151 Type 2 diabetes mellitus with diabetic peripheral angiopathy without gangrene: Secondary | ICD-10-CM | POA: Insufficient documentation

## 2020-11-28 DIAGNOSIS — Z96652 Presence of left artificial knee joint: Secondary | ICD-10-CM | POA: Diagnosis not present

## 2020-11-28 DIAGNOSIS — R609 Edema, unspecified: Secondary | ICD-10-CM | POA: Diagnosis not present

## 2020-11-28 DIAGNOSIS — M7989 Other specified soft tissue disorders: Secondary | ICD-10-CM

## 2020-11-28 DIAGNOSIS — Z7982 Long term (current) use of aspirin: Secondary | ICD-10-CM | POA: Diagnosis not present

## 2020-11-28 DIAGNOSIS — E1169 Type 2 diabetes mellitus with other specified complication: Secondary | ICD-10-CM | POA: Diagnosis not present

## 2020-11-28 LAB — COMPREHENSIVE METABOLIC PANEL
ALT: 13 U/L (ref 0–44)
AST: 21 U/L (ref 15–41)
Albumin: 4.2 g/dL (ref 3.5–5.0)
Alkaline Phosphatase: 37 U/L — ABNORMAL LOW (ref 38–126)
Anion gap: 9 (ref 5–15)
BUN: 22 mg/dL (ref 8–23)
CO2: 30 mmol/L (ref 22–32)
Calcium: 10.4 mg/dL — ABNORMAL HIGH (ref 8.9–10.3)
Chloride: 101 mmol/L (ref 98–111)
Creatinine, Ser: 0.96 mg/dL (ref 0.44–1.00)
GFR, Estimated: 58 mL/min — ABNORMAL LOW (ref >60)
Glucose, Bld: 175 mg/dL — ABNORMAL HIGH (ref 70–99)
Potassium: 4 mmol/L (ref 3.5–5.1)
Sodium: 140 mmol/L (ref 135–145)
Total Bilirubin: 0.4 mg/dL (ref 0.3–1.2)
Total Protein: 7.1 g/dL (ref 6.5–8.1)

## 2020-11-28 LAB — CBC WITH DIFFERENTIAL/PLATELET
Abs Immature Granulocytes: 0.05 10*3/uL (ref 0.00–0.07)
Basophils Absolute: 0 10*3/uL (ref 0.0–0.1)
Basophils Relative: 1 %
Eosinophils Absolute: 0.3 10*3/uL (ref 0.0–0.5)
Eosinophils Relative: 5 %
HCT: 38.3 % (ref 36.0–46.0)
Hemoglobin: 12.1 g/dL (ref 12.0–15.0)
Immature Granulocytes: 1 %
Lymphocytes Relative: 32 %
Lymphs Abs: 2.1 10*3/uL (ref 0.7–4.0)
MCH: 27.2 pg (ref 26.0–34.0)
MCHC: 31.6 g/dL (ref 30.0–36.0)
MCV: 86.1 fL (ref 80.0–100.0)
Monocytes Absolute: 0.5 10*3/uL (ref 0.1–1.0)
Monocytes Relative: 7 %
Neutro Abs: 3.4 10*3/uL (ref 1.7–7.7)
Neutrophils Relative %: 54 %
Platelets: 219 10*3/uL (ref 150–400)
RBC: 4.45 MIL/uL (ref 3.87–5.11)
RDW: 15.7 % — ABNORMAL HIGH (ref 11.5–15.5)
WBC: 6.4 10*3/uL (ref 4.0–10.5)
nRBC: 0 % (ref 0.0–0.2)

## 2020-11-28 NOTE — ED Provider Notes (Signed)
[facility] DEPT Provider Note   CSN: [id number] Arrival date & time: 11/28/20  1027     History Chief Complaint  Patient presents with  . DVT    Sara Holt is a 86 y.o. female.  HPI Patient is an 86 year old female with a medical history as noted below.  She presents to the emergency department due to atraumatic unilateral left leg swelling started about 2 to 3 days ago.  She states she was evaluated by her PCP and told to come to the emergency department for an ultrasound.  She states her symptoms have been waxing and waning.  She reports mild calf pain in the leg that occurred last night.  No pain currently.  No numbness or weakness.  No chest pain or shortness of breath.  No recent immobilization, long trips, surgeries, history of blood clots, hemoptysis, or oral estrogen use.    Past Medical History:  Diagnosis Date  . Allergy   . Anemia   . Arthritis    OA  . Asthma   . Carotid artery occlusion   . Diabetes mellitus    DIET CONTROLLED, NO MEDS  . Disc    problems in neck and back  . GERD (gastroesophageal reflux disease)   . Hyperlipidemia   . Hypertension   . Neuromuscular disorder ([place])    nerve problems with hands  . Osteoporosis   . Sleep apnea     Patient Active Problem List   Diagnosis Date Noted  . Unilateral primary osteoarthritis, right knee 06/22/2020  . Neck pain 04/12/2018  . Ulnar neuropathy of left upper extremity 03/16/2018  . Carotid artery occlusion 01/09/2017  . PAD (peripheral artery disease) ([place]) 01/09/2017  . RBBB 01/09/2017  . Shortness of breath 01/09/2017  . Essential hypertension 07/08/2016  . Reactive airway disease 06/10/2016  . Reactive airway disease with wheezing with acute exacerbation 06/10/2016  . Acute kidney injury superimposed on chronic kidney disease ([place]) 06/10/2016  . Elevated troponin 06/10/2016  . Chronic kidney disease, stage III (moderate) ([place]) 12/25/2015  . Type 2 diabetes  mellitus without complication ([place]) 50/93/2671  . Anemia of renal disease 08/17/2015  . Anemia, iron deficiency 01/22/2015  . Gait disorder 12/11/2014  . Diabetes, polyneuropathy ([place]) 12/11/2014  . CMC arthritis, thumb, degenerative 12/03/2014  . Near syncope 08/21/2014  . Numbness and tingling in right hand 03/04/2014  . [facility] Leg 03/04/2014  . Pain of right lower leg 03/04/2014  . Aftercare following surgery of the circulatory system, [place] 03/04/2014  . Brachial neuritis 12/08/2012  . Carpal tunnel syndrome 12/08/2012  . HLD (hyperlipidemia) 12/08/2012  . Arthritis, degenerative 12/08/2012  . Cervical spinal stenosis 12/08/2012  . Hyperlipidemia associated with type 2 diabetes mellitus ([place]) 12/08/2012  . Foot pain 09/21/2012  . Personal history of colonic polyps 09/12/2012  . Hemorrhage of rectum and anus 09/12/2012  . [place] 05/24/2012  . Presence of unspecified artificial knee joint 05/24/2012  . Stress fracture of foot 05/24/2012  . Encounter for postoperative carotid endarterectomy surveillance 08/26/2011    Past Surgical History:  Procedure Laterality Date  . CAROTID ARTERY ANGIOPLASTY    . CAROTID ENDARTERECTOMY  Jan. 16,2012   LEFT cea  . carpel tunnel     both hands  . COLONOSCOPY    . JOINT REPLACEMENT Left 2011   Knee  . KNEE ARTHROSCOPY Left 2010  . POLYPECTOMY       OB History   No obstetric history on file.  Family History  Problem Relation Age of Onset  . Diabetes Father   . Pneumonia Father   . Peripheral vascular disease Sister   . Colon cancer Maternal Grandmother   . Cancer Maternal Grandmother        Colon cancer  . Esophageal cancer Neg Hx   . Stomach cancer Neg Hx   . Rectal cancer Neg Hx     Social History   Tobacco Use  . Smoking status: Never Smoker  . Smokeless tobacco: Never Used  Vaping Use  . Vaping Use: Never used  Substance Use Topics  . Alcohol use: Yes    Comment: occassionally  . Drug use: No     Home Medications Prior to Admission medications   Medication Sig Start Date End Date Taking? Authorizing Provider  acetaminophen (TYLENOL) 325 MG tablet Take 650 mg by mouth every 6 (six) hours as needed.    [provider]  ammonium lactate (AMLACTIN) 12 % cream APPLY TOPICALLY TO THE AFFECTED AREA EVERY DAY AFTER THE SHOWER 07/08/19   [provider]  aspirin EC 81 MG tablet Take 81 mg by mouth daily.    [provider]  azelastine (ASTELIN) 0.1 % nasal spray U 1 SPR IEN BID PRN 02/14/17   [provider]  azithromycin (ZITHROMAX) 250 MG tablet TAKE 2 TABLETS BY MOUTH ON DAY 1 THEN TAKE 1 TABLET DAILY FOR THE NEXT 4 DAYS 05/29/20 05/29/21  Nuala Alpha A, PA-C  carvedilol (COREG) 12.5 MG tablet TAKE 1 TABLET BY MOUTH TWO  TIMES DAILY 09/14/16   [provider]  cholecalciferol (VITAMIN D) 1000 units tablet Take 1,000 Units by mouth daily.    [provider]  cyanocobalamin 1000 MCG tablet Take by mouth.    [provider]  CYMBALTA 60 MG capsule Take 1 tablet by mouth daily.  08/22/11   [provider]  desoximetasone (TOPICORT) 0.25 % cream Apply topically continuous as needed (skin allergy).  01/13/12   [provider]  diclofenac sodium (VOLTAREN) 1 % GEL Apply 4 g topically as needed (pain).     [provider]  fluticasone Asencion Islam) 50 MCG/ACT nasal spray  02/18/17   [provider]  fluticasone furoate-vilanterol (BREO ELLIPTA) 100-25 MCG/INH AEPB Inhale 1 puff into the lungs once.    [provider]  glimepiride (AMARYL) 4 MG tablet Take 4 mg by mouth.    [provider]  glucose blood test strip  10/23/17   [provider]  loratadine (CLARITIN) 10 MG tablet Take 10 mg by mouth. 11/29/16   [provider]  losartan-hydrochlorothiazide (HYZAAR) 50-12.5 MG tablet Take 1 tablet by mouth daily. 07/05/18   [provider]  Lutein 10 MG TABS Take by  mouth.    [provider]  metFORMIN (GLUCOPHAGE-XR) 500 MG 24 hr tablet Take 1 mg by mouth daily with breakfast.  05/01/17   [provider]  methocarbamol (ROBAXIN) 500 MG tablet Take 500 mg by mouth daily as needed. 07/25/18   [provider]  montelukast (SINGULAIR) 10 MG tablet  03/19/19   [provider]  omeprazole (PRILOSEC) 40 MG capsule Take 40 mg by mouth.    [provider]  ONE TOUCH ULTRA TEST test strip  06/18/14   [provider]  OVER THE COUNTER MEDICATION Place 1 drop into both eyes at bedtime. dry eyes med    [provider]  oxybutynin (DITROPAN) 5 MG tablet Take 5 mg by mouth daily.  06/14/13   [provider]  simvastatin (ZOCOR) 10 MG tablet Take 10 mg by mouth Daily.  06/06/11   [provider]    Allergies    Buprenorphine hcl, Morphine, and Sulfacetamide sodium  Review of Systems   Review of Systems  All other systems reviewed and are negative. Ten systems reviewed and are negative for acute change, except as noted in the HPI.   Physical Exam Updated Vital Signs BP (!) 177/83 (BP Location: Left Arm)   Pulse 66   Temp 98.2 F (36.8 C) (Oral)   Resp 18   Ht _0  (1.626 m)   Wt 77.1 kg   SpO2 94%   BMI 29.18 kg/m   Physical Exam Vitals and nursing note reviewed.  Constitutional:      General: She is not in acute distress.    Appearance: Normal appearance. She is not ill-appearing, toxic-appearing or diaphoretic.  HENT:     Head: Normocephalic and atraumatic.     Right Ear: External ear normal.     Left Ear: External ear normal.     Nose: Nose normal.     Mouth/Throat:     Mouth: Mucous membranes are moist.     Pharynx: Oropharynx is clear. No oropharyngeal exudate or posterior oropharyngeal erythema.  Eyes:     Extraocular Movements: Extraocular movements intact.  Cardiovascular:     Rate and Rhythm: Normal rate and regular rhythm.     Pulses: Normal pulses.     Heart  sounds: Normal heart sounds. No murmur heard. No friction rub. No gallop.   Pulmonary:     Effort: Pulmonary effort is normal. No respiratory distress.     Breath sounds: Normal breath sounds. No stridor. No wheezing, rhonchi or rales.  Abdominal:     General: Abdomen is flat.     Tenderness: There is no abdominal tenderness.  Musculoskeletal:        General: Tenderness present. Normal range of motion.     Cervical back: Normal range of motion and neck supple. No tenderness.     Right lower leg: No edema.     Left lower leg: Edema present.     Comments: 1+ edema noted in the left lower leg through the left ankle and foot.  Mild tenderness appreciated along the medial calf.  No palpable cords.  Palpable DP and PT pulses.  Skin:    General: Skin is warm and dry.  Neurological:     General: No focal deficit present.     Mental Status: She is alert and oriented to person, place, and time.  Psychiatric:        Mood and Affect: Mood normal.        Behavior: Behavior normal.    ED Results / Procedures / Treatments   Labs (all labs ordered are listed, but only abnormal results are displayed) Labs Reviewed  COMPREHENSIVE METABOLIC PANEL - Abnormal; Notable for the following components:      Result Value   Glucose, Bld 175 (*)    Calcium 10.4 (*)    Alkaline Phosphatase 37 (*)    GFR, Estimated 58 (*)    All other components within normal limits  CBC WITH DIFFERENTIAL/PLATELET - Abnormal; Notable for the following components:   RDW 15.7 (*)    All other components within normal limits   EKG None  Radiology VAS [place] LOWER EXTREMITY VENOUS (DVT) (ONLY [facility] & [facility])  Result Date: 11/28/2020  Lower Venous DVT Study Patient Name:  [name]  Date of Exam:   11/28/2020 Medical Rec #: [id number]          Accession #:    [id number] Date of Birth: 1935/01/01           Patient Gender: F Patient Age:   89P Exam Location:  [facility] Procedure:      VAS [place] LOWER EXTREMITY VENOUS (DVT)  Referring Phys: [id number] Rayna Sexton --------------------------------------------------------------------------------  Indications: Edema.  Risk Factors: None identified. Limitations: Poor ultrasound/tissue interface. Comparison Study: No prior studies. Performing Technologist: Oliver Hum RVT  Examination Guidelines: A complete evaluation includes B-mode imaging, spectral Doppler, color Doppler, and power Doppler as needed of all accessible portions of each vessel. Bilateral testing is considered an integral part of a complete examination. Limited examinations for reoccurring indications may be performed as noted. The reflux portion of the exam is performed with the patient in reverse Trendelenburg.  +-----+---------------+---------+-----------+----------+--------------+ RIGHTCompressibilityPhasicitySpontaneityPropertiesThrombus Aging +-----+---------------+---------+-----------+----------+--------------+ CFV  Full           Yes      Yes                                 +-----+---------------+---------+-----------+----------+--------------+   +---------+---------------+---------+-----------+----------+--------------+ LEFT     CompressibilityPhasicitySpontaneityPropertiesThrombus Aging +---------+---------------+---------+-----------+----------+--------------+ CFV      Full           Yes      Yes                                 +---------+---------------+---------+-----------+----------+--------------+ SFJ      Full                                                        +---------+---------------+---------+-----------+----------+--------------+ FV Prox  Full                                                        +---------+---------------+---------+-----------+----------+--------------+ FV Mid   Full                                                        +---------+---------------+---------+-----------+----------+--------------+ FV DistalFull                                                         +---------+---------------+---------+-----------+----------+--------------+ PFV      Full                                                        +---------+---------------+---------+-----------+----------+--------------+ POP      Full  Yes      Yes                                 +---------+---------------+---------+-----------+----------+--------------+ PTV      Full                                                        +---------+---------------+---------+-----------+----------+--------------+ PERO     Full                                                        +---------+---------------+---------+-----------+----------+--------------+    Summary: RIGHT: - No evidence of common femoral vein obstruction.  LEFT: - There is no evidence of deep vein thrombosis in the lower extremity.  - No cystic structure found in the popliteal fossa.  *See table(s) above for measurements and observations.    Preliminary     Procedures Procedures   Medications Ordered in ED Medications - No data to display  ED Course  I have reviewed the triage vital signs and the nursing notes.  Pertinent labs & imaging results that were available during my care of the patient were reviewed by me and considered in my medical decision making (see chart for details).    MDM Rules/Calculators/A&P                          Pt is a 86 y.o. female who presents to the ED due to left leg swelling over the last few days.  Labs: CBC with an RDW of 15.7. CMP with a glucose of 175, calcium of 10.4, alk phos of 37, GFR 58.  Imaging: Venous ultrasound of the left lower extremity shows no evidence of DVT.  No cystic structure noted in the popliteal fossa.  And the right leg shows no evidence of common femoral vein obstruction.  I, Rayna Sexton, PA-C, personally reviewed and evaluated these images and lab results as part of my medical  decision-making.  Patient has had reassuring lab work as well as venous ultrasound of the left leg.  Negative for DVT.  Upon further discussion she notes that she was recently started on Lyrica a few weeks ago and had the dosage increased.  This is possibly the cause of her peripheral edema, though this does not explain its unilateral nature.  Patient denies any chest pain or shortness of breath.  Doubt PE at this time.  She states she discussed this with her doctor and is going to decrease the dose of her Lyrica.  Also recommended compression stockings as well as elevation.  She states she has compression stockings at home.  We discussed return precautions at length.  Feel that she is stable for discharge at this time and she is agreeable.    Her questions were answered and she was amicable at the time of discharge.  Note: Portions of this report may have been transcribed using voice recognition software. Every effort was made to ensure accuracy; however, inadvertent computerized transcription errors may be present.   Final Clinical Impression(s) / ED Diagnoses Final diagnoses:  Left leg swelling   Rx / DC Orders ED Discharge Orders    None       Rayna Sexton, PA-C 11/28/20 1244    Sherwood Gambler, MD 11/28/20 [phone]

## 2020-11-28 NOTE — Progress Notes (Signed)
Left lower extremity venous duplex has been completed. Preliminary results can be found in CV Proc through chart review.  Results were given to [facility] PA.  11/28/20 12:28 PM Carlos Levering RVT

## 2020-11-28 NOTE — Discharge Instructions (Signed)
Please continue to wear compression stockings and elevate the leg.  If your symptoms persist next week I would recommend following up with your regular doctor.  If you develop worsening swelling, pain in the leg, chest pain, or shortness of breath, please come back to the emergency department for reevaluation.  It was a pleasure to meet you.

## 2020-11-28 NOTE — ED Triage Notes (Signed)
Patient reports her doctor sent her to get an ultrasound due to possible dvt in left leg. Patient denies pain

## 2020-12-14 DIAGNOSIS — M4726 Other spondylosis with radiculopathy, lumbar region: Secondary | ICD-10-CM | POA: Diagnosis not present

## 2020-12-15 DIAGNOSIS — E1122 Type 2 diabetes mellitus with diabetic chronic kidney disease: Secondary | ICD-10-CM | POA: Diagnosis not present

## 2020-12-15 DIAGNOSIS — M810 Age-related osteoporosis without current pathological fracture: Secondary | ICD-10-CM | POA: Diagnosis not present

## 2020-12-15 DIAGNOSIS — N183 Chronic kidney disease, stage 3 unspecified: Secondary | ICD-10-CM | POA: Diagnosis not present

## 2020-12-15 DIAGNOSIS — J45909 Unspecified asthma, uncomplicated: Secondary | ICD-10-CM | POA: Diagnosis not present

## 2020-12-15 DIAGNOSIS — I10 Essential (primary) hypertension: Secondary | ICD-10-CM | POA: Diagnosis not present

## 2020-12-15 DIAGNOSIS — K219 Gastro-esophageal reflux disease without esophagitis: Secondary | ICD-10-CM | POA: Diagnosis not present

## 2020-12-15 DIAGNOSIS — E1165 Type 2 diabetes mellitus with hyperglycemia: Secondary | ICD-10-CM | POA: Diagnosis not present

## 2020-12-15 DIAGNOSIS — J40 Bronchitis, not specified as acute or chronic: Secondary | ICD-10-CM | POA: Diagnosis not present

## 2020-12-15 DIAGNOSIS — E1151 Type 2 diabetes mellitus with diabetic peripheral angiopathy without gangrene: Secondary | ICD-10-CM | POA: Diagnosis not present

## 2020-12-15 DIAGNOSIS — E785 Hyperlipidemia, unspecified: Secondary | ICD-10-CM | POA: Diagnosis not present

## 2020-12-15 DIAGNOSIS — D509 Iron deficiency anemia, unspecified: Secondary | ICD-10-CM | POA: Diagnosis not present

## 2020-12-18 DIAGNOSIS — M4726 Other spondylosis with radiculopathy, lumbar region: Secondary | ICD-10-CM | POA: Diagnosis not present

## 2020-12-25 DIAGNOSIS — M4726 Other spondylosis with radiculopathy, lumbar region: Secondary | ICD-10-CM | POA: Diagnosis not present

## 2020-12-28 DIAGNOSIS — Z7984 Long term (current) use of oral hypoglycemic drugs: Secondary | ICD-10-CM | POA: Diagnosis not present

## 2020-12-28 DIAGNOSIS — R600 Localized edema: Secondary | ICD-10-CM | POA: Diagnosis not present

## 2020-12-28 DIAGNOSIS — E1122 Type 2 diabetes mellitus with diabetic chronic kidney disease: Secondary | ICD-10-CM | POA: Diagnosis not present

## 2020-12-28 DIAGNOSIS — E785 Hyperlipidemia, unspecified: Secondary | ICD-10-CM | POA: Diagnosis not present

## 2020-12-28 DIAGNOSIS — D509 Iron deficiency anemia, unspecified: Secondary | ICD-10-CM | POA: Diagnosis not present

## 2020-12-28 DIAGNOSIS — E1140 Type 2 diabetes mellitus with diabetic neuropathy, unspecified: Secondary | ICD-10-CM | POA: Diagnosis not present

## 2020-12-29 DIAGNOSIS — M4726 Other spondylosis with radiculopathy, lumbar region: Secondary | ICD-10-CM | POA: Diagnosis not present

## 2020-12-31 DIAGNOSIS — M4726 Other spondylosis with radiculopathy, lumbar region: Secondary | ICD-10-CM | POA: Diagnosis not present

## 2021-01-05 DIAGNOSIS — M4726 Other spondylosis with radiculopathy, lumbar region: Secondary | ICD-10-CM | POA: Diagnosis not present

## 2021-01-07 DIAGNOSIS — M4726 Other spondylosis with radiculopathy, lumbar region: Secondary | ICD-10-CM | POA: Diagnosis not present

## 2021-01-12 DIAGNOSIS — M4726 Other spondylosis with radiculopathy, lumbar region: Secondary | ICD-10-CM | POA: Diagnosis not present

## 2021-01-14 DIAGNOSIS — M4726 Other spondylosis with radiculopathy, lumbar region: Secondary | ICD-10-CM | POA: Diagnosis not present

## 2021-01-19 DIAGNOSIS — M4726 Other spondylosis with radiculopathy, lumbar region: Secondary | ICD-10-CM | POA: Diagnosis not present

## 2021-01-21 DIAGNOSIS — M4726 Other spondylosis with radiculopathy, lumbar region: Secondary | ICD-10-CM | POA: Diagnosis not present

## 2021-01-26 DIAGNOSIS — M4726 Other spondylosis with radiculopathy, lumbar region: Secondary | ICD-10-CM | POA: Diagnosis not present

## 2021-01-28 DIAGNOSIS — M4726 Other spondylosis with radiculopathy, lumbar region: Secondary | ICD-10-CM | POA: Diagnosis not present

## 2021-02-11 DIAGNOSIS — M4726 Other spondylosis with radiculopathy, lumbar region: Secondary | ICD-10-CM | POA: Diagnosis not present

## 2021-02-11 DIAGNOSIS — M4316 Spondylolisthesis, lumbar region: Secondary | ICD-10-CM | POA: Diagnosis not present

## 2021-02-11 DIAGNOSIS — M4156 Other secondary scoliosis, lumbar region: Secondary | ICD-10-CM | POA: Diagnosis not present

## 2021-02-11 DIAGNOSIS — M48062 Spinal stenosis, lumbar region with neurogenic claudication: Secondary | ICD-10-CM | POA: Diagnosis not present

## 2021-02-17 DIAGNOSIS — M4726 Other spondylosis with radiculopathy, lumbar region: Secondary | ICD-10-CM | POA: Diagnosis not present

## 2021-02-23 DIAGNOSIS — M4726 Other spondylosis with radiculopathy, lumbar region: Secondary | ICD-10-CM | POA: Diagnosis not present

## 2021-02-26 DIAGNOSIS — N183 Chronic kidney disease, stage 3 unspecified: Secondary | ICD-10-CM | POA: Diagnosis not present

## 2021-02-26 DIAGNOSIS — I10 Essential (primary) hypertension: Secondary | ICD-10-CM | POA: Diagnosis not present

## 2021-02-26 DIAGNOSIS — E1140 Type 2 diabetes mellitus with diabetic neuropathy, unspecified: Secondary | ICD-10-CM | POA: Diagnosis not present

## 2021-02-26 DIAGNOSIS — M4726 Other spondylosis with radiculopathy, lumbar region: Secondary | ICD-10-CM | POA: Diagnosis not present

## 2021-02-26 DIAGNOSIS — M810 Age-related osteoporosis without current pathological fracture: Secondary | ICD-10-CM | POA: Diagnosis not present

## 2021-02-26 DIAGNOSIS — J40 Bronchitis, not specified as acute or chronic: Secondary | ICD-10-CM | POA: Diagnosis not present

## 2021-02-26 DIAGNOSIS — E785 Hyperlipidemia, unspecified: Secondary | ICD-10-CM | POA: Diagnosis not present

## 2021-02-26 DIAGNOSIS — K219 Gastro-esophageal reflux disease without esophagitis: Secondary | ICD-10-CM | POA: Diagnosis not present

## 2021-02-26 DIAGNOSIS — E1151 Type 2 diabetes mellitus with diabetic peripheral angiopathy without gangrene: Secondary | ICD-10-CM | POA: Diagnosis not present

## 2021-02-26 DIAGNOSIS — J45909 Unspecified asthma, uncomplicated: Secondary | ICD-10-CM | POA: Diagnosis not present

## 2021-02-26 DIAGNOSIS — E1122 Type 2 diabetes mellitus with diabetic chronic kidney disease: Secondary | ICD-10-CM | POA: Diagnosis not present

## 2021-02-26 DIAGNOSIS — E1165 Type 2 diabetes mellitus with hyperglycemia: Secondary | ICD-10-CM | POA: Diagnosis not present

## 2021-03-01 DIAGNOSIS — E1122 Type 2 diabetes mellitus with diabetic chronic kidney disease: Secondary | ICD-10-CM | POA: Diagnosis not present

## 2021-03-01 DIAGNOSIS — E1140 Type 2 diabetes mellitus with diabetic neuropathy, unspecified: Secondary | ICD-10-CM | POA: Diagnosis not present

## 2021-03-01 DIAGNOSIS — R600 Localized edema: Secondary | ICD-10-CM | POA: Diagnosis not present

## 2021-03-01 DIAGNOSIS — Z7984 Long term (current) use of oral hypoglycemic drugs: Secondary | ICD-10-CM | POA: Diagnosis not present

## 2021-03-01 DIAGNOSIS — M5441 Lumbago with sciatica, right side: Secondary | ICD-10-CM | POA: Diagnosis not present

## 2021-03-02 DIAGNOSIS — M4726 Other spondylosis with radiculopathy, lumbar region: Secondary | ICD-10-CM | POA: Diagnosis not present

## 2021-03-09 DIAGNOSIS — M4726 Other spondylosis with radiculopathy, lumbar region: Secondary | ICD-10-CM | POA: Diagnosis not present

## 2021-03-12 DIAGNOSIS — M4726 Other spondylosis with radiculopathy, lumbar region: Secondary | ICD-10-CM | POA: Diagnosis not present

## 2021-03-16 DIAGNOSIS — M4726 Other spondylosis with radiculopathy, lumbar region: Secondary | ICD-10-CM | POA: Diagnosis not present

## 2021-03-16 DIAGNOSIS — E1122 Type 2 diabetes mellitus with diabetic chronic kidney disease: Secondary | ICD-10-CM | POA: Diagnosis not present

## 2021-03-16 DIAGNOSIS — Z23 Encounter for immunization: Secondary | ICD-10-CM | POA: Diagnosis not present

## 2021-03-16 DIAGNOSIS — E1140 Type 2 diabetes mellitus with diabetic neuropathy, unspecified: Secondary | ICD-10-CM | POA: Diagnosis not present

## 2021-03-16 DIAGNOSIS — Z7984 Long term (current) use of oral hypoglycemic drugs: Secondary | ICD-10-CM | POA: Diagnosis not present

## 2021-03-16 DIAGNOSIS — I10 Essential (primary) hypertension: Secondary | ICD-10-CM | POA: Diagnosis not present

## 2021-03-16 DIAGNOSIS — M5441 Lumbago with sciatica, right side: Secondary | ICD-10-CM | POA: Diagnosis not present

## 2021-03-19 DIAGNOSIS — M4726 Other spondylosis with radiculopathy, lumbar region: Secondary | ICD-10-CM | POA: Diagnosis not present

## 2021-03-23 DIAGNOSIS — M4726 Other spondylosis with radiculopathy, lumbar region: Secondary | ICD-10-CM | POA: Diagnosis not present

## 2021-03-26 DIAGNOSIS — M4726 Other spondylosis with radiculopathy, lumbar region: Secondary | ICD-10-CM | POA: Diagnosis not present

## 2021-03-30 ENCOUNTER — Other Ambulatory Visit: Payer: Self-pay

## 2021-03-30 DIAGNOSIS — M810 Age-related osteoporosis without current pathological fracture: Secondary | ICD-10-CM | POA: Insufficient documentation

## 2021-03-30 DIAGNOSIS — E042 Nontoxic multinodular goiter: Secondary | ICD-10-CM | POA: Insufficient documentation

## 2021-03-30 DIAGNOSIS — E1140 Type 2 diabetes mellitus with diabetic neuropathy, unspecified: Secondary | ICD-10-CM | POA: Insufficient documentation

## 2021-03-30 DIAGNOSIS — G8929 Other chronic pain: Secondary | ICD-10-CM | POA: Insufficient documentation

## 2021-03-30 DIAGNOSIS — J4540 Moderate persistent asthma, uncomplicated: Secondary | ICD-10-CM | POA: Insufficient documentation

## 2021-03-30 DIAGNOSIS — M799 Soft tissue disorder, unspecified: Secondary | ICD-10-CM | POA: Insufficient documentation

## 2021-03-30 DIAGNOSIS — J309 Allergic rhinitis, unspecified: Secondary | ICD-10-CM | POA: Insufficient documentation

## 2021-03-30 DIAGNOSIS — R209 Unspecified disturbances of skin sensation: Secondary | ICD-10-CM | POA: Insufficient documentation

## 2021-03-30 DIAGNOSIS — I499 Cardiac arrhythmia, unspecified: Secondary | ICD-10-CM | POA: Insufficient documentation

## 2021-03-30 DIAGNOSIS — L820 Inflamed seborrheic keratosis: Secondary | ICD-10-CM | POA: Insufficient documentation

## 2021-03-30 DIAGNOSIS — I6523 Occlusion and stenosis of bilateral carotid arteries: Secondary | ICD-10-CM

## 2021-03-30 DIAGNOSIS — R32 Unspecified urinary incontinence: Secondary | ICD-10-CM | POA: Insufficient documentation

## 2021-03-30 DIAGNOSIS — J209 Acute bronchitis, unspecified: Secondary | ICD-10-CM | POA: Insufficient documentation

## 2021-03-30 DIAGNOSIS — R059 Cough, unspecified: Secondary | ICD-10-CM | POA: Insufficient documentation

## 2021-03-30 DIAGNOSIS — M5430 Sciatica, unspecified side: Secondary | ICD-10-CM | POA: Insufficient documentation

## 2021-03-30 DIAGNOSIS — M5136 Other intervertebral disc degeneration, lumbar region: Secondary | ICD-10-CM | POA: Insufficient documentation

## 2021-03-30 DIAGNOSIS — K219 Gastro-esophageal reflux disease without esophagitis: Secondary | ICD-10-CM | POA: Insufficient documentation

## 2021-03-30 DIAGNOSIS — Z8679 Personal history of other diseases of the circulatory system: Secondary | ICD-10-CM | POA: Insufficient documentation

## 2021-03-30 DIAGNOSIS — E559 Vitamin D deficiency, unspecified: Secondary | ICD-10-CM | POA: Insufficient documentation

## 2021-03-30 DIAGNOSIS — M51369 Other intervertebral disc degeneration, lumbar region without mention of lumbar back pain or lower extremity pain: Secondary | ICD-10-CM | POA: Insufficient documentation

## 2021-04-06 ENCOUNTER — Other Ambulatory Visit: Payer: Self-pay

## 2021-04-06 ENCOUNTER — Ambulatory Visit: Payer: Medicare Other | Admitting: Physician Assistant

## 2021-04-06 ENCOUNTER — Ambulatory Visit (HOSPITAL_COMMUNITY)
Admission: RE | Admit: 2021-04-06 | Discharge: 2021-04-06 | Disposition: A | Discharge status: Home / Self Care | Payer: Medicare Other | Source: Ambulatory Visit | Attending: Vascular Surgery | Admitting: Vascular Surgery

## 2021-04-06 VITALS — BP 121/69 | HR 77 | Temp 98.1°F | Resp 20 | Ht 64.0 in | Wt 159.2 lb

## 2021-04-06 DIAGNOSIS — I6523 Occlusion and stenosis of bilateral carotid arteries: Secondary | ICD-10-CM

## 2021-04-06 NOTE — Progress Notes (Signed)
Office Note     CC:  follow up Requesting Provider:  Josetta Huddle, MD  HPI: Sara Holt is a 86 y.o. (1935/01/15) female who presents for surveillance of carotid artery stenosis.  She underwent left carotid endarterectomy by Dr. Donnetta Hutching in 2012.  She denies any diagnosis of TIA or CVA since last office visit.  She also denies any strokelike symptoms including slurring speech, changes in vision, or one-sided weakness.  She is taking a statin daily.  She stopped taking a baby aspirin daily for no reason in particular.  She denies tobacco use.  She denies drop attacks and right arm claudication.  Past Medical History:  Diagnosis Date   Allergy    Anemia    Arthritis    OA   Asthma    Carotid artery occlusion    Diabetes mellitus    DIET CONTROLLED, NO MEDS   Disc    problems in neck and back   GERD (gastroesophageal reflux disease)    Hyperlipidemia    Hypertension    Neuromuscular disorder ([place])    nerve problems with hands   Osteoporosis    Sleep apnea     Past Surgical History:  Procedure Laterality Date   CAROTID ARTERY ANGIOPLASTY     CAROTID ENDARTERECTOMY  Jan. 16,2012   LEFT cea   carpel tunnel     both hands   COLONOSCOPY     JOINT REPLACEMENT Left 2011   Knee   KNEE ARTHROSCOPY Left 2010   POLYPECTOMY      Social History   Socioeconomic History   Marital status: Married    Spouse name: Not on file   Number of children: 2   Years of education: Not on file   Highest education level: Not on file  Occupational History   Occupation: Retired    Fish farm manager: RETIRED  Tobacco Use   Smoking status: Never   Smokeless tobacco: Never  Vaping Use   Vaping Use: Never used  Substance and Sexual Activity   Alcohol use: Yes    Comment: occassionally   Drug use: No   Sexual activity: Not on file  Other Topics Concern   Not on file  Social History Narrative   Not on file   Social Determinants of Health   Financial Resource Strain: Not on file  Food  Insecurity: Not on file  Transportation Needs: Not on file  Physical Activity: Not on file  Stress: Not on file  Social Connections: Not on file  Intimate Partner Violence: Not on file    Family History  Problem Relation Age of Onset   Diabetes Father    Pneumonia Father    Peripheral vascular disease Sister    Colon cancer Maternal Grandmother    Cancer Maternal Grandmother        Colon cancer   Esophageal cancer Neg Hx    Stomach cancer Neg Hx    Rectal cancer Neg Hx     Current Outpatient Medications  Medication Sig Dispense Refill   acetaminophen (TYLENOL) 325 MG tablet Take 650 mg by mouth every 6 (six) hours as needed.     ammonium lactate (AMLACTIN) 12 % cream APPLY TOPICALLY TO THE AFFECTED AREA EVERY DAY AFTER THE SHOWER     azelastine (ASTELIN) 0.1 % nasal spray U 1 SPR IEN BID PRN  5   azithromycin (ZITHROMAX) 250 MG tablet TAKE 2 TABLETS BY MOUTH ON DAY 1 THEN TAKE 1 TABLET DAILY FOR THE NEXT 4 DAYS 6  tablet 0   carvedilol (COREG) 12.5 MG tablet TAKE 1 TABLET BY MOUTH TWO  TIMES DAILY     chlorpheniramine-HYDROcodone (TUSSIONEX) 10-8 MG/5ML SUER SMARTSIG:5 Milliliter(s) By Mouth Every 12 Hours PRN     cholecalciferol (VITAMIN D) 1000 units tablet Take 1,000 Units by mouth daily.     cyanocobalamin 1000 MCG tablet Take by mouth.     CYMBALTA 60 MG capsule Take 1 tablet by mouth daily.      desoximetasone (TOPICORT) 0.25 % cream Apply topically continuous as needed (skin allergy).      diclofenac sodium (VOLTAREN) 1 % GEL Apply 4 g topically as needed (pain).      fluticasone (FLONASE) 50 MCG/ACT nasal spray      fluticasone furoate-vilanterol (BREO ELLIPTA) 100-25 MCG/INH AEPB Inhale 1 puff into the lungs once.     glimepiride (AMARYL) 4 MG tablet Take 4 mg by mouth.     glucose blood test strip      loratadine (CLARITIN) 10 MG tablet Take 10 mg by mouth.     losartan-hydrochlorothiazide (HYZAAR) 50-12.5 MG tablet Take 1 tablet by mouth daily.     Lutein 10 MG TABS  Take by mouth.     metFORMIN (GLUCOPHAGE-XR) 500 MG 24 hr tablet Take 1 mg by mouth daily with breakfast.      methocarbamol (ROBAXIN) 500 MG tablet Take 500 mg by mouth daily as needed.     montelukast (SINGULAIR) 10 MG tablet      omeprazole (PRILOSEC) 40 MG capsule Take 40 mg by mouth.     ONE TOUCH ULTRA TEST test strip   0   OVER THE COUNTER MEDICATION Place 1 drop into both eyes at bedtime. dry eyes med     oxybutynin (DITROPAN) 5 MG tablet Take 5 mg by mouth daily.     simvastatin (ZOCOR) 10 MG tablet Take 10 mg by mouth Daily.      aspirin EC 81 MG tablet Take 81 mg by mouth daily. (Patient not taking: Reported on 04/06/2021)     No current facility-administered medications for this visit.    Allergies  Allergen Reactions   Buprenorphine Hcl Nausea And Vomiting    Other reaction(s): Nausea And Vomiting nausea nausea Other reaction(s): Nausea And Vomiting   Morphine Nausea And Vomiting    Other reaction(s): Nausea And Vomiting Other reaction(s): Nausea And Vomiting   Sulfa Antibiotics Nausea And Vomiting    "deathly ill" Other reaction(s): vomiting   Sulfacetamide Sodium Nausea And Vomiting   Oxycodone Hcl     Other reaction(s): Hallucinations   Morphine Sulfate Other (See Comments)   Sulfamethoxazole Nausea Only     REVIEW OF SYSTEMS:   [X]  denotes positive finding, [ ]  denotes negative finding Cardiac  Comments:  Chest pain or chest pressure:    Shortness of breath upon exertion:    Short of breath when lying flat:    Irregular heart rhythm:        Vascular    Pain in calf, thigh, or hip brought on by ambulation:    Pain in feet at night that wakes you up from your sleep:     Blood clot in your veins:    Leg swelling:         Pulmonary    Oxygen at home:    Productive cough:     Wheezing:         Neurologic    Sudden weakness in arms or legs:     Sudden  numbness in arms or legs:     Sudden onset of difficulty speaking or slurred speech:    Temporary  loss of vision in one eye:     Problems with dizziness:         Gastrointestinal    Blood in stool:     Vomited blood:         Genitourinary    Burning when urinating:     Blood in urine:        Psychiatric    Major depression:         Hematologic    Bleeding problems:    Problems with blood clotting too easily:        Skin    Rashes or ulcers:        Constitutional    Fever or chills:      PHYSICAL EXAMINATION:  Vitals:   04/06/21 1459 04/06/21 1503  BP: (!) 153/71 121/69  Pulse: 77   Resp: 20   Temp: 98.1 F (36.7 C)   TempSrc: Temporal   SpO2: 95%   Weight: 159 lb 3.2 oz (72.2 kg)   Height: 5\' 4"  (1.626 m)     General:  WDWN in NAD; vital signs documented above Gait: Not observed HENT: WNL, normocephalic Pulmonary: normal non-labored breathing , without Rales, rhonchi,  wheezing Cardiac: regular HR Abdomen: soft, NT, no masses Skin: without rashes Vascular Exam/Pulses:  Right Left  Radial 2+ (normal) 2+ (normal)  DP absent 2+ (normal)  PT 2+ (normal) absent   Extremities: without ischemic changes, without Gangrene , without cellulitis; without open wounds;  Musculoskeletal: no muscle wasting or atrophy  Neurologic: A&O X 3;  CN grossly intact Psychiatric:  The pt has Normal affect.   Non-Invasive Vascular Imaging:   R ICA 60-79% stenosis L ICA 1-39% stenosis   ASSESSMENT/PLAN:: 86 y.o. female here for surveillance of carotid stenosis   - Subjectively, patient has been without neurological events or stroke symptoms since last office visit - Carotid duplex demonstrates 60-79% stenosis of R ICA and 1-39% stenosis of L ICA - Encouraged patient to restart 81mg  asa daily; continue statin daily - No indication for revascularization of asymptomatic R ICA stenosis; recheck duplex in 6 months   Dagoberto Ligas, PA-C [facility] [phone]  Clinic MD:   Carlis Abbott

## 2021-04-07 ENCOUNTER — Other Ambulatory Visit: Payer: Self-pay

## 2021-04-07 DIAGNOSIS — I6523 Occlusion and stenosis of bilateral carotid arteries: Secondary | ICD-10-CM

## 2021-04-29 DIAGNOSIS — M4156 Other secondary scoliosis, lumbar region: Secondary | ICD-10-CM | POA: Diagnosis not present

## 2021-04-29 DIAGNOSIS — M4726 Other spondylosis with radiculopathy, lumbar region: Secondary | ICD-10-CM | POA: Diagnosis not present

## 2021-04-29 DIAGNOSIS — M4802 Spinal stenosis, cervical region: Secondary | ICD-10-CM | POA: Diagnosis not present

## 2021-05-19 DIAGNOSIS — M4156 Other secondary scoliosis, lumbar region: Secondary | ICD-10-CM | POA: Diagnosis not present

## 2021-05-19 DIAGNOSIS — M4726 Other spondylosis with radiculopathy, lumbar region: Secondary | ICD-10-CM | POA: Diagnosis not present

## 2021-05-25 DIAGNOSIS — M4726 Other spondylosis with radiculopathy, lumbar region: Secondary | ICD-10-CM | POA: Diagnosis not present

## 2021-05-25 DIAGNOSIS — M4156 Other secondary scoliosis, lumbar region: Secondary | ICD-10-CM | POA: Diagnosis not present

## 2021-05-31 DIAGNOSIS — M4156 Other secondary scoliosis, lumbar region: Secondary | ICD-10-CM | POA: Diagnosis not present

## 2021-05-31 DIAGNOSIS — M4726 Other spondylosis with radiculopathy, lumbar region: Secondary | ICD-10-CM | POA: Diagnosis not present

## 2021-06-03 DIAGNOSIS — M4156 Other secondary scoliosis, lumbar region: Secondary | ICD-10-CM | POA: Diagnosis not present

## 2021-06-03 DIAGNOSIS — M4726 Other spondylosis with radiculopathy, lumbar region: Secondary | ICD-10-CM | POA: Diagnosis not present

## 2021-06-03 DIAGNOSIS — R2689 Other abnormalities of gait and mobility: Secondary | ICD-10-CM | POA: Diagnosis not present

## 2021-06-08 DIAGNOSIS — I10 Essential (primary) hypertension: Secondary | ICD-10-CM | POA: Diagnosis not present

## 2021-06-08 DIAGNOSIS — E1140 Type 2 diabetes mellitus with diabetic neuropathy, unspecified: Secondary | ICD-10-CM | POA: Diagnosis not present

## 2021-06-08 DIAGNOSIS — R2689 Other abnormalities of gait and mobility: Secondary | ICD-10-CM | POA: Diagnosis not present

## 2021-06-08 DIAGNOSIS — N183 Chronic kidney disease, stage 3 unspecified: Secondary | ICD-10-CM | POA: Diagnosis not present

## 2021-06-08 DIAGNOSIS — M4726 Other spondylosis with radiculopathy, lumbar region: Secondary | ICD-10-CM | POA: Diagnosis not present

## 2021-06-08 DIAGNOSIS — E782 Mixed hyperlipidemia: Secondary | ICD-10-CM | POA: Diagnosis not present

## 2021-06-08 DIAGNOSIS — M4156 Other secondary scoliosis, lumbar region: Secondary | ICD-10-CM | POA: Diagnosis not present

## 2021-06-08 DIAGNOSIS — E118 Type 2 diabetes mellitus with unspecified complications: Secondary | ICD-10-CM | POA: Diagnosis not present

## 2021-06-10 DIAGNOSIS — R2689 Other abnormalities of gait and mobility: Secondary | ICD-10-CM | POA: Diagnosis not present

## 2021-06-10 DIAGNOSIS — M4316 Spondylolisthesis, lumbar region: Secondary | ICD-10-CM | POA: Diagnosis not present

## 2021-06-10 DIAGNOSIS — M4726 Other spondylosis with radiculopathy, lumbar region: Secondary | ICD-10-CM | POA: Diagnosis not present

## 2021-06-10 DIAGNOSIS — M48062 Spinal stenosis, lumbar region with neurogenic claudication: Secondary | ICD-10-CM | POA: Diagnosis not present

## 2021-06-10 DIAGNOSIS — M4156 Other secondary scoliosis, lumbar region: Secondary | ICD-10-CM | POA: Diagnosis not present

## 2021-06-14 DIAGNOSIS — R2689 Other abnormalities of gait and mobility: Secondary | ICD-10-CM | POA: Diagnosis not present

## 2021-06-14 DIAGNOSIS — M4726 Other spondylosis with radiculopathy, lumbar region: Secondary | ICD-10-CM | POA: Diagnosis not present

## 2021-06-14 DIAGNOSIS — M4156 Other secondary scoliosis, lumbar region: Secondary | ICD-10-CM | POA: Diagnosis not present

## 2021-06-16 DIAGNOSIS — M4726 Other spondylosis with radiculopathy, lumbar region: Secondary | ICD-10-CM | POA: Diagnosis not present

## 2021-06-16 DIAGNOSIS — M4156 Other secondary scoliosis, lumbar region: Secondary | ICD-10-CM | POA: Diagnosis not present

## 2021-06-16 DIAGNOSIS — R2689 Other abnormalities of gait and mobility: Secondary | ICD-10-CM | POA: Diagnosis not present

## 2021-07-08 DIAGNOSIS — M4316 Spondylolisthesis, lumbar region: Secondary | ICD-10-CM | POA: Diagnosis not present

## 2021-07-08 DIAGNOSIS — M4156 Other secondary scoliosis, lumbar region: Secondary | ICD-10-CM | POA: Diagnosis not present

## 2021-07-08 DIAGNOSIS — M4726 Other spondylosis with radiculopathy, lumbar region: Secondary | ICD-10-CM | POA: Diagnosis not present

## 2021-07-08 DIAGNOSIS — M48062 Spinal stenosis, lumbar region with neurogenic claudication: Secondary | ICD-10-CM | POA: Diagnosis not present

## 2021-07-21 DIAGNOSIS — E1140 Type 2 diabetes mellitus with diabetic neuropathy, unspecified: Secondary | ICD-10-CM | POA: Diagnosis not present

## 2021-07-21 DIAGNOSIS — Z7984 Long term (current) use of oral hypoglycemic drugs: Secondary | ICD-10-CM | POA: Diagnosis not present

## 2021-07-21 DIAGNOSIS — I10 Essential (primary) hypertension: Secondary | ICD-10-CM | POA: Diagnosis not present

## 2021-07-21 DIAGNOSIS — E1122 Type 2 diabetes mellitus with diabetic chronic kidney disease: Secondary | ICD-10-CM | POA: Diagnosis not present

## 2021-07-21 DIAGNOSIS — M4692 Unspecified inflammatory spondylopathy, cervical region: Secondary | ICD-10-CM | POA: Diagnosis not present

## 2021-07-21 DIAGNOSIS — R1110 Vomiting, unspecified: Secondary | ICD-10-CM | POA: Diagnosis not present

## 2021-07-27 DIAGNOSIS — M4156 Other secondary scoliosis, lumbar region: Secondary | ICD-10-CM | POA: Diagnosis not present

## 2021-07-27 DIAGNOSIS — Z7409 Other reduced mobility: Secondary | ICD-10-CM | POA: Diagnosis not present

## 2021-07-27 DIAGNOSIS — M6281 Muscle weakness (generalized): Secondary | ICD-10-CM | POA: Diagnosis not present

## 2021-07-27 DIAGNOSIS — M48062 Spinal stenosis, lumbar region with neurogenic claudication: Secondary | ICD-10-CM | POA: Diagnosis not present

## 2021-07-27 DIAGNOSIS — R269 Unspecified abnormalities of gait and mobility: Secondary | ICD-10-CM | POA: Diagnosis not present

## 2021-07-29 DIAGNOSIS — M48062 Spinal stenosis, lumbar region with neurogenic claudication: Secondary | ICD-10-CM | POA: Diagnosis not present

## 2021-07-29 DIAGNOSIS — M4156 Other secondary scoliosis, lumbar region: Secondary | ICD-10-CM | POA: Diagnosis not present

## 2021-07-29 DIAGNOSIS — M4316 Spondylolisthesis, lumbar region: Secondary | ICD-10-CM | POA: Diagnosis not present

## 2021-07-29 DIAGNOSIS — M4726 Other spondylosis with radiculopathy, lumbar region: Secondary | ICD-10-CM | POA: Diagnosis not present

## 2021-08-03 DIAGNOSIS — M6281 Muscle weakness (generalized): Secondary | ICD-10-CM | POA: Diagnosis not present

## 2021-08-03 DIAGNOSIS — M4156 Other secondary scoliosis, lumbar region: Secondary | ICD-10-CM | POA: Diagnosis not present

## 2021-08-03 DIAGNOSIS — Z7409 Other reduced mobility: Secondary | ICD-10-CM | POA: Diagnosis not present

## 2021-08-03 DIAGNOSIS — M48062 Spinal stenosis, lumbar region with neurogenic claudication: Secondary | ICD-10-CM | POA: Diagnosis not present

## 2021-08-03 DIAGNOSIS — R269 Unspecified abnormalities of gait and mobility: Secondary | ICD-10-CM | POA: Diagnosis not present

## 2021-08-05 DIAGNOSIS — Z7409 Other reduced mobility: Secondary | ICD-10-CM | POA: Diagnosis not present

## 2021-08-05 DIAGNOSIS — M6281 Muscle weakness (generalized): Secondary | ICD-10-CM | POA: Diagnosis not present

## 2021-08-05 DIAGNOSIS — M48062 Spinal stenosis, lumbar region with neurogenic claudication: Secondary | ICD-10-CM | POA: Diagnosis not present

## 2021-08-05 DIAGNOSIS — R269 Unspecified abnormalities of gait and mobility: Secondary | ICD-10-CM | POA: Diagnosis not present

## 2021-08-10 DIAGNOSIS — Z7409 Other reduced mobility: Secondary | ICD-10-CM | POA: Diagnosis not present

## 2021-08-10 DIAGNOSIS — M48062 Spinal stenosis, lumbar region with neurogenic claudication: Secondary | ICD-10-CM | POA: Diagnosis not present

## 2021-08-10 DIAGNOSIS — M6281 Muscle weakness (generalized): Secondary | ICD-10-CM | POA: Diagnosis not present

## 2021-08-10 DIAGNOSIS — R269 Unspecified abnormalities of gait and mobility: Secondary | ICD-10-CM | POA: Diagnosis not present

## 2021-08-12 DIAGNOSIS — M6281 Muscle weakness (generalized): Secondary | ICD-10-CM | POA: Diagnosis not present

## 2021-08-12 DIAGNOSIS — Z7409 Other reduced mobility: Secondary | ICD-10-CM | POA: Diagnosis not present

## 2021-08-12 DIAGNOSIS — M48062 Spinal stenosis, lumbar region with neurogenic claudication: Secondary | ICD-10-CM | POA: Diagnosis not present

## 2021-08-12 DIAGNOSIS — R269 Unspecified abnormalities of gait and mobility: Secondary | ICD-10-CM | POA: Diagnosis not present

## 2021-08-16 DIAGNOSIS — Z7409 Other reduced mobility: Secondary | ICD-10-CM | POA: Diagnosis not present

## 2021-08-16 DIAGNOSIS — R269 Unspecified abnormalities of gait and mobility: Secondary | ICD-10-CM | POA: Diagnosis not present

## 2021-08-16 DIAGNOSIS — M48062 Spinal stenosis, lumbar region with neurogenic claudication: Secondary | ICD-10-CM | POA: Diagnosis not present

## 2021-08-16 DIAGNOSIS — M6281 Muscle weakness (generalized): Secondary | ICD-10-CM | POA: Diagnosis not present

## 2021-08-17 DIAGNOSIS — M5136 Other intervertebral disc degeneration, lumbar region: Secondary | ICD-10-CM | POA: Diagnosis not present

## 2021-08-17 DIAGNOSIS — M65321 Trigger finger, right index finger: Secondary | ICD-10-CM | POA: Diagnosis not present

## 2021-08-17 DIAGNOSIS — M159 Polyosteoarthritis, unspecified: Secondary | ICD-10-CM | POA: Diagnosis not present

## 2021-08-17 DIAGNOSIS — M542 Cervicalgia: Secondary | ICD-10-CM | POA: Diagnosis not present

## 2021-08-17 DIAGNOSIS — R7982 Elevated C-reactive protein (CRP): Secondary | ICD-10-CM | POA: Diagnosis not present

## 2021-08-17 DIAGNOSIS — G894 Chronic pain syndrome: Secondary | ICD-10-CM | POA: Diagnosis not present

## 2021-08-17 DIAGNOSIS — M4802 Spinal stenosis, cervical region: Secondary | ICD-10-CM | POA: Diagnosis not present

## 2021-08-19 DIAGNOSIS — Z7409 Other reduced mobility: Secondary | ICD-10-CM | POA: Diagnosis not present

## 2021-08-19 DIAGNOSIS — M48062 Spinal stenosis, lumbar region with neurogenic claudication: Secondary | ICD-10-CM | POA: Diagnosis not present

## 2021-08-19 DIAGNOSIS — M6281 Muscle weakness (generalized): Secondary | ICD-10-CM | POA: Diagnosis not present

## 2021-08-19 DIAGNOSIS — R269 Unspecified abnormalities of gait and mobility: Secondary | ICD-10-CM | POA: Diagnosis not present

## 2021-08-23 DIAGNOSIS — R269 Unspecified abnormalities of gait and mobility: Secondary | ICD-10-CM | POA: Diagnosis not present

## 2021-08-23 DIAGNOSIS — R1110 Vomiting, unspecified: Secondary | ICD-10-CM | POA: Diagnosis not present

## 2021-08-23 DIAGNOSIS — M6281 Muscle weakness (generalized): Secondary | ICD-10-CM | POA: Diagnosis not present

## 2021-08-23 DIAGNOSIS — E1165 Type 2 diabetes mellitus with hyperglycemia: Secondary | ICD-10-CM | POA: Diagnosis not present

## 2021-08-23 DIAGNOSIS — Z7984 Long term (current) use of oral hypoglycemic drugs: Secondary | ICD-10-CM | POA: Diagnosis not present

## 2021-08-23 DIAGNOSIS — M4692 Unspecified inflammatory spondylopathy, cervical region: Secondary | ICD-10-CM | POA: Diagnosis not present

## 2021-08-23 DIAGNOSIS — M5441 Lumbago with sciatica, right side: Secondary | ICD-10-CM | POA: Diagnosis not present

## 2021-08-23 DIAGNOSIS — I10 Essential (primary) hypertension: Secondary | ICD-10-CM | POA: Diagnosis not present

## 2021-08-23 DIAGNOSIS — Z7409 Other reduced mobility: Secondary | ICD-10-CM | POA: Diagnosis not present

## 2021-08-23 DIAGNOSIS — M48062 Spinal stenosis, lumbar region with neurogenic claudication: Secondary | ICD-10-CM | POA: Diagnosis not present

## 2021-08-25 DIAGNOSIS — M6281 Muscle weakness (generalized): Secondary | ICD-10-CM | POA: Diagnosis not present

## 2021-08-25 DIAGNOSIS — R269 Unspecified abnormalities of gait and mobility: Secondary | ICD-10-CM | POA: Diagnosis not present

## 2021-08-25 DIAGNOSIS — Z7409 Other reduced mobility: Secondary | ICD-10-CM | POA: Diagnosis not present

## 2021-08-25 DIAGNOSIS — M48062 Spinal stenosis, lumbar region with neurogenic claudication: Secondary | ICD-10-CM | POA: Diagnosis not present

## 2021-09-06 DIAGNOSIS — M48062 Spinal stenosis, lumbar region with neurogenic claudication: Secondary | ICD-10-CM | POA: Diagnosis not present

## 2021-09-06 DIAGNOSIS — Z7409 Other reduced mobility: Secondary | ICD-10-CM | POA: Diagnosis not present

## 2021-09-06 DIAGNOSIS — M6281 Muscle weakness (generalized): Secondary | ICD-10-CM | POA: Diagnosis not present

## 2021-09-06 DIAGNOSIS — R269 Unspecified abnormalities of gait and mobility: Secondary | ICD-10-CM | POA: Diagnosis not present

## 2021-09-08 DIAGNOSIS — Z7409 Other reduced mobility: Secondary | ICD-10-CM | POA: Diagnosis not present

## 2021-09-08 DIAGNOSIS — M48062 Spinal stenosis, lumbar region with neurogenic claudication: Secondary | ICD-10-CM | POA: Diagnosis not present

## 2021-09-08 DIAGNOSIS — M6281 Muscle weakness (generalized): Secondary | ICD-10-CM | POA: Diagnosis not present

## 2021-09-08 DIAGNOSIS — R269 Unspecified abnormalities of gait and mobility: Secondary | ICD-10-CM | POA: Diagnosis not present

## 2021-09-13 DIAGNOSIS — M48062 Spinal stenosis, lumbar region with neurogenic claudication: Secondary | ICD-10-CM | POA: Diagnosis not present

## 2021-09-13 DIAGNOSIS — M6281 Muscle weakness (generalized): Secondary | ICD-10-CM | POA: Diagnosis not present

## 2021-09-13 DIAGNOSIS — R269 Unspecified abnormalities of gait and mobility: Secondary | ICD-10-CM | POA: Diagnosis not present

## 2021-09-13 DIAGNOSIS — Z7409 Other reduced mobility: Secondary | ICD-10-CM | POA: Diagnosis not present

## 2021-09-15 DIAGNOSIS — R269 Unspecified abnormalities of gait and mobility: Secondary | ICD-10-CM | POA: Diagnosis not present

## 2021-09-15 DIAGNOSIS — M6281 Muscle weakness (generalized): Secondary | ICD-10-CM | POA: Diagnosis not present

## 2021-09-15 DIAGNOSIS — Z7409 Other reduced mobility: Secondary | ICD-10-CM | POA: Diagnosis not present

## 2021-09-15 DIAGNOSIS — M48062 Spinal stenosis, lumbar region with neurogenic claudication: Secondary | ICD-10-CM | POA: Diagnosis not present

## 2021-09-22 DIAGNOSIS — M48062 Spinal stenosis, lumbar region with neurogenic claudication: Secondary | ICD-10-CM | POA: Diagnosis not present

## 2021-09-22 DIAGNOSIS — R269 Unspecified abnormalities of gait and mobility: Secondary | ICD-10-CM | POA: Diagnosis not present

## 2021-09-22 DIAGNOSIS — Z7409 Other reduced mobility: Secondary | ICD-10-CM | POA: Diagnosis not present

## 2021-09-22 DIAGNOSIS — M6281 Muscle weakness (generalized): Secondary | ICD-10-CM | POA: Diagnosis not present

## 2021-09-24 DIAGNOSIS — M6281 Muscle weakness (generalized): Secondary | ICD-10-CM | POA: Diagnosis not present

## 2021-09-24 DIAGNOSIS — Z7409 Other reduced mobility: Secondary | ICD-10-CM | POA: Diagnosis not present

## 2021-09-24 DIAGNOSIS — M48062 Spinal stenosis, lumbar region with neurogenic claudication: Secondary | ICD-10-CM | POA: Diagnosis not present

## 2021-09-24 DIAGNOSIS — R269 Unspecified abnormalities of gait and mobility: Secondary | ICD-10-CM | POA: Diagnosis not present

## 2021-09-27 DIAGNOSIS — M6281 Muscle weakness (generalized): Secondary | ICD-10-CM | POA: Diagnosis not present

## 2021-09-27 DIAGNOSIS — Z7409 Other reduced mobility: Secondary | ICD-10-CM | POA: Diagnosis not present

## 2021-09-27 DIAGNOSIS — R269 Unspecified abnormalities of gait and mobility: Secondary | ICD-10-CM | POA: Diagnosis not present

## 2021-09-27 DIAGNOSIS — M48062 Spinal stenosis, lumbar region with neurogenic claudication: Secondary | ICD-10-CM | POA: Diagnosis not present

## 2021-09-29 DIAGNOSIS — R269 Unspecified abnormalities of gait and mobility: Secondary | ICD-10-CM | POA: Diagnosis not present

## 2021-09-29 DIAGNOSIS — M6281 Muscle weakness (generalized): Secondary | ICD-10-CM | POA: Diagnosis not present

## 2021-09-29 DIAGNOSIS — Z7409 Other reduced mobility: Secondary | ICD-10-CM | POA: Diagnosis not present

## 2021-09-29 DIAGNOSIS — M48062 Spinal stenosis, lumbar region with neurogenic claudication: Secondary | ICD-10-CM | POA: Diagnosis not present

## 2021-10-04 ENCOUNTER — Ambulatory Visit: Payer: Medicare Other | Admitting: Physician Assistant

## 2021-10-04 ENCOUNTER — Ambulatory Visit (HOSPITAL_COMMUNITY)
Admission: RE | Admit: 2021-10-04 | Discharge: 2021-10-04 | Disposition: A | Discharge status: Home / Self Care | Payer: Medicare Other | Source: Ambulatory Visit | Attending: Physician Assistant | Admitting: Physician Assistant

## 2021-10-04 VITALS — BP 158/74 | HR 72 | Temp 97.9°F | Resp 20 | Ht 64.0 in | Wt 154.4 lb

## 2021-10-04 DIAGNOSIS — Z7409 Other reduced mobility: Secondary | ICD-10-CM | POA: Diagnosis not present

## 2021-10-04 DIAGNOSIS — M6281 Muscle weakness (generalized): Secondary | ICD-10-CM | POA: Diagnosis not present

## 2021-10-04 DIAGNOSIS — I6523 Occlusion and stenosis of bilateral carotid arteries: Secondary | ICD-10-CM

## 2021-10-04 DIAGNOSIS — R269 Unspecified abnormalities of gait and mobility: Secondary | ICD-10-CM | POA: Diagnosis not present

## 2021-10-04 DIAGNOSIS — M48062 Spinal stenosis, lumbar region with neurogenic claudication: Secondary | ICD-10-CM | POA: Diagnosis not present

## 2021-10-04 NOTE — Progress Notes (Signed)
? ? ? ?History of Present Illness:  Patient is a 87 y.o. year old female who presents for evaluation of carotid stenosis.  She has a history of left symptomatic carotid with amaurosis and underwent left CEA by Dr. Donnetta Hutching in 2012.   ?The patient denies symptoms of TIA, amaurosis, aphasia or stroke.   ? ? ?Past Medical History:  ?Diagnosis Date  ? Allergy   ? Anemia   ? Arthritis   ? OA  ? Asthma   ? Carotid artery occlusion   ? Diabetes mellitus   ? DIET CONTROLLED, NO MEDS  ? Disc   ? problems in neck and back  ? GERD (gastroesophageal reflux disease)   ? Hyperlipidemia   ? Hypertension   ? Neuromuscular disorder ([place])   ? nerve problems with hands  ? Osteoporosis   ? Sleep apnea   ? ? ?Past Surgical History:  ?Procedure Laterality Date  ? CAROTID ARTERY ANGIOPLASTY    ? CAROTID ENDARTERECTOMY  Jan. 16,2012  ? LEFT cea  ? carpel tunnel    ? both hands  ? COLONOSCOPY    ? JOINT REPLACEMENT Left 2011  ? Knee  ? KNEE ARTHROSCOPY Left 2010  ? POLYPECTOMY    ? ? ? ?Social History ?Social History  ? ?Tobacco Use  ? Smoking status: Never  ?  Passive exposure: Never  ? Smokeless tobacco: Never  ?Vaping Use  ? Vaping Use: Never used  ?Substance Use Topics  ? Alcohol use: Yes  ?  Comment: occassionally  ? Drug use: No  ? ? ?Family History ?Family History  ?Problem Relation Age of Onset  ? Diabetes Father   ? Pneumonia Father   ? Peripheral vascular disease Sister   ? Colon cancer Maternal Grandmother   ? Cancer Maternal Grandmother   ?     Colon cancer  ? Esophageal cancer Neg Hx   ? Stomach cancer Neg Hx   ? Rectal cancer Neg Hx   ? ? ?Allergies ? ?Allergies  ?Allergen Reactions  ? Buprenorphine Hcl Nausea And Vomiting  ?  Other reaction(s): Nausea And Vomiting ?nausea ?nausea ?Other reaction(s): Nausea And Vomiting  ? Morphine Nausea And Vomiting  ?  Other reaction(s): Nausea And Vomiting ?Other reaction(s): Nausea And Vomiting  ? Sulfa Antibiotics Nausea And Vomiting  ?  "deathly ill" ?Other reaction(s): vomiting  ?  Sulfacetamide Sodium Nausea And Vomiting  ? Oxycodone Hcl   ?  Other reaction(s): Hallucinations  ? Morphine Sulfate Other (See Comments)  ? Tramadol Hcl   ?  Other reaction(s): pain  ? Sulfamethoxazole Nausea Only  ? ? ? ?Current Outpatient Medications  ?Medication Sig Dispense Refill  ? acetaminophen (TYLENOL) 325 MG tablet Take 650 mg by mouth every 6 (six) hours as needed.    ? amLODipine (NORVASC) 5 MG tablet Take 1 tablet by mouth daily.    ? ammonium lactate (AMLACTIN) 12 % cream APPLY TOPICALLY TO THE AFFECTED AREA EVERY DAY AFTER THE SHOWER    ? aspirin EC 81 MG tablet Take 81 mg by mouth daily.    ? azelastine (ASTELIN) 0.1 % nasal spray U 1 SPR IEN BID PRN  5  ? carvedilol (COREG) 12.5 MG tablet TAKE 1 TABLET BY MOUTH TWO  TIMES DAILY    ? chlorpheniramine-HYDROcodone (TUSSIONEX) 10-8 MG/5ML SUER SMARTSIG:5 Milliliter(s) By Mouth Every 12 Hours PRN    ? cholecalciferol (VITAMIN D) 1000 units tablet Take 1,000 Units by mouth daily.    ? cyanocobalamin 1000  MCG tablet Take by mouth.    ? CYMBALTA 60 MG capsule Take 1 tablet by mouth daily.     ? desoximetasone (TOPICORT) 0.25 % cream Apply topically continuous as needed (skin allergy).     ? diclofenac sodium (VOLTAREN) 1 % GEL Apply 4 g topically as needed (pain).     ? fluticasone (FLONASE) 50 MCG/ACT nasal spray     ? fluticasone furoate-vilanterol (BREO ELLIPTA) 100-25 MCG/INH AEPB Inhale 1 puff into the lungs once.    ? glimepiride (AMARYL) 4 MG tablet Take 4 mg by mouth.    ? glucose blood test strip     ? HYDROcodone-acetaminophen (NORCO/VICODIN) 5-325 MG tablet 1 tablet    ? loratadine (CLARITIN) 10 MG tablet Take 10 mg by mouth.    ? Lutein 10 MG TABS Take by mouth.    ? metFORMIN (GLUCOPHAGE-XR) 500 MG 24 hr tablet Take 1 mg by mouth daily with breakfast.     ? methocarbamol (ROBAXIN) 500 MG tablet Take 500 mg by mouth daily as needed.    ? montelukast (SINGULAIR) 10 MG tablet     ? omeprazole (PRILOSEC) 40 MG capsule Take 40 mg by mouth.    ? ONE  TOUCH ULTRA TEST test strip   0  ? OVER THE COUNTER MEDICATION Place 1 drop into both eyes at bedtime. dry eyes med    ? oxybutynin (DITROPAN) 5 MG tablet Take 5 mg by mouth daily.    ? simvastatin (ZOCOR) 10 MG tablet Take 10 mg by mouth Daily.     ? valsartan-hydrochlorothiazide (DIOVAN-HCT) 160-12.5 MG tablet 1 tablet    ? ?No current facility-administered medications for this visit.  ? ? ?ROS:  ? ?General:  No weight loss, Fever, chills ? ?HEENT: No recent headaches, no nasal bleeding, no visual changes, no sore throat ? ?Neurologic: No dizziness, blackouts, seizures. No recent symptoms of stroke or mini- stroke. No recent episodes of slurred speech, or temporary blindness. ? ?Cardiac: No recent episodes of chest pain/pressure, no shortness of breath at rest.  No shortness of breath with exertion.  Denies history of atrial fibrillation or irregular heartbeat ? ?Vascular: No history of rest pain in feet.  No history of claudication.  No history of non-healing ulcer, No history of DVT  ? ?Pulmonary: No home oxygen, no productive cough, no hemoptysis,  No asthma or wheezing ? ?Musculoskeletal:  '[ ]'$  Arthritis, '[ ]'$  Low back pain,  '[ ]'$  Joint pain ? ?Hematologic:No history of hypercoagulable state.  No history of easy bleeding.  No history of anemia ? ?Gastrointestinal: No hematochezia or melena,  No gastroesophageal reflux, no trouble swallowing ? ?Urinary: '[ ]'$  chronic Kidney disease, '[ ]'$  on HD - '[ ]'$  MWF or '[ ]'$  TTHS, '[ ]'$  Burning with urination, '[ ]'$  Frequent urination, '[ ]'$  Difficulty urinating;  ? ?Skin: No rashes ? ?Psychological: No history of anxiety,  No history of depression ? ? ?Physical Examination ? ?Vitals:  ? 10/04/21 1434 10/04/21 1439  ?BP: (!) 158/79 (!) 158/74  ?Pulse: 72   ?Resp: 20   ?Temp: 97.9 ?F (36.6 ?C)   ?TempSrc: Temporal   ?SpO2: 98%   ?Weight: 154 lb 6.4 oz (70 kg)   ?Height: '5\' 4"'$  (1.626 m)   ? ? ?Body mass index is 26.5 kg/m?. ? ?General:  Alert and oriented, no acute distress ?HEENT:  Normal ?Neck: No bruit or JVD ?Pulmonary: Clear to auscultation bilaterally ?Cardiac: Regular Rate and Rhythm without murmur ?Gastrointestinal: Soft, non-tender,  non-distended, no mass, no scars ?Skin: No rash ?Musculoskeletal: No deformity or edema  ?Neurologic: Upper and lower extremity motor 5/5 and symmetric ? ?DATA:  ?   ?Right Carotid Findings:  ?+----------+--------+--------+--------+----------------------+--------+  ?          PSV cm/sEDV cm/sStenosisPlaque Description    Comments  ?+----------+--------+--------+--------+----------------------+--------+  ?CCA Prox  60      0                                               ?+----------+--------+--------+--------+----------------------+--------+  ?CCA Mid   80      10      <50%    heterogenous                    ?+----------+--------+--------+--------+----------------------+--------+  ?CCA Distal83      15                                              ?+----------+--------+--------+--------+----------------------+--------+  ?ICA Prox  325     66      60-79%  irregular and calcific          ?+----------+--------+--------+--------+----------------------+--------+  ?ICA Mid   169     22                                              ?+----------+--------+--------+--------+----------------------+--------+  ?ICA Distal88      19                                              ?+----------+--------+--------+--------+----------------------+--------+  ?ECA       232     0       >50%    irregular and calcific          ?+----------+--------+--------+--------+----------------------+--------+  ? ?+----------+--------+-------+----------------+-------------------+  ?          PSV cm/sEDV cmsDescribe        Arm Pressure (mmHG)  ?+----------+--------+-------+----------------+-------------------+  ?[id number]      0      Multiphasic, WNL                      ?+----------+--------+-------+----------------+-------------------+  ? ?+---------+--------+--+--------+-+---------+  ?VertebralPSV cm/s41EDV cm/s8Antegrade  ?+---------+--------+--+--------+-+---------+  ? ?   ? ?Left Carotid Findings:  ?+----------+--------+--------+--------+--------------------------+--------+  ? ?          PSV cm/

## 2021-10-06 DIAGNOSIS — R269 Unspecified abnormalities of gait and mobility: Secondary | ICD-10-CM | POA: Diagnosis not present

## 2021-10-06 DIAGNOSIS — Z7409 Other reduced mobility: Secondary | ICD-10-CM | POA: Diagnosis not present

## 2021-10-06 DIAGNOSIS — M48062 Spinal stenosis, lumbar region with neurogenic claudication: Secondary | ICD-10-CM | POA: Diagnosis not present

## 2021-10-06 DIAGNOSIS — M6281 Muscle weakness (generalized): Secondary | ICD-10-CM | POA: Diagnosis not present

## 2021-10-11 DIAGNOSIS — R269 Unspecified abnormalities of gait and mobility: Secondary | ICD-10-CM | POA: Diagnosis not present

## 2021-10-11 DIAGNOSIS — M6281 Muscle weakness (generalized): Secondary | ICD-10-CM | POA: Diagnosis not present

## 2021-10-11 DIAGNOSIS — M48062 Spinal stenosis, lumbar region with neurogenic claudication: Secondary | ICD-10-CM | POA: Diagnosis not present

## 2021-10-11 DIAGNOSIS — Z7409 Other reduced mobility: Secondary | ICD-10-CM | POA: Diagnosis not present

## 2021-10-13 ENCOUNTER — Other Ambulatory Visit: Payer: Self-pay | Admitting: *Deleted

## 2021-10-13 DIAGNOSIS — I6523 Occlusion and stenosis of bilateral carotid arteries: Secondary | ICD-10-CM

## 2021-10-14 DIAGNOSIS — M48062 Spinal stenosis, lumbar region with neurogenic claudication: Secondary | ICD-10-CM | POA: Diagnosis not present

## 2021-10-14 DIAGNOSIS — M4316 Spondylolisthesis, lumbar region: Secondary | ICD-10-CM | POA: Diagnosis not present

## 2021-10-20 ENCOUNTER — Other Ambulatory Visit (HOSPITAL_COMMUNITY): Payer: Self-pay

## 2021-10-25 DIAGNOSIS — R269 Unspecified abnormalities of gait and mobility: Secondary | ICD-10-CM | POA: Diagnosis not present

## 2021-10-25 DIAGNOSIS — Z7409 Other reduced mobility: Secondary | ICD-10-CM | POA: Diagnosis not present

## 2021-10-25 DIAGNOSIS — M6281 Muscle weakness (generalized): Secondary | ICD-10-CM | POA: Diagnosis not present

## 2021-10-25 DIAGNOSIS — M48062 Spinal stenosis, lumbar region with neurogenic claudication: Secondary | ICD-10-CM | POA: Diagnosis not present

## 2021-11-01 DIAGNOSIS — M5441 Lumbago with sciatica, right side: Secondary | ICD-10-CM | POA: Diagnosis not present

## 2021-11-01 DIAGNOSIS — R1110 Vomiting, unspecified: Secondary | ICD-10-CM | POA: Diagnosis not present

## 2021-11-01 DIAGNOSIS — E1122 Type 2 diabetes mellitus with diabetic chronic kidney disease: Secondary | ICD-10-CM | POA: Diagnosis not present

## 2021-11-01 DIAGNOSIS — M4692 Unspecified inflammatory spondylopathy, cervical region: Secondary | ICD-10-CM | POA: Diagnosis not present

## 2021-11-01 DIAGNOSIS — E1140 Type 2 diabetes mellitus with diabetic neuropathy, unspecified: Secondary | ICD-10-CM | POA: Diagnosis not present

## 2021-11-10 DIAGNOSIS — M6281 Muscle weakness (generalized): Secondary | ICD-10-CM | POA: Diagnosis not present

## 2021-11-10 DIAGNOSIS — Z7409 Other reduced mobility: Secondary | ICD-10-CM | POA: Diagnosis not present

## 2021-11-10 DIAGNOSIS — R269 Unspecified abnormalities of gait and mobility: Secondary | ICD-10-CM | POA: Diagnosis not present

## 2021-11-10 DIAGNOSIS — M48062 Spinal stenosis, lumbar region with neurogenic claudication: Secondary | ICD-10-CM | POA: Diagnosis not present

## 2021-11-16 DIAGNOSIS — I10 Essential (primary) hypertension: Secondary | ICD-10-CM | POA: Diagnosis not present

## 2021-11-17 DIAGNOSIS — R269 Unspecified abnormalities of gait and mobility: Secondary | ICD-10-CM | POA: Diagnosis not present

## 2021-11-17 DIAGNOSIS — M48062 Spinal stenosis, lumbar region with neurogenic claudication: Secondary | ICD-10-CM | POA: Diagnosis not present

## 2021-11-17 DIAGNOSIS — Z7409 Other reduced mobility: Secondary | ICD-10-CM | POA: Diagnosis not present

## 2021-11-17 DIAGNOSIS — M6281 Muscle weakness (generalized): Secondary | ICD-10-CM | POA: Diagnosis not present

## 2021-11-22 DIAGNOSIS — Z7409 Other reduced mobility: Secondary | ICD-10-CM | POA: Diagnosis not present

## 2021-11-22 DIAGNOSIS — M48062 Spinal stenosis, lumbar region with neurogenic claudication: Secondary | ICD-10-CM | POA: Diagnosis not present

## 2021-11-22 DIAGNOSIS — M6281 Muscle weakness (generalized): Secondary | ICD-10-CM | POA: Diagnosis not present

## 2021-11-22 DIAGNOSIS — R269 Unspecified abnormalities of gait and mobility: Secondary | ICD-10-CM | POA: Diagnosis not present

## 2021-12-03 DIAGNOSIS — E118 Type 2 diabetes mellitus with unspecified complications: Secondary | ICD-10-CM | POA: Diagnosis not present

## 2021-12-03 DIAGNOSIS — N189 Chronic kidney disease, unspecified: Secondary | ICD-10-CM | POA: Diagnosis not present

## 2021-12-03 DIAGNOSIS — I10 Essential (primary) hypertension: Secondary | ICD-10-CM | POA: Diagnosis not present

## 2021-12-03 DIAGNOSIS — E559 Vitamin D deficiency, unspecified: Secondary | ICD-10-CM | POA: Diagnosis not present

## 2021-12-07 DIAGNOSIS — E1140 Type 2 diabetes mellitus with diabetic neuropathy, unspecified: Secondary | ICD-10-CM | POA: Diagnosis not present

## 2021-12-07 DIAGNOSIS — E118 Type 2 diabetes mellitus with unspecified complications: Secondary | ICD-10-CM | POA: Diagnosis not present

## 2021-12-07 DIAGNOSIS — I10 Essential (primary) hypertension: Secondary | ICD-10-CM | POA: Diagnosis not present

## 2021-12-07 DIAGNOSIS — E782 Mixed hyperlipidemia: Secondary | ICD-10-CM | POA: Diagnosis not present

## 2021-12-07 DIAGNOSIS — N183 Chronic kidney disease, stage 3 unspecified: Secondary | ICD-10-CM | POA: Diagnosis not present

## 2021-12-31 ENCOUNTER — Other Ambulatory Visit (HOSPITAL_COMMUNITY): Payer: Self-pay

## 2021-12-31 DIAGNOSIS — R1310 Dysphagia, unspecified: Secondary | ICD-10-CM

## 2022-01-12 ENCOUNTER — Ambulatory Visit (HOSPITAL_COMMUNITY)
Admission: RE | Admit: 2022-01-12 | Discharge: 2022-01-12 | Disposition: A | Discharge status: Home / Self Care | Payer: Medicare Other | Source: Ambulatory Visit | Attending: Internal Medicine | Admitting: Internal Medicine

## 2022-01-12 DIAGNOSIS — R1310 Dysphagia, unspecified: Secondary | ICD-10-CM | POA: Insufficient documentation

## 2022-01-12 NOTE — Progress Notes (Signed)
Modified Barium Swallow Progress Note  Patient Details  Name: Sara Holt MRN: [id number] Date of Birth: 03-26-1935  Today's Date: 01/12/2022  Modified Barium Swallow completed.  Full report located under Chart Review in the Imaging Section.  Brief recommendations include the following:  Clinical Impression  Pt did not show any oral or oropharyngeal impairment; no penetration or aspiration. Esophageal sweep with solids did suggest previously diagnosed mild dysmotility, though this cannot be diagnosed on this type of exam. Barium tablet passed without hesitation. SLP offered strategies to improve esophageal clearance given report of regurgitation. Recommend pt f/u with primary care MD and may benefit from f/u with GI.   Swallow Evaluation Recommendations   Recommended Consults: Consider GI evaluation   SLP Diet Recommendations: Regular solids;Thin liquid   Liquid Administration via: Cup;Straw                            Lalita Ebel, Katherene Ponto 01/12/2022,1:07 PM

## 2022-01-25 DIAGNOSIS — M1990 Unspecified osteoarthritis, unspecified site: Secondary | ICD-10-CM | POA: Diagnosis not present

## 2022-01-25 DIAGNOSIS — R7982 Elevated C-reactive protein (CRP): Secondary | ICD-10-CM | POA: Diagnosis not present

## 2022-01-25 DIAGNOSIS — M4802 Spinal stenosis, cervical region: Secondary | ICD-10-CM | POA: Diagnosis not present

## 2022-01-25 DIAGNOSIS — M159 Polyosteoarthritis, unspecified: Secondary | ICD-10-CM | POA: Diagnosis not present

## 2022-01-25 DIAGNOSIS — G894 Chronic pain syndrome: Secondary | ICD-10-CM | POA: Diagnosis not present

## 2022-01-25 DIAGNOSIS — M5136 Other intervertebral disc degeneration, lumbar region: Secondary | ICD-10-CM | POA: Diagnosis not present

## 2022-01-25 DIAGNOSIS — M542 Cervicalgia: Secondary | ICD-10-CM | POA: Diagnosis not present

## 2022-01-31 DIAGNOSIS — L821 Other seborrheic keratosis: Secondary | ICD-10-CM | POA: Diagnosis not present

## 2022-01-31 DIAGNOSIS — E559 Vitamin D deficiency, unspecified: Secondary | ICD-10-CM | POA: Diagnosis not present

## 2022-01-31 DIAGNOSIS — E1122 Type 2 diabetes mellitus with diabetic chronic kidney disease: Secondary | ICD-10-CM | POA: Diagnosis not present

## 2022-01-31 DIAGNOSIS — E1140 Type 2 diabetes mellitus with diabetic neuropathy, unspecified: Secondary | ICD-10-CM | POA: Diagnosis not present

## 2022-01-31 DIAGNOSIS — Z0000 Encounter for general adult medical examination without abnormal findings: Secondary | ICD-10-CM | POA: Diagnosis not present

## 2022-01-31 DIAGNOSIS — M4692 Unspecified inflammatory spondylopathy, cervical region: Secondary | ICD-10-CM | POA: Diagnosis not present

## 2022-01-31 DIAGNOSIS — I739 Peripheral vascular disease, unspecified: Secondary | ICD-10-CM | POA: Diagnosis not present

## 2022-01-31 DIAGNOSIS — E113291 Type 2 diabetes mellitus with mild nonproliferative diabetic retinopathy without macular edema, right eye: Secondary | ICD-10-CM | POA: Diagnosis not present

## 2022-01-31 DIAGNOSIS — R1110 Vomiting, unspecified: Secondary | ICD-10-CM | POA: Diagnosis not present

## 2022-01-31 DIAGNOSIS — I10 Essential (primary) hypertension: Secondary | ICD-10-CM | POA: Diagnosis not present

## 2022-02-07 DIAGNOSIS — G8929 Other chronic pain: Secondary | ICD-10-CM | POA: Diagnosis not present

## 2022-02-07 DIAGNOSIS — M25561 Pain in right knee: Secondary | ICD-10-CM | POA: Diagnosis not present

## 2022-02-07 DIAGNOSIS — M1711 Unilateral primary osteoarthritis, right knee: Secondary | ICD-10-CM | POA: Diagnosis not present

## 2022-02-09 DIAGNOSIS — G5603 Carpal tunnel syndrome, bilateral upper limbs: Secondary | ICD-10-CM | POA: Diagnosis not present

## 2022-02-15 DIAGNOSIS — H5211 Myopia, right eye: Secondary | ICD-10-CM | POA: Diagnosis not present

## 2022-02-15 DIAGNOSIS — H5202 Hypermetropia, left eye: Secondary | ICD-10-CM | POA: Diagnosis not present

## 2022-02-15 DIAGNOSIS — H52203 Unspecified astigmatism, bilateral: Secondary | ICD-10-CM | POA: Diagnosis not present

## 2022-02-15 DIAGNOSIS — H524 Presbyopia: Secondary | ICD-10-CM | POA: Diagnosis not present

## 2022-02-15 DIAGNOSIS — Z7984 Long term (current) use of oral hypoglycemic drugs: Secondary | ICD-10-CM | POA: Diagnosis not present

## 2022-02-15 DIAGNOSIS — E119 Type 2 diabetes mellitus without complications: Secondary | ICD-10-CM | POA: Diagnosis not present

## 2022-03-08 ENCOUNTER — Telehealth: Payer: Self-pay

## 2022-03-08 NOTE — Patient Outreach (Signed)
  Care Coordination   03/08/2022 Name: Sara Holt MRN: [id number] DOB: 1935-03-27   Care Coordination Outreach Attempts:  An unsuccessful telephone outreach was attempted today to offer the patient information about available care coordination services as a benefit of their health plan.   Follow Up Plan:  Additional outreach attempts will be made to offer the patient care coordination information and services.   Encounter Outcome:  No Answer  Care Coordination Interventions Activated:  No   Care Coordination Interventions:  No, not indicated    [facility] [phone]

## 2022-03-10 DIAGNOSIS — Z03818 Encounter for observation for suspected exposure to other biological agents ruled out: Secondary | ICD-10-CM | POA: Diagnosis not present

## 2022-03-25 ENCOUNTER — Telehealth: Payer: Self-pay

## 2022-03-25 NOTE — Patient Outreach (Signed)
  Care Coordination   Initial Visit Note   03/25/2022 Name: Sara Holt MRN: [id number] DOB: 05/05/1935  Sara Holt is a 87 y.o. year old female who sees Sara Huddle, MD for primary care. I spoke with  Sara Holt by phone today.  What matters to the patients health and wellness today?  No Concerns Expressed/Requested Call Back    Goals Addressed   None     SDOH assessments and interventions completed:  No     Care Coordination Interventions Activated:  No  Care Coordination Interventions:  No, not indicated   Follow up plan:  Will follow up next week.    Encounter Outcome:  Pt. Scheduled   Sara Holt Care Management [phone]

## 2022-03-31 ENCOUNTER — Ambulatory Visit: Payer: Self-pay

## 2022-03-31 NOTE — Patient Outreach (Signed)
  Care Coordination   03/31/2022 Name: Sara Holt MRN: [id number] DOB: 1934-09-02   Care Coordination Outreach Attempts:  An unsuccessful telephone outreach was attempted for a scheduled appointment today.  Follow Up Plan:  Additional outreach attempts will be made to offer the patient care coordination information and services.   Encounter Outcome:  No Answer  Care Coordination Interventions Activated:  No   Care Coordination Interventions:  No, not indicated    [facility] [phone]

## 2022-04-01 DIAGNOSIS — G894 Chronic pain syndrome: Secondary | ICD-10-CM | POA: Diagnosis not present

## 2022-04-01 DIAGNOSIS — M353 Polymyalgia rheumatica: Secondary | ICD-10-CM | POA: Diagnosis not present

## 2022-04-01 DIAGNOSIS — M4802 Spinal stenosis, cervical region: Secondary | ICD-10-CM | POA: Diagnosis not present

## 2022-04-01 DIAGNOSIS — M159 Polyosteoarthritis, unspecified: Secondary | ICD-10-CM | POA: Diagnosis not present

## 2022-04-01 DIAGNOSIS — M542 Cervicalgia: Secondary | ICD-10-CM | POA: Diagnosis not present

## 2022-04-01 DIAGNOSIS — M5136 Other intervertebral disc degeneration, lumbar region: Secondary | ICD-10-CM | POA: Diagnosis not present

## 2022-04-22 DIAGNOSIS — Z23 Encounter for immunization: Secondary | ICD-10-CM | POA: Diagnosis not present

## 2022-04-22 DIAGNOSIS — E1165 Type 2 diabetes mellitus with hyperglycemia: Secondary | ICD-10-CM | POA: Diagnosis not present

## 2022-04-22 DIAGNOSIS — I10 Essential (primary) hypertension: Secondary | ICD-10-CM | POA: Diagnosis not present

## 2022-04-22 DIAGNOSIS — K219 Gastro-esophageal reflux disease without esophagitis: Secondary | ICD-10-CM | POA: Diagnosis not present

## 2022-04-22 DIAGNOSIS — M5441 Lumbago with sciatica, right side: Secondary | ICD-10-CM | POA: Diagnosis not present

## 2022-05-02 DIAGNOSIS — M353 Polymyalgia rheumatica: Secondary | ICD-10-CM | POA: Diagnosis not present

## 2022-05-02 DIAGNOSIS — Z79899 Other long term (current) drug therapy: Secondary | ICD-10-CM | POA: Diagnosis not present

## 2022-05-03 DIAGNOSIS — M1711 Unilateral primary osteoarthritis, right knee: Secondary | ICD-10-CM | POA: Diagnosis not present

## 2022-05-03 DIAGNOSIS — M65161 Other infective (teno)synovitis, right knee: Secondary | ICD-10-CM | POA: Diagnosis not present

## 2022-05-12 DIAGNOSIS — M7542 Impingement syndrome of left shoulder: Secondary | ICD-10-CM | POA: Diagnosis not present

## 2022-05-12 DIAGNOSIS — M19012 Primary osteoarthritis, left shoulder: Secondary | ICD-10-CM | POA: Diagnosis not present

## 2022-05-12 DIAGNOSIS — M1711 Unilateral primary osteoarthritis, right knee: Secondary | ICD-10-CM | POA: Diagnosis not present

## 2022-05-12 DIAGNOSIS — M25512 Pain in left shoulder: Secondary | ICD-10-CM | POA: Diagnosis not present

## 2022-05-12 DIAGNOSIS — G8929 Other chronic pain: Secondary | ICD-10-CM | POA: Diagnosis not present

## 2022-05-15 DIAGNOSIS — M7542 Impingement syndrome of left shoulder: Secondary | ICD-10-CM | POA: Diagnosis not present

## 2022-05-15 DIAGNOSIS — M1711 Unilateral primary osteoarthritis, right knee: Secondary | ICD-10-CM | POA: Diagnosis not present

## 2022-05-15 DIAGNOSIS — M19012 Primary osteoarthritis, left shoulder: Secondary | ICD-10-CM | POA: Diagnosis not present

## 2022-06-06 DIAGNOSIS — M810 Age-related osteoporosis without current pathological fracture: Secondary | ICD-10-CM | POA: Diagnosis not present

## 2022-06-06 DIAGNOSIS — I10 Essential (primary) hypertension: Secondary | ICD-10-CM | POA: Diagnosis not present

## 2022-06-06 DIAGNOSIS — I6521 Occlusion and stenosis of right carotid artery: Secondary | ICD-10-CM | POA: Diagnosis not present

## 2022-06-06 DIAGNOSIS — I739 Peripheral vascular disease, unspecified: Secondary | ICD-10-CM | POA: Diagnosis not present

## 2022-06-06 DIAGNOSIS — N1831 Chronic kidney disease, stage 3a: Secondary | ICD-10-CM | POA: Diagnosis not present

## 2022-06-06 DIAGNOSIS — E119 Type 2 diabetes mellitus without complications: Secondary | ICD-10-CM | POA: Diagnosis not present

## 2022-06-06 DIAGNOSIS — E559 Vitamin D deficiency, unspecified: Secondary | ICD-10-CM | POA: Diagnosis not present

## 2022-06-06 DIAGNOSIS — R946 Abnormal results of thyroid function studies: Secondary | ICD-10-CM | POA: Diagnosis not present

## 2022-06-06 DIAGNOSIS — E1140 Type 2 diabetes mellitus with diabetic neuropathy, unspecified: Secondary | ICD-10-CM | POA: Diagnosis not present

## 2022-07-06 DIAGNOSIS — M19012 Primary osteoarthritis, left shoulder: Secondary | ICD-10-CM | POA: Diagnosis not present

## 2022-07-06 DIAGNOSIS — M4802 Spinal stenosis, cervical region: Secondary | ICD-10-CM | POA: Diagnosis not present

## 2022-07-06 DIAGNOSIS — G894 Chronic pain syndrome: Secondary | ICD-10-CM | POA: Diagnosis not present

## 2022-07-06 DIAGNOSIS — M25512 Pain in left shoulder: Secondary | ICD-10-CM | POA: Diagnosis not present

## 2022-07-06 DIAGNOSIS — M5136 Other intervertebral disc degeneration, lumbar region: Secondary | ICD-10-CM | POA: Diagnosis not present

## 2022-07-06 DIAGNOSIS — G8929 Other chronic pain: Secondary | ICD-10-CM | POA: Diagnosis not present

## 2022-07-06 DIAGNOSIS — M159 Polyosteoarthritis, unspecified: Secondary | ICD-10-CM | POA: Diagnosis not present

## 2022-07-19 DIAGNOSIS — M1711 Unilateral primary osteoarthritis, right knee: Secondary | ICD-10-CM | POA: Diagnosis not present

## 2022-07-19 DIAGNOSIS — M19012 Primary osteoarthritis, left shoulder: Secondary | ICD-10-CM | POA: Diagnosis not present

## 2022-07-29 DIAGNOSIS — M6281 Muscle weakness (generalized): Secondary | ICD-10-CM | POA: Diagnosis not present

## 2022-07-29 DIAGNOSIS — M25612 Stiffness of left shoulder, not elsewhere classified: Secondary | ICD-10-CM | POA: Diagnosis not present

## 2022-07-29 DIAGNOSIS — M25512 Pain in left shoulder: Secondary | ICD-10-CM | POA: Diagnosis not present

## 2022-07-29 DIAGNOSIS — M25412 Effusion, left shoulder: Secondary | ICD-10-CM | POA: Diagnosis not present

## 2022-07-29 DIAGNOSIS — G8929 Other chronic pain: Secondary | ICD-10-CM | POA: Diagnosis not present

## 2022-08-05 DIAGNOSIS — M25512 Pain in left shoulder: Secondary | ICD-10-CM | POA: Diagnosis not present

## 2022-08-05 DIAGNOSIS — M25412 Effusion, left shoulder: Secondary | ICD-10-CM | POA: Diagnosis not present

## 2022-08-05 DIAGNOSIS — M25612 Stiffness of left shoulder, not elsewhere classified: Secondary | ICD-10-CM | POA: Diagnosis not present

## 2022-08-05 DIAGNOSIS — M6281 Muscle weakness (generalized): Secondary | ICD-10-CM | POA: Diagnosis not present

## 2022-10-03 ENCOUNTER — Ambulatory Visit (HOSPITAL_COMMUNITY)
Admission: EM | Admit: 2022-10-03 | Discharge: 2022-10-03 | Disposition: A | Discharge status: Home / Self Care | Payer: Medicare Other | Attending: Family Medicine | Admitting: Family Medicine

## 2022-10-03 ENCOUNTER — Other Ambulatory Visit: Payer: Self-pay

## 2022-10-03 ENCOUNTER — Encounter (HOSPITAL_COMMUNITY): Payer: Self-pay

## 2022-10-03 VITALS — BP 134/74 | HR 77 | Temp 97.8°F | Resp 20

## 2022-10-03 DIAGNOSIS — Z23 Encounter for immunization: Secondary | ICD-10-CM | POA: Diagnosis not present

## 2022-10-03 DIAGNOSIS — L03011 Cellulitis of right finger: Secondary | ICD-10-CM | POA: Diagnosis not present

## 2022-10-03 DIAGNOSIS — S6991XA Unspecified injury of right wrist, hand and finger(s), initial encounter: Secondary | ICD-10-CM | POA: Diagnosis not present

## 2022-10-03 MED ORDER — CEPHALEXIN 250 MG PO CAPS: 250.0000 mg | ORAL_CAPSULE | Freq: Three times a day (TID) | ORAL | 0 refills | Status: AC

## 2022-10-03 MED ORDER — MUPIROCIN 2 % EX OINT: 1.0000 | TOPICAL_OINTMENT | Freq: Two times a day (BID) | CUTANEOUS | 0 refills | Status: AC

## 2022-10-03 MED ORDER — TETANUS-DIPHTH-ACELL PERTUSSIS 5-2.5-18.5 LF-MCG/0.5 IM SUSY
PREFILLED_SYRINGE | INTRAMUSCULAR | Status: AC
  Filled 2022-10-03: qty 0.5

## 2022-10-03 MED ORDER — TETANUS-DIPHTH-ACELL PERTUSSIS 5-2.5-18.5 LF-MCG/0.5 IM SUSY
0.5000 mL | PREFILLED_SYRINGE | Freq: Once | INTRAMUSCULAR | Status: AC
  Administered 2022-10-03: 0.5 mL via INTRAMUSCULAR

## 2022-10-03 NOTE — ED Triage Notes (Signed)
Patient has recently started having manicures with nail applications. The right middle finger nail has had complications.  Issues started 2 weeks ago and have progressed.   There is redness and puffiness around this nail.  The nail itself is dark/discolored at cuticle and has open ridge at base of nail.  Patient reports there has been a discharge.

## 2022-10-03 NOTE — ED Provider Notes (Addendum)
[facility]    CSN: [id number] Arrival date & time: 10/03/22  1055      History   Chief Complaint Chief Complaint  Patient presents with   Appointment    11:00   Hand Pain    HPI Sara Holt is a 88 y.o. female.    Hand Pain   Here for soreness and bleeding from around the base of her right middle finger nail.  Last week she had a manicure done and false nails applied.  During the manicure she felt a nip when they were using cuticle scissors around the base of her right middle fingernail.  Then about 2 days ago she noted bleeding from around her right middle finger nail and the false nail lifted off.  She has had a little serous drainage from it since and it is little erythematous proximal to the nail.  No fever or chills.  She does have a history of diabetes  Past Medical History:  Diagnosis Date   Allergy    Anemia    Arthritis    OA   Asthma    Carotid artery occlusion    Diabetes mellitus    DIET CONTROLLED, NO MEDS   Disc    problems in neck and back   GERD (gastroesophageal reflux disease)    Hyperlipidemia    Hypertension    Neuromuscular disorder    nerve problems with hands   Osteoporosis    Sleep apnea     Patient Active Problem List   Diagnosis Date Noted   Degeneration of lumbar intervertebral disc 03/30/2021   Acute bronchitis 03/30/2021   Age-related osteoporosis without current pathological fracture 03/30/2021   Allergic rhinitis 03/30/2021   Cardiac arrhythmia 03/30/2021   Chronic pain 03/30/2021   Cough 03/30/2021   Disorder of musculoskeletal system 03/30/2021   Gastro-esophageal reflux disease without esophagitis 03/30/2021   Hypercalcemia 03/30/2021   Inflamed seborrheic keratosis 03/30/2021   Moderate persistent asthma 03/30/2021   Neuropathy due to type 2 diabetes mellitus 03/30/2021   Non-toxic multinodular goiter 03/30/2021   Osteoporosis 03/30/2021   Personal history of other diseases of the circulatory  system 03/30/2021   Sciatica 03/30/2021   Skin sensation disturbance 03/30/2021   Urinary incontinence 03/30/2021   Vitamin D deficiency 03/30/2021   Unilateral primary osteoarthritis, right knee 06/22/2020   Neck pain 04/12/2018   Ulnar neuropathy of left upper extremity 03/16/2018   Carotid artery occlusion 01/09/2017   PAD (peripheral artery disease) 01/09/2017   RBBB 01/09/2017   Shortness of breath 01/09/2017   Essential hypertension 07/08/2016   Reactive airway disease 06/10/2016   Reactive airway disease with wheezing with acute exacerbation 06/10/2016   Acute kidney injury superimposed on chronic kidney disease 06/10/2016   Elevated troponin 06/10/2016   Chronic kidney disease, stage III (moderate) 12/25/2015   Type 2 diabetes mellitus without complication [id number]   Anemia of renal disease 08/17/2015   Anemia, iron deficiency 01/22/2015   Gait disorder 12/11/2014   Diabetes, polyneuropathy ([place]) 12/11/2014   CMC arthritis, thumb, degenerative 12/03/2014   Near syncope 08/21/2014   Numbness and tingling in right hand 03/04/2014   [facility] Leg 03/04/2014   Pain of right lower leg 03/04/2014   Aftercare following surgery of the circulatory system, NEC 03/04/2014   Brachial neuritis 12/08/2012   Carpal tunnel syndrome 12/08/2012   HLD (hyperlipidemia) 12/08/2012   Arthritis, degenerative 12/08/2012   Cervical spinal stenosis 12/08/2012   Hyperlipidemia associated with type 2 diabetes mellitus 12/08/2012  Foot pain 09/21/2012   Personal history of colonic polyps 09/12/2012   Hemorrhage of rectum and anus 09/12/2012   Hammertoe 05/24/2012   Presence of unspecified artificial knee joint 05/24/2012   Stress fracture of foot 05/24/2012   Encounter for postoperative carotid endarterectomy surveillance 08/26/2011    Past Surgical History:  Procedure Laterality Date   CAROTID ARTERY ANGIOPLASTY     CAROTID ENDARTERECTOMY  Jan. 16,2012   LEFT cea   carpel tunnel      both hands   COLONOSCOPY     JOINT REPLACEMENT Left 2011   Knee   KNEE ARTHROSCOPY Left 2010   POLYPECTOMY      OB History   No obstetric history on file.      Home Medications    Prior to Admission medications   Medication Sig Start Date End Date Taking? Authorizing Provider  cephALEXin (KEFLEX) 250 MG capsule Take 1 capsule (250 mg total) by mouth 3 (three) times daily for 5 days. 10/03/22 10/08/22 Yes Keimya Briddell, Gwenlyn Perking, MD  mupirocin ointment (BACTROBAN) 2 % Apply 1 Application topically 2 (two) times daily. To affected area till better 10/03/22  Yes Codylee Patil, Gwenlyn Perking, MD  acetaminophen (TYLENOL) 325 MG tablet Take 650 mg by mouth every 6 (six) hours as needed.    [provider]  amLODipine (NORVASC) 5 MG tablet Take 1 tablet by mouth daily.    [provider]  ammonium lactate (AMLACTIN) 12 % cream APPLY TOPICALLY TO THE AFFECTED AREA EVERY DAY AFTER THE SHOWER 07/08/19   [provider]  aspirin EC 81 MG tablet Take 81 mg by mouth daily.    [provider]  azelastine (ASTELIN) 0.1 % nasal spray U 1 SPR IEN BID PRN 02/14/17   [provider]  carvedilol (COREG) 12.5 MG tablet TAKE 1 TABLET BY MOUTH TWO  TIMES DAILY 09/14/16   [provider]  chlorpheniramine-HYDROcodone ([place]) 10-8 MG/5ML SUER SMARTSIG:5 Milliliter(s) By Mouth Every 12 Hours PRN 05/25/20   [provider]  cholecalciferol (VITAMIN D) 1000 units tablet Take 1,000 Units by mouth daily.    [provider]  cyanocobalamin 1000 MCG tablet Take by mouth.    [provider]  CYMBALTA 60 MG capsule Take 1 tablet by mouth daily.  08/22/11   [provider]  desoximetasone (TOPICORT) 0.25 % cream Apply topically continuous as needed (skin allergy).  01/13/12   [provider]  diclofenac sodium (VOLTAREN) 1 % GEL Apply 4 g topically as needed (pain).     [provider]  fluticasone Asencion Islam) 50 MCG/ACT nasal spray   02/18/17   [provider]  fluticasone furoate-vilanterol (BREO ELLIPTA) 100-25 MCG/INH AEPB Inhale 1 puff into the lungs once.    [provider]  glimepiride (AMARYL) 4 MG tablet Take 4 mg by mouth.    [provider]  glucose blood test strip  10/23/17   [provider]  HYDROcodone-acetaminophen (NORCO/VICODIN) 5-325 MG tablet 1 tablet 09/07/21   [provider]  loratadine (CLARITIN) 10 MG tablet Take 10 mg by mouth. 11/29/16   [provider]  Lutein 10 MG TABS Take by mouth.    [provider]  metFORMIN (GLUCOPHAGE-XR) 500 MG 24 hr tablet Take 1 mg by mouth daily with breakfast.  05/01/17   [provider]  methocarbamol (ROBAXIN) 500 MG tablet Take 500 mg by mouth daily as needed. 07/25/18   [provider]  montelukast (SINGULAIR) 10 MG tablet  03/19/19  [provider]  omeprazole (PRILOSEC) 40 MG capsule Take 40 mg by mouth.    [provider]  ONE TOUCH ULTRA TEST test strip  06/18/14   [provider]  OVER THE COUNTER MEDICATION Place 1 drop into both eyes at bedtime. dry eyes med    [provider]  oxybutynin (DITROPAN) 5 MG tablet Take 5 mg by mouth daily. 06/14/13   [provider]  simvastatin (ZOCOR) 10 MG tablet Take 10 mg by mouth Daily.  06/06/11   [provider]  valsartan-hydrochlorothiazide (DIOVAN-HCT) 160-12.5 MG tablet 1 tablet 05/28/21   [provider]    Family History Family History  Problem Relation Age of Onset   Diabetes Father    Pneumonia Father    Peripheral vascular disease Sister    Colon cancer Maternal Grandmother    Cancer Maternal Grandmother        Colon cancer   Esophageal cancer Neg Hx    Stomach cancer Neg Hx    Rectal cancer Neg Hx     Social History Social History   Tobacco Use   Smoking status: Never    Passive exposure: Never   Smokeless tobacco: Never  Vaping Use   Vaping Use: Never  used  Substance Use Topics   Alcohol use: Yes    Comment: occassionally   Drug use: No     Allergies   Buprenorphine hcl, Morphine, Sulfa antibiotics, Sulfacetamide sodium, Oxycodone hcl, Morphine sulfate, Tramadol hcl, and Sulfamethoxazole   Review of Systems Review of Systems   Physical Exam Triage Vital Signs ED Triage Vitals  Enc Vitals Group     BP 10/03/22 1131 134/74     Pulse Rate 10/03/22 1131 77     Resp 10/03/22 1131 20     Temp 10/03/22 1131 97.8 F (36.6 C)     Temp Source 10/03/22 1131 Oral     SpO2 10/03/22 1131 96 %     Weight --      Height --      Head Circumference --      Peak Flow --      Pain Score 10/03/22 1129 0     Pain Loc --      Pain Edu? --      Excl. in [place]? --    No data found.  Updated Vital Signs BP 134/74 (BP Location: Right Arm)   Pulse 77   Temp 97.8 F (36.6 C) (Oral)   Resp 20   SpO2 96%   Visual Acuity Right Eye Distance:   Left Eye Distance:   Bilateral Distance:    Right Eye Near:   Left Eye Near:    Bilateral Near:     Physical Exam Vitals reviewed.  Constitutional:      General: She is not in acute distress.    Appearance: She is not ill-appearing, toxic-appearing or diaphoretic.  HENT:     Mouth/Throat:     Mouth: Mucous membranes are moist.  Eyes:     Extraocular Movements: Extraocular movements intact.     Pupils: Pupils are equal, round, and reactive to light.  Skin:    Coloration: Skin is not jaundiced or pale.     Comments: There is very mild erythema proximal to the right middle finger nail fold.  On the dorsum of the finger.  It is mildly tender.  There is fingernail intact on the nailbed, but there is about 1 to 2 mm where the fingernail is missing  on the proximal portion.  There is some blood under the fingernail and the proximal third of the nail.  No swelling of the lateral and medial nail folds.  Neurological:     Mental Status: She is alert and oriented to person, place, and time.   Psychiatric:        Behavior: Behavior normal.      UC Treatments / Results  Labs (all labs ordered are listed, but only abnormal results are displayed) Labs Reviewed - No data to display  EKG   Radiology No results found.  Procedures Procedures (including critical care time)  Medications Ordered in UC Medications  Tdap (BOOSTRIX) injection 0.5 mL (has no administration in time range)    Initial Impression / Assessment and Plan / UC Course  I have reviewed the triage vital signs and the nursing notes.  Pertinent labs & imaging results that were available during my care of the patient were reviewed by me and considered in my medical decision making (see chart for details).           Mupirocin and Keflex are sent in for possible mild cellulitis.  I discussed with her keeping the nail splinted with a Band-Aid and keeping it covered.  She will follow-up with her primary care  Her last tetanus was in 2015, so I am going to update that today.   Final Clinical Impressions(s) / UC Diagnoses   Final diagnoses:  Cellulitis of finger of right hand  Injury to fingernail of right hand, initial encounter     Discharge Instructions      Take cephalexin 250 mg--1 capsule 3 times daily for 5 days   Put mupirocin ointment on the sore area twice daily until improved      ED Prescriptions     Medication Sig Dispense Auth. Provider   mupirocin ointment (BACTROBAN) 2 % Apply 1 Application topically 2 (two) times daily. To affected area till better 22 g Barrett Henle, MD   cephALEXin (KEFLEX) 250 MG capsule Take 1 capsule (250 mg total) by mouth 3 (three) times daily for 5 days. 15 capsule Ryman Rathgeber, Gwenlyn Perking, MD      I have reviewed the PDMP during this encounter.   Barrett Henle, MD 10/03/22 1213    Barrett Henle, MD 10/03/22 1215

## 2022-10-03 NOTE — Discharge Instructions (Addendum)
Take cephalexin 250 mg--1 capsule 3 times daily for 5 days   Put mupirocin ointment on the sore area twice daily until improved

## 2022-10-07 DIAGNOSIS — E119 Type 2 diabetes mellitus without complications: Secondary | ICD-10-CM | POA: Diagnosis not present

## 2022-10-07 DIAGNOSIS — E1122 Type 2 diabetes mellitus with diabetic chronic kidney disease: Secondary | ICD-10-CM | POA: Diagnosis not present

## 2022-10-07 DIAGNOSIS — N183 Chronic kidney disease, stage 3 unspecified: Secondary | ICD-10-CM | POA: Diagnosis not present

## 2022-10-07 DIAGNOSIS — R946 Abnormal results of thyroid function studies: Secondary | ICD-10-CM | POA: Diagnosis not present

## 2022-10-07 DIAGNOSIS — R2689 Other abnormalities of gait and mobility: Secondary | ICD-10-CM | POA: Diagnosis not present

## 2022-10-10 DIAGNOSIS — M19012 Primary osteoarthritis, left shoulder: Secondary | ICD-10-CM | POA: Diagnosis not present

## 2022-11-01 ENCOUNTER — Other Ambulatory Visit: Payer: Self-pay | Admitting: *Deleted

## 2022-11-01 DIAGNOSIS — I6523 Occlusion and stenosis of bilateral carotid arteries: Secondary | ICD-10-CM

## 2022-11-01 IMAGING — DX DG CHEST 1V PORT
1 series · 1 of 1 positions shown · non-contrast
Comparison: 06/09/2016

CLINICAL DATA: Cough, wheezing, shortness of breath

EXAM:
PORTABLE CHEST 1 VIEW

[chest ap]
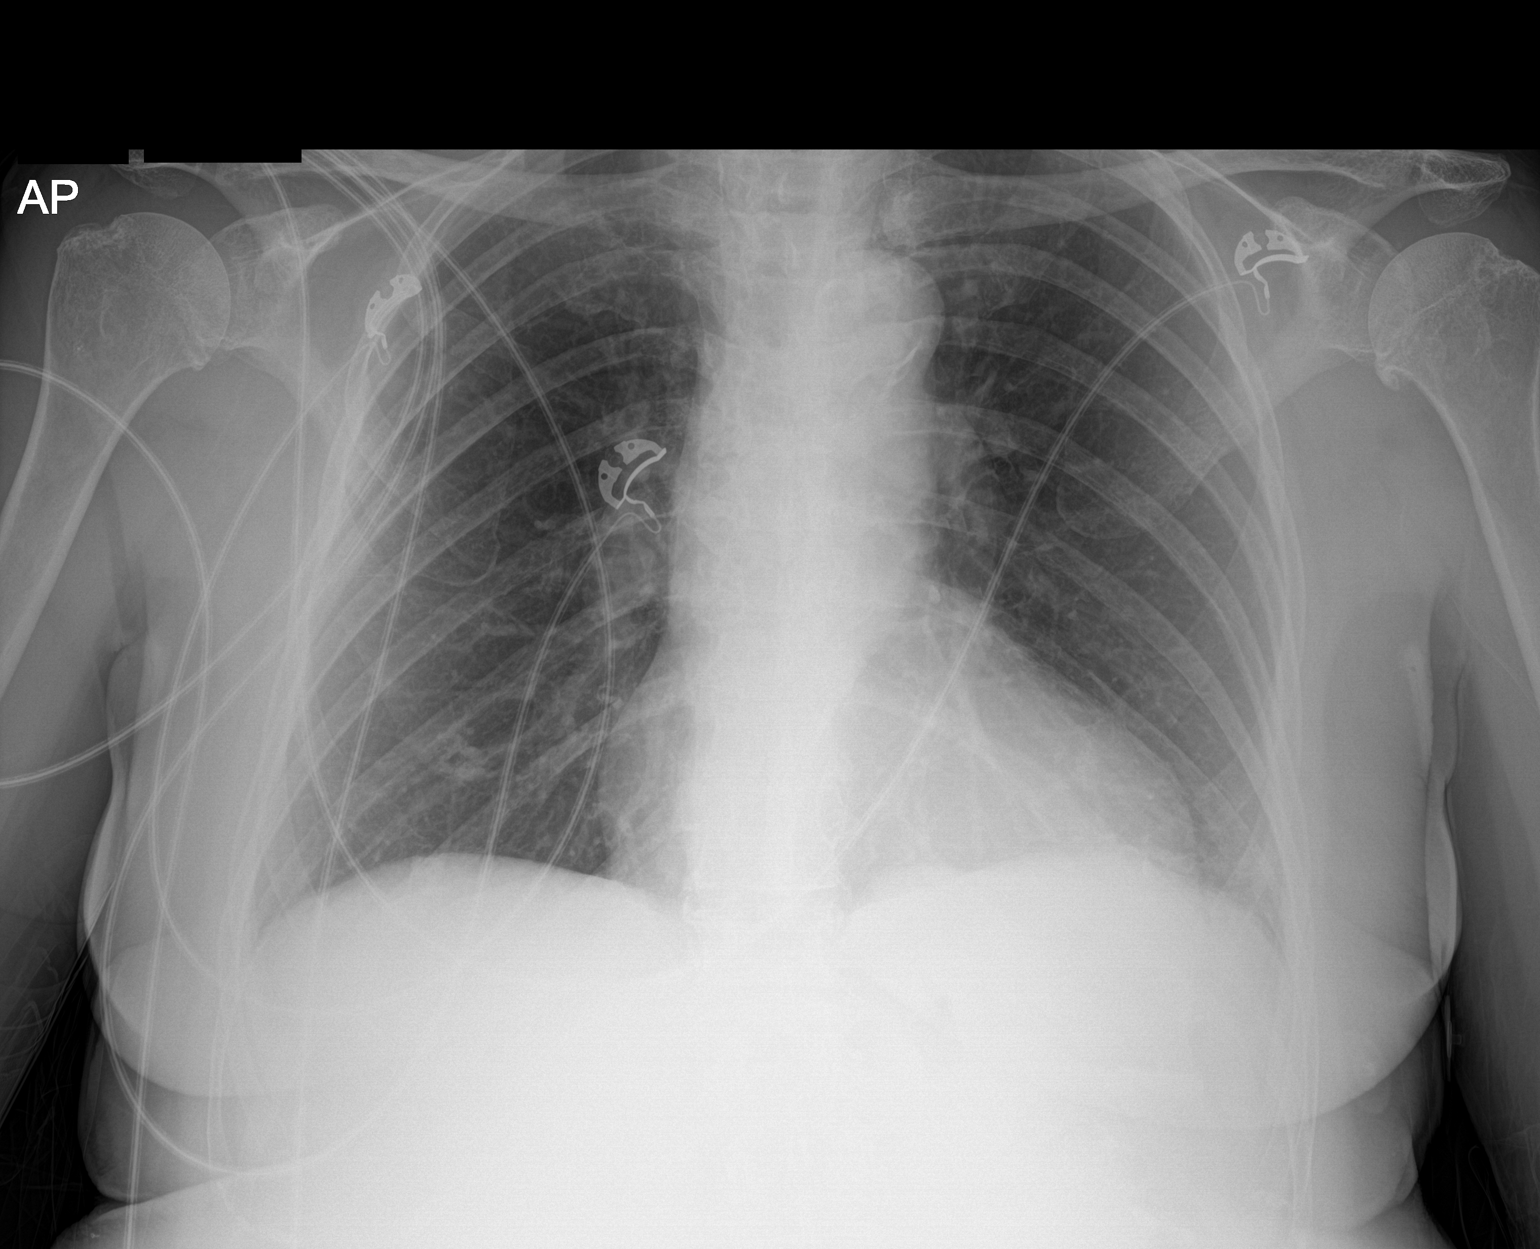

[1 of 1 positions shown; findings below may reference images not displayed]

FINDINGS: The heart size and mediastinal contours are within normal limits.
Atherosclerotic calcification of the aortic knob. Minimal left
basilar scarring or atelectasis. Otherwise, no focal airspace
consolidation, pleural effusion, or pneumothorax. The visualized
skeletal structures are unremarkable.
IMPRESSION: No active disease.

## 2022-11-08 ENCOUNTER — Ambulatory Visit: Payer: Medicare Other

## 2022-11-08 ENCOUNTER — Ambulatory Visit (HOSPITAL_COMMUNITY): Payer: Medicare Other

## 2022-12-13 ENCOUNTER — Ambulatory Visit (HOSPITAL_COMMUNITY)
Admission: RE | Admit: 2022-12-13 | Discharge: 2022-12-13 | Disposition: A | Discharge status: Home / Self Care | Payer: Medicare Other | Source: Ambulatory Visit | Attending: Physician Assistant | Admitting: Physician Assistant

## 2022-12-13 ENCOUNTER — Ambulatory Visit: Payer: Medicare Other | Admitting: Physician Assistant

## 2022-12-13 VITALS — BP 111/56 | HR 70 | Temp 98.1°F | Resp 18 | Ht 64.0 in | Wt 153.4 lb

## 2022-12-13 DIAGNOSIS — I6523 Occlusion and stenosis of bilateral carotid arteries: Secondary | ICD-10-CM

## 2022-12-13 NOTE — Progress Notes (Signed)
Office Note     CC:  follow up Requesting Provider:  Marden Noble, MD  HPI: Sara Holt is a 88 y.o. (08/17/1934) female who presents for surveillance of carotid artery stenosis.  Surgical history also significant for left carotid endarterectomy by Dr. Arbie Cookey in 2012.  Since last office visit 6 months ago she denies any strokelike symptoms including slurring speech, changes in vision, or one-sided weakness.  She is on a daily aspirin and statin.  She denies tobacco use.  Her main concern is of right knee and left shoulder pain which she is scheduled to see a new orthopedic next month.   Past Medical History:  Diagnosis Date   Allergy    Anemia    Arthritis    OA   Asthma    Carotid artery occlusion    Diabetes mellitus    DIET CONTROLLED, NO MEDS   Disc    problems in neck and back   GERD (gastroesophageal reflux disease)    Hyperlipidemia    Hypertension    Neuromuscular disorder (HCC)    nerve problems with hands   Osteoporosis    Sleep apnea     Past Surgical History:  Procedure Laterality Date   CAROTID ARTERY ANGIOPLASTY     CAROTID ENDARTERECTOMY  Jan. 16,2012   LEFT cea   carpel tunnel     both hands   COLONOSCOPY     JOINT REPLACEMENT Left 2011   Knee   KNEE ARTHROSCOPY Left 2010   POLYPECTOMY      Social History   Socioeconomic History   Marital status: Married    Spouse name: Not on file   Number of children: 2   Years of education: Not on file   Highest education level: Not on file  Occupational History   Occupation: Retired    Associate Professor: RETIRED  Tobacco Use   Smoking status: Never    Passive exposure: Never   Smokeless tobacco: Never  Vaping Use   Vaping Use: Never used  Substance and Sexual Activity   Alcohol use: Yes    Comment: occassionally   Drug use: No   Sexual activity: Not on file  Other Topics Concern   Not on file  Social History Narrative   Not on file   Social Determinants of Health   Financial Resource Strain:  Not on file  Food Insecurity: Not on file  Transportation Needs: Not on file  Physical Activity: Not on file  Stress: Not on file  Social Connections: Not on file  Intimate Partner Violence: Not on file    Family History  Problem Relation Age of Onset   Diabetes Father    Pneumonia Father    Peripheral vascular disease Sister    Colon cancer Maternal Grandmother    Cancer Maternal Grandmother        Colon cancer   Esophageal cancer Neg Hx    Stomach cancer Neg Hx    Rectal cancer Neg Hx     Current Outpatient Medications  Medication Sig Dispense Refill   amLODipine (NORVASC) 5 MG tablet Take 1 tablet by mouth daily.     ammonium lactate (AMLACTIN) 12 % cream APPLY TOPICALLY TO THE AFFECTED AREA EVERY DAY AFTER THE SHOWER     aspirin EC 81 MG tablet Take 81 mg by mouth daily.     azelastine (ASTELIN) 0.1 % nasal spray U 1 SPR IEN BID PRN  5   carvedilol (COREG) 12.5 MG tablet TAKE 1 TABLET  BY MOUTH TWO  TIMES DAILY     chlorpheniramine-HYDROcodone (TUSSIONEX) 10-8 MG/5ML SUER SMARTSIG:5 Milliliter(s) By Mouth Every 12 Hours PRN     cholecalciferol (VITAMIN D) 1000 units tablet Take 1,000 Units by mouth daily.     cyanocobalamin 1000 MCG tablet Take by mouth.     CYMBALTA 60 MG capsule Take 1 tablet by mouth daily.      desoximetasone (TOPICORT) 0.25 % cream Apply topically continuous as needed (skin allergy).      diclofenac sodium (VOLTAREN) 1 % GEL Apply 4 g topically as needed (pain).      fluticasone (FLONASE) 50 MCG/ACT nasal spray      fluticasone furoate-vilanterol (BREO ELLIPTA) 100-25 MCG/INH AEPB Inhale 1 puff into the lungs once.     glimepiride (AMARYL) 4 MG tablet Take 4 mg by mouth.     glucose blood test strip      HYDROcodone-acetaminophen (NORCO/VICODIN) 5-325 MG tablet 1 tablet     loratadine (CLARITIN) 10 MG tablet Take 10 mg by mouth.     Lutein 10 MG TABS Take by mouth.     metFORMIN (GLUCOPHAGE-XR) 500 MG 24 hr tablet Take 1 mg by mouth daily with  breakfast.      methocarbamol (ROBAXIN) 500 MG tablet Take 500 mg by mouth daily as needed.     montelukast (SINGULAIR) 10 MG tablet      mupirocin ointment (BACTROBAN) 2 % Apply 1 Application topically 2 (two) times daily. To affected area till better 22 g 0   omeprazole (PRILOSEC) 40 MG capsule Take 40 mg by mouth.     ONE TOUCH ULTRA TEST test strip   0   OVER THE COUNTER MEDICATION Place 1 drop into both eyes at bedtime. dry eyes med     oxybutynin (DITROPAN) 5 MG tablet Take 5 mg by mouth daily.     simvastatin (ZOCOR) 10 MG tablet Take 10 mg by mouth Daily.      valsartan-hydrochlorothiazide (DIOVAN-HCT) 160-12.5 MG tablet 1 tablet     acetaminophen (TYLENOL) 325 MG tablet Take 650 mg by mouth every 6 (six) hours as needed. (Patient not taking: Reported on 12/13/2022)     No current facility-administered medications for this visit.    Allergies  Allergen Reactions   Buprenorphine Hcl Nausea And Vomiting    Other reaction(s): Nausea And Vomiting nausea nausea Other reaction(s): Nausea And Vomiting   Morphine Nausea And Vomiting    Other reaction(s): Nausea And Vomiting Other reaction(s): Nausea And Vomiting   Sulfa Antibiotics Nausea And Vomiting    "deathly ill" Other reaction(s): vomiting   Sulfacetamide Sodium Nausea And Vomiting   Oxycodone Hcl     Other reaction(s): Hallucinations   Morphine Sulfate Other (See Comments)   Tramadol Hcl     Other reaction(s): pain   Sulfamethoxazole Nausea Only     REVIEW OF SYSTEMS:   [X]  denotes positive finding, [ ]  denotes negative finding Cardiac  Comments:  Chest pain or chest pressure:    Shortness of breath upon exertion:    Short of breath when lying flat:    Irregular heart rhythm:        Vascular    Pain in calf, thigh, or hip brought on by ambulation:    Pain in feet at night that wakes you up from your sleep:     Blood clot in your veins:    Leg swelling:         Pulmonary    Oxygen at  home:    Productive  cough:     Wheezing:         Neurologic    Sudden weakness in arms or legs:     Sudden numbness in arms or legs:     Sudden onset of difficulty speaking or slurred speech:    Temporary loss of vision in one eye:     Problems with dizziness:         Gastrointestinal    Blood in stool:     Vomited blood:         Genitourinary    Burning when urinating:     Blood in urine:        Psychiatric    Major depression:         Hematologic    Bleeding problems:    Problems with blood clotting too easily:        Skin    Rashes or ulcers:        Constitutional    Fever or chills:      PHYSICAL EXAMINATION:  Vitals:   12/13/22 1353 12/13/22 1356  BP: 113/66 (!) 111/56  Pulse: 70   Resp: 18   Temp: 98.1 F (36.7 C)   TempSrc: Temporal   SpO2: 92%   Weight: 153 lb 6.4 oz (69.6 kg)   Height: 5\' 4"  (1.626 m)     General:  WDWN in NAD; vital signs documented above Gait: Not observed HENT: WNL, normocephalic Pulmonary: normal non-labored breathing , without Rales, rhonchi,  wheezing Cardiac: regular HR Abdomen: soft, NT, no masses Skin: without rashes Vascular Exam/Pulses: symmetrical radial pulses Extremities: without ischemic changes, without Gangrene , without cellulitis; without open wounds;  Musculoskeletal: no muscle wasting or atrophy  Neurologic: A&O X 3; CN grossly intact Psychiatric:  The pt has Normal affect.   Non-Invasive Vascular Imaging:   Right ICA 40 to 59% stenosis Left ICA 1 to 39% stenosis    ASSESSMENT/PLAN:: 88 y.o. female here for follow up for surveillance of carotid artery stenosis  -Subjectively the patient has not experienced any strokelike symptoms or neurological events since last office visit -Carotid duplex demonstrates 40 to 59% stenosis of the right ICA and 1 to 39% stenosis of the left ICA.  Previously the right ICA measured 60 to 79% stenosis.  We will repeat the duplex in another 6 months.  If she continues to be in the 40 to 59%  category we can follow annually. -Continue aspirin and statin daily   Emilie Rutter, PA-C [facility] [phone]  Clinic MD:   Chestine Spore

## 2022-12-19 DIAGNOSIS — M19012 Primary osteoarthritis, left shoulder: Secondary | ICD-10-CM | POA: Diagnosis not present

## 2022-12-26 ENCOUNTER — Other Ambulatory Visit: Payer: Self-pay

## 2022-12-26 DIAGNOSIS — I6523 Occlusion and stenosis of bilateral carotid arteries: Secondary | ICD-10-CM

## 2023-01-09 DIAGNOSIS — M19012 Primary osteoarthritis, left shoulder: Secondary | ICD-10-CM | POA: Diagnosis not present

## 2023-01-09 DIAGNOSIS — M1711 Unilateral primary osteoarthritis, right knee: Secondary | ICD-10-CM | POA: Diagnosis not present

## 2023-02-10 DIAGNOSIS — S52121D Displaced fracture of head of right radius, subsequent encounter for closed fracture with routine healing: Secondary | ICD-10-CM | POA: Diagnosis not present

## 2023-02-10 DIAGNOSIS — S52351A Displaced comminuted fracture of shaft of radius, right arm, initial encounter for closed fracture: Secondary | ICD-10-CM | POA: Diagnosis not present

## 2023-02-10 DIAGNOSIS — M25421 Effusion, right elbow: Secondary | ICD-10-CM | POA: Diagnosis not present

## 2023-02-10 DIAGNOSIS — M25521 Pain in right elbow: Secondary | ICD-10-CM | POA: Diagnosis not present

## 2023-02-10 DIAGNOSIS — S52121A Displaced fracture of head of right radius, initial encounter for closed fracture: Secondary | ICD-10-CM | POA: Diagnosis not present

## 2023-02-14 DIAGNOSIS — S52131A Displaced fracture of neck of right radius, initial encounter for closed fracture: Secondary | ICD-10-CM | POA: Diagnosis not present

## 2023-02-15 DIAGNOSIS — S52121D Displaced fracture of head of right radius, subsequent encounter for closed fracture with routine healing: Secondary | ICD-10-CM | POA: Diagnosis not present

## 2023-02-15 DIAGNOSIS — M25621 Stiffness of right elbow, not elsewhere classified: Secondary | ICD-10-CM | POA: Diagnosis not present

## 2023-02-22 DIAGNOSIS — S52121D Displaced fracture of head of right radius, subsequent encounter for closed fracture with routine healing: Secondary | ICD-10-CM | POA: Diagnosis not present

## 2023-02-22 DIAGNOSIS — M25621 Stiffness of right elbow, not elsewhere classified: Secondary | ICD-10-CM | POA: Diagnosis not present

## 2023-02-23 DIAGNOSIS — S52131A Displaced fracture of neck of right radius, initial encounter for closed fracture: Secondary | ICD-10-CM | POA: Diagnosis not present

## 2023-02-23 DIAGNOSIS — M25531 Pain in right wrist: Secondary | ICD-10-CM | POA: Diagnosis not present

## 2023-02-24 DIAGNOSIS — S52121D Displaced fracture of head of right radius, subsequent encounter for closed fracture with routine healing: Secondary | ICD-10-CM | POA: Diagnosis not present

## 2023-02-24 DIAGNOSIS — M25621 Stiffness of right elbow, not elsewhere classified: Secondary | ICD-10-CM | POA: Diagnosis not present

## 2023-03-01 DIAGNOSIS — S52121D Displaced fracture of head of right radius, subsequent encounter for closed fracture with routine healing: Secondary | ICD-10-CM | POA: Diagnosis not present

## 2023-03-01 DIAGNOSIS — M25621 Stiffness of right elbow, not elsewhere classified: Secondary | ICD-10-CM | POA: Diagnosis not present

## 2023-03-07 DIAGNOSIS — M25621 Stiffness of right elbow, not elsewhere classified: Secondary | ICD-10-CM | POA: Diagnosis not present

## 2023-03-07 DIAGNOSIS — S52121D Displaced fracture of head of right radius, subsequent encounter for closed fracture with routine healing: Secondary | ICD-10-CM | POA: Diagnosis not present

## 2023-03-08 DIAGNOSIS — S52121D Displaced fracture of head of right radius, subsequent encounter for closed fracture with routine healing: Secondary | ICD-10-CM | POA: Diagnosis not present

## 2023-03-08 DIAGNOSIS — M25621 Stiffness of right elbow, not elsewhere classified: Secondary | ICD-10-CM | POA: Diagnosis not present

## 2023-03-10 DIAGNOSIS — S52121D Displaced fracture of head of right radius, subsequent encounter for closed fracture with routine healing: Secondary | ICD-10-CM | POA: Diagnosis not present

## 2023-03-10 DIAGNOSIS — M25621 Stiffness of right elbow, not elsewhere classified: Secondary | ICD-10-CM | POA: Diagnosis not present

## 2023-03-13 DIAGNOSIS — S52121D Displaced fracture of head of right radius, subsequent encounter for closed fracture with routine healing: Secondary | ICD-10-CM | POA: Diagnosis not present

## 2023-03-13 DIAGNOSIS — M25621 Stiffness of right elbow, not elsewhere classified: Secondary | ICD-10-CM | POA: Diagnosis not present

## 2023-03-16 DIAGNOSIS — S52131A Displaced fracture of neck of right radius, initial encounter for closed fracture: Secondary | ICD-10-CM | POA: Diagnosis not present

## 2023-03-17 DIAGNOSIS — M25621 Stiffness of right elbow, not elsewhere classified: Secondary | ICD-10-CM | POA: Diagnosis not present

## 2023-03-17 DIAGNOSIS — S52121D Displaced fracture of head of right radius, subsequent encounter for closed fracture with routine healing: Secondary | ICD-10-CM | POA: Diagnosis not present

## 2023-03-21 DIAGNOSIS — S52121D Displaced fracture of head of right radius, subsequent encounter for closed fracture with routine healing: Secondary | ICD-10-CM | POA: Diagnosis not present

## 2023-03-21 DIAGNOSIS — M25621 Stiffness of right elbow, not elsewhere classified: Secondary | ICD-10-CM | POA: Diagnosis not present

## 2023-03-23 DIAGNOSIS — S52121D Displaced fracture of head of right radius, subsequent encounter for closed fracture with routine healing: Secondary | ICD-10-CM | POA: Diagnosis not present

## 2023-03-23 DIAGNOSIS — M25621 Stiffness of right elbow, not elsewhere classified: Secondary | ICD-10-CM | POA: Diagnosis not present

## 2023-03-27 DIAGNOSIS — M25621 Stiffness of right elbow, not elsewhere classified: Secondary | ICD-10-CM | POA: Diagnosis not present

## 2023-03-27 DIAGNOSIS — S52121D Displaced fracture of head of right radius, subsequent encounter for closed fracture with routine healing: Secondary | ICD-10-CM | POA: Diagnosis not present

## 2023-03-30 DIAGNOSIS — M25621 Stiffness of right elbow, not elsewhere classified: Secondary | ICD-10-CM | POA: Diagnosis not present

## 2023-03-30 DIAGNOSIS — S52121D Displaced fracture of head of right radius, subsequent encounter for closed fracture with routine healing: Secondary | ICD-10-CM | POA: Diagnosis not present

## 2023-04-03 DIAGNOSIS — S52121D Displaced fracture of head of right radius, subsequent encounter for closed fracture with routine healing: Secondary | ICD-10-CM | POA: Diagnosis not present

## 2023-04-03 DIAGNOSIS — M25621 Stiffness of right elbow, not elsewhere classified: Secondary | ICD-10-CM | POA: Diagnosis not present

## 2023-04-07 DIAGNOSIS — E559 Vitamin D deficiency, unspecified: Secondary | ICD-10-CM | POA: Diagnosis not present

## 2023-04-07 DIAGNOSIS — M5441 Lumbago with sciatica, right side: Secondary | ICD-10-CM | POA: Diagnosis not present

## 2023-04-07 DIAGNOSIS — Z79899 Other long term (current) drug therapy: Secondary | ICD-10-CM | POA: Diagnosis not present

## 2023-04-07 DIAGNOSIS — Z23 Encounter for immunization: Secondary | ICD-10-CM | POA: Diagnosis not present

## 2023-04-07 DIAGNOSIS — B351 Tinea unguium: Secondary | ICD-10-CM | POA: Diagnosis not present

## 2023-04-07 DIAGNOSIS — M8589 Other specified disorders of bone density and structure, multiple sites: Secondary | ICD-10-CM | POA: Diagnosis not present

## 2023-04-07 DIAGNOSIS — E1151 Type 2 diabetes mellitus with diabetic peripheral angiopathy without gangrene: Secondary | ICD-10-CM | POA: Diagnosis not present

## 2023-04-07 DIAGNOSIS — E1122 Type 2 diabetes mellitus with diabetic chronic kidney disease: Secondary | ICD-10-CM | POA: Diagnosis not present

## 2023-04-07 DIAGNOSIS — K219 Gastro-esophageal reflux disease without esophagitis: Secondary | ICD-10-CM | POA: Diagnosis not present

## 2023-04-07 DIAGNOSIS — Z0000 Encounter for general adult medical examination without abnormal findings: Secondary | ICD-10-CM | POA: Diagnosis not present

## 2023-04-07 DIAGNOSIS — N1832 Chronic kidney disease, stage 3b: Secondary | ICD-10-CM | POA: Diagnosis not present

## 2023-04-10 DIAGNOSIS — M25621 Stiffness of right elbow, not elsewhere classified: Secondary | ICD-10-CM | POA: Diagnosis not present

## 2023-04-10 DIAGNOSIS — S52121D Displaced fracture of head of right radius, subsequent encounter for closed fracture with routine healing: Secondary | ICD-10-CM | POA: Diagnosis not present

## 2023-04-14 DIAGNOSIS — M25621 Stiffness of right elbow, not elsewhere classified: Secondary | ICD-10-CM | POA: Diagnosis not present

## 2023-04-14 DIAGNOSIS — S52121D Displaced fracture of head of right radius, subsequent encounter for closed fracture with routine healing: Secondary | ICD-10-CM | POA: Diagnosis not present

## 2023-04-17 DIAGNOSIS — M1711 Unilateral primary osteoarthritis, right knee: Secondary | ICD-10-CM | POA: Diagnosis not present

## 2023-04-17 DIAGNOSIS — M19012 Primary osteoarthritis, left shoulder: Secondary | ICD-10-CM | POA: Diagnosis not present

## 2023-04-20 DIAGNOSIS — S52131D Displaced fracture of neck of right radius, subsequent encounter for closed fracture with routine healing: Secondary | ICD-10-CM | POA: Diagnosis not present

## 2023-04-21 DIAGNOSIS — S52121D Displaced fracture of head of right radius, subsequent encounter for closed fracture with routine healing: Secondary | ICD-10-CM | POA: Diagnosis not present

## 2023-04-21 DIAGNOSIS — M25621 Stiffness of right elbow, not elsewhere classified: Secondary | ICD-10-CM | POA: Diagnosis not present

## 2023-04-28 DIAGNOSIS — M25621 Stiffness of right elbow, not elsewhere classified: Secondary | ICD-10-CM | POA: Diagnosis not present

## 2023-04-28 DIAGNOSIS — S52121D Displaced fracture of head of right radius, subsequent encounter for closed fracture with routine healing: Secondary | ICD-10-CM | POA: Diagnosis not present

## 2023-05-01 DIAGNOSIS — M25621 Stiffness of right elbow, not elsewhere classified: Secondary | ICD-10-CM | POA: Diagnosis not present

## 2023-05-01 DIAGNOSIS — S52121D Displaced fracture of head of right radius, subsequent encounter for closed fracture with routine healing: Secondary | ICD-10-CM | POA: Diagnosis not present

## 2023-05-08 DIAGNOSIS — S52121D Displaced fracture of head of right radius, subsequent encounter for closed fracture with routine healing: Secondary | ICD-10-CM | POA: Diagnosis not present

## 2023-05-08 DIAGNOSIS — M25621 Stiffness of right elbow, not elsewhere classified: Secondary | ICD-10-CM | POA: Diagnosis not present

## 2023-05-19 DIAGNOSIS — S52121D Displaced fracture of head of right radius, subsequent encounter for closed fracture with routine healing: Secondary | ICD-10-CM | POA: Diagnosis not present

## 2023-05-19 DIAGNOSIS — M25621 Stiffness of right elbow, not elsewhere classified: Secondary | ICD-10-CM | POA: Diagnosis not present

## 2023-05-22 DIAGNOSIS — S52121D Displaced fracture of head of right radius, subsequent encounter for closed fracture with routine healing: Secondary | ICD-10-CM | POA: Diagnosis not present

## 2023-05-22 DIAGNOSIS — M25621 Stiffness of right elbow, not elsewhere classified: Secondary | ICD-10-CM | POA: Diagnosis not present

## 2023-05-26 DIAGNOSIS — S52121D Displaced fracture of head of right radius, subsequent encounter for closed fracture with routine healing: Secondary | ICD-10-CM | POA: Diagnosis not present

## 2023-05-26 DIAGNOSIS — M25621 Stiffness of right elbow, not elsewhere classified: Secondary | ICD-10-CM | POA: Diagnosis not present

## 2023-05-29 DIAGNOSIS — M25621 Stiffness of right elbow, not elsewhere classified: Secondary | ICD-10-CM | POA: Diagnosis not present

## 2023-05-29 DIAGNOSIS — S52121D Displaced fracture of head of right radius, subsequent encounter for closed fracture with routine healing: Secondary | ICD-10-CM | POA: Diagnosis not present

## 2023-05-31 DIAGNOSIS — M25561 Pain in right knee: Secondary | ICD-10-CM | POA: Diagnosis not present

## 2023-05-31 DIAGNOSIS — S52131A Displaced fracture of neck of right radius, initial encounter for closed fracture: Secondary | ICD-10-CM | POA: Diagnosis not present

## 2023-06-06 DIAGNOSIS — M25621 Stiffness of right elbow, not elsewhere classified: Secondary | ICD-10-CM | POA: Diagnosis not present

## 2023-06-06 DIAGNOSIS — S52121D Displaced fracture of head of right radius, subsequent encounter for closed fracture with routine healing: Secondary | ICD-10-CM | POA: Diagnosis not present

## 2023-06-12 DIAGNOSIS — S52121D Displaced fracture of head of right radius, subsequent encounter for closed fracture with routine healing: Secondary | ICD-10-CM | POA: Diagnosis not present

## 2023-06-12 DIAGNOSIS — M25621 Stiffness of right elbow, not elsewhere classified: Secondary | ICD-10-CM | POA: Diagnosis not present

## 2023-06-14 ENCOUNTER — Ambulatory Visit: Payer: Medicare Other

## 2023-06-14 ENCOUNTER — Encounter (HOSPITAL_COMMUNITY): Payer: Medicare Other

## 2023-06-21 DIAGNOSIS — E113291 Type 2 diabetes mellitus with mild nonproliferative diabetic retinopathy without macular edema, right eye: Secondary | ICD-10-CM | POA: Diagnosis not present

## 2023-06-21 DIAGNOSIS — I471 Supraventricular tachycardia, unspecified: Secondary | ICD-10-CM | POA: Diagnosis not present

## 2023-06-21 DIAGNOSIS — M792 Neuralgia and neuritis, unspecified: Secondary | ICD-10-CM | POA: Diagnosis not present

## 2023-06-21 DIAGNOSIS — R55 Syncope and collapse: Secondary | ICD-10-CM | POA: Diagnosis not present

## 2023-07-03 DIAGNOSIS — I10 Essential (primary) hypertension: Secondary | ICD-10-CM | POA: Diagnosis not present

## 2023-07-03 DIAGNOSIS — E119 Type 2 diabetes mellitus without complications: Secondary | ICD-10-CM | POA: Diagnosis not present

## 2023-07-03 DIAGNOSIS — R946 Abnormal results of thyroid function studies: Secondary | ICD-10-CM | POA: Diagnosis not present

## 2023-07-03 DIAGNOSIS — D509 Iron deficiency anemia, unspecified: Secondary | ICD-10-CM | POA: Diagnosis not present

## 2023-07-03 DIAGNOSIS — N1832 Chronic kidney disease, stage 3b: Secondary | ICD-10-CM | POA: Diagnosis not present

## 2023-07-10 DIAGNOSIS — E119 Type 2 diabetes mellitus without complications: Secondary | ICD-10-CM | POA: Diagnosis not present

## 2023-07-10 DIAGNOSIS — H52203 Unspecified astigmatism, bilateral: Secondary | ICD-10-CM | POA: Diagnosis not present

## 2023-07-10 DIAGNOSIS — H04123 Dry eye syndrome of bilateral lacrimal glands: Secondary | ICD-10-CM | POA: Diagnosis not present

## 2023-07-10 DIAGNOSIS — H43812 Vitreous degeneration, left eye: Secondary | ICD-10-CM | POA: Diagnosis not present

## 2023-07-10 DIAGNOSIS — H524 Presbyopia: Secondary | ICD-10-CM | POA: Diagnosis not present

## 2023-07-11 DIAGNOSIS — R55 Syncope and collapse: Secondary | ICD-10-CM | POA: Diagnosis not present

## 2023-07-13 DIAGNOSIS — E113291 Type 2 diabetes mellitus with mild nonproliferative diabetic retinopathy without macular edema, right eye: Secondary | ICD-10-CM | POA: Diagnosis not present

## 2023-07-13 DIAGNOSIS — R55 Syncope and collapse: Secondary | ICD-10-CM | POA: Diagnosis not present

## 2023-07-13 DIAGNOSIS — R1110 Vomiting, unspecified: Secondary | ICD-10-CM | POA: Diagnosis not present

## 2023-07-13 DIAGNOSIS — N1832 Chronic kidney disease, stage 3b: Secondary | ICD-10-CM | POA: Diagnosis not present

## 2023-07-13 DIAGNOSIS — I471 Supraventricular tachycardia, unspecified: Secondary | ICD-10-CM | POA: Diagnosis not present

## 2023-07-17 DIAGNOSIS — G5603 Carpal tunnel syndrome, bilateral upper limbs: Secondary | ICD-10-CM | POA: Diagnosis not present

## 2023-07-18 DIAGNOSIS — M1711 Unilateral primary osteoarthritis, right knee: Secondary | ICD-10-CM | POA: Diagnosis not present

## 2023-07-18 DIAGNOSIS — M19012 Primary osteoarthritis, left shoulder: Secondary | ICD-10-CM | POA: Diagnosis not present

## 2023-07-27 DIAGNOSIS — M5416 Radiculopathy, lumbar region: Secondary | ICD-10-CM | POA: Diagnosis not present

## 2023-08-07 DIAGNOSIS — G5603 Carpal tunnel syndrome, bilateral upper limbs: Secondary | ICD-10-CM | POA: Diagnosis not present

## 2023-08-08 ENCOUNTER — Ambulatory Visit (HOSPITAL_COMMUNITY): Payer: Medicare Other

## 2023-08-08 ENCOUNTER — Ambulatory Visit: Payer: Medicare Other

## 2023-08-14 DIAGNOSIS — G5603 Carpal tunnel syndrome, bilateral upper limbs: Secondary | ICD-10-CM | POA: Diagnosis not present

## 2023-08-16 DIAGNOSIS — M5451 Vertebrogenic low back pain: Secondary | ICD-10-CM | POA: Diagnosis not present

## 2023-08-24 DIAGNOSIS — G5601 Carpal tunnel syndrome, right upper limb: Secondary | ICD-10-CM | POA: Diagnosis not present

## 2023-08-25 DIAGNOSIS — G8929 Other chronic pain: Secondary | ICD-10-CM | POA: Diagnosis not present

## 2023-08-25 DIAGNOSIS — M5442 Lumbago with sciatica, left side: Secondary | ICD-10-CM | POA: Diagnosis not present

## 2023-08-28 DIAGNOSIS — M5451 Vertebrogenic low back pain: Secondary | ICD-10-CM | POA: Diagnosis not present

## 2023-08-30 DIAGNOSIS — G5601 Carpal tunnel syndrome, right upper limb: Secondary | ICD-10-CM | POA: Diagnosis not present

## 2023-09-04 DIAGNOSIS — M5451 Vertebrogenic low back pain: Secondary | ICD-10-CM | POA: Diagnosis not present

## 2023-09-08 DIAGNOSIS — M5451 Vertebrogenic low back pain: Secondary | ICD-10-CM | POA: Diagnosis not present

## 2023-09-11 DIAGNOSIS — M5451 Vertebrogenic low back pain: Secondary | ICD-10-CM | POA: Diagnosis not present

## 2023-09-15 DIAGNOSIS — M5451 Vertebrogenic low back pain: Secondary | ICD-10-CM | POA: Diagnosis not present

## 2023-09-18 DIAGNOSIS — M5451 Vertebrogenic low back pain: Secondary | ICD-10-CM | POA: Diagnosis not present

## 2023-09-22 DIAGNOSIS — M5451 Vertebrogenic low back pain: Secondary | ICD-10-CM | POA: Diagnosis not present

## 2023-09-25 DIAGNOSIS — M5451 Vertebrogenic low back pain: Secondary | ICD-10-CM | POA: Diagnosis not present

## 2023-10-17 DIAGNOSIS — M19012 Primary osteoarthritis, left shoulder: Secondary | ICD-10-CM | POA: Diagnosis not present

## 2023-10-17 DIAGNOSIS — M170 Bilateral primary osteoarthritis of knee: Secondary | ICD-10-CM | POA: Diagnosis not present

## 2023-10-24 ENCOUNTER — Ambulatory Visit: Payer: Medicare Other

## 2023-10-24 ENCOUNTER — Ambulatory Visit (HOSPITAL_COMMUNITY)
Admission: RE | Admit: 2023-10-24 | Discharge: 2023-10-24 | Disposition: A | Discharge status: Home / Self Care | Payer: Medicare Other | Source: Ambulatory Visit | Attending: Vascular Surgery | Admitting: Vascular Surgery

## 2023-10-24 DIAGNOSIS — I6523 Occlusion and stenosis of bilateral carotid arteries: Secondary | ICD-10-CM | POA: Diagnosis not present

## 2023-10-25 DIAGNOSIS — M5442 Lumbago with sciatica, left side: Secondary | ICD-10-CM | POA: Diagnosis not present

## 2023-10-25 DIAGNOSIS — G8929 Other chronic pain: Secondary | ICD-10-CM | POA: Diagnosis not present

## 2024-01-16 DIAGNOSIS — M1711 Unilateral primary osteoarthritis, right knee: Secondary | ICD-10-CM | POA: Diagnosis not present

## 2024-01-16 DIAGNOSIS — M1712 Unilateral primary osteoarthritis, left knee: Secondary | ICD-10-CM | POA: Diagnosis not present

## 2024-01-16 DIAGNOSIS — M19012 Primary osteoarthritis, left shoulder: Secondary | ICD-10-CM | POA: Diagnosis not present

## 2024-02-01 DIAGNOSIS — M810 Age-related osteoporosis without current pathological fracture: Secondary | ICD-10-CM | POA: Diagnosis not present

## 2024-02-01 DIAGNOSIS — N1832 Chronic kidney disease, stage 3b: Secondary | ICD-10-CM | POA: Diagnosis not present

## 2024-02-01 DIAGNOSIS — I10 Essential (primary) hypertension: Secondary | ICD-10-CM | POA: Diagnosis not present

## 2024-02-01 DIAGNOSIS — N183 Chronic kidney disease, stage 3 unspecified: Secondary | ICD-10-CM | POA: Diagnosis not present

## 2024-02-09 DIAGNOSIS — M810 Age-related osteoporosis without current pathological fracture: Secondary | ICD-10-CM | POA: Diagnosis not present

## 2024-02-09 DIAGNOSIS — R946 Abnormal results of thyroid function studies: Secondary | ICD-10-CM | POA: Diagnosis not present

## 2024-02-09 DIAGNOSIS — E118 Type 2 diabetes mellitus with unspecified complications: Secondary | ICD-10-CM | POA: Diagnosis not present

## 2024-02-09 DIAGNOSIS — E559 Vitamin D deficiency, unspecified: Secondary | ICD-10-CM | POA: Diagnosis not present

## 2024-02-14 DIAGNOSIS — G8929 Other chronic pain: Secondary | ICD-10-CM | POA: Diagnosis not present

## 2024-02-14 DIAGNOSIS — M47812 Spondylosis without myelopathy or radiculopathy, cervical region: Secondary | ICD-10-CM | POA: Diagnosis not present

## 2024-02-14 DIAGNOSIS — M25562 Pain in left knee: Secondary | ICD-10-CM | POA: Diagnosis not present

## 2024-02-14 DIAGNOSIS — M47816 Spondylosis without myelopathy or radiculopathy, lumbar region: Secondary | ICD-10-CM | POA: Diagnosis not present

## 2024-02-14 DIAGNOSIS — M25561 Pain in right knee: Secondary | ICD-10-CM | POA: Diagnosis not present

## 2024-02-14 DIAGNOSIS — M25512 Pain in left shoulder: Secondary | ICD-10-CM | POA: Diagnosis not present

## 2024-03-14 DIAGNOSIS — M47816 Spondylosis without myelopathy or radiculopathy, lumbar region: Secondary | ICD-10-CM | POA: Diagnosis not present

## 2024-03-28 DIAGNOSIS — M47816 Spondylosis without myelopathy or radiculopathy, lumbar region: Secondary | ICD-10-CM | POA: Diagnosis not present

## 2024-04-08 DIAGNOSIS — Z0000 Encounter for general adult medical examination without abnormal findings: Secondary | ICD-10-CM | POA: Diagnosis not present

## 2024-04-08 DIAGNOSIS — K219 Gastro-esophageal reflux disease without esophagitis: Secondary | ICD-10-CM | POA: Diagnosis not present

## 2024-04-08 DIAGNOSIS — M25551 Pain in right hip: Secondary | ICD-10-CM | POA: Diagnosis not present

## 2024-04-08 DIAGNOSIS — Z23 Encounter for immunization: Secondary | ICD-10-CM | POA: Diagnosis not present

## 2024-04-08 DIAGNOSIS — I10 Essential (primary) hypertension: Secondary | ICD-10-CM | POA: Diagnosis not present

## 2024-04-08 DIAGNOSIS — E559 Vitamin D deficiency, unspecified: Secondary | ICD-10-CM | POA: Diagnosis not present

## 2024-04-08 DIAGNOSIS — I739 Peripheral vascular disease, unspecified: Secondary | ICD-10-CM | POA: Diagnosis not present

## 2024-04-08 DIAGNOSIS — M8589 Other specified disorders of bone density and structure, multiple sites: Secondary | ICD-10-CM | POA: Diagnosis not present

## 2024-04-08 DIAGNOSIS — Z79899 Other long term (current) drug therapy: Secondary | ICD-10-CM | POA: Diagnosis not present

## 2024-04-08 DIAGNOSIS — M25552 Pain in left hip: Secondary | ICD-10-CM | POA: Diagnosis not present

## 2024-04-08 DIAGNOSIS — G8929 Other chronic pain: Secondary | ICD-10-CM | POA: Diagnosis not present

## 2024-04-08 DIAGNOSIS — E1122 Type 2 diabetes mellitus with diabetic chronic kidney disease: Secondary | ICD-10-CM | POA: Diagnosis not present

## 2024-04-11 DIAGNOSIS — M47816 Spondylosis without myelopathy or radiculopathy, lumbar region: Secondary | ICD-10-CM | POA: Diagnosis not present

## 2024-04-25 DIAGNOSIS — M47816 Spondylosis without myelopathy or radiculopathy, lumbar region: Secondary | ICD-10-CM | POA: Diagnosis not present

## 2024-06-10 ENCOUNTER — Other Ambulatory Visit: Payer: Self-pay

## 2024-06-10 ENCOUNTER — Telehealth: Payer: Self-pay | Admitting: Internal Medicine

## 2024-06-10 DIAGNOSIS — I6529 Occlusion and stenosis of unspecified carotid artery: Secondary | ICD-10-CM

## 2024-06-10 NOTE — Telephone Encounter (Signed)
 OK

## 2024-06-10 NOTE — Telephone Encounter (Signed)
 Copied from CRM [id number]. Topic: Appointments - Scheduling Inquiry for Clinic >> Jun 07, 2024  4:58 PM China J wrote: Reason for CRM: The patient was wondering if Dr. Frann would be willing to see her as a new patient since her friend Tilton Agent is being seen by him.  Please call [phone] for an update.

## 2024-06-12 NOTE — Telephone Encounter (Signed)
 Lvm to sched

## 2024-07-15 NOTE — Progress Notes (Signed)
 "  [facility]hthal[facility] - [facility] Visit Note 07/15/24     CHIEF COMPLAINT Patient presents for Diabetic Eye Exam   HISTORY OF PRESENT ILLNESS: Sara Holt is a 89 y.o. female who presents to the clinic today for:   HPI     Diabetic Eye Exam   The patient is here for a return visit.        Comments   LV:07/10/2023.   Patient returns as scheduled for DM exam with no h/o retinopathy.   Patient states vision is seems worse overall esp reading in both eyes. Comfort of eyes is good currently, dryness and burning in the past. Denies eye pain, tearing or photophobia. Denies flashes, floaters or dark curtain/veil.   Uses Blink artifical tears prn both eyes.   NIDDM X 2004 on oral medications  BS= 200 few days  A1C= unsure No results found for: HGBA1C, POCA1C         Last edited by Powell Minus Rattler, COA on 07/15/2024  1:16 PM.      CURRENT MEDICATIONS: Current Outpatient Medications on File Prior to Visit  Medication Sig Dispense Refill   acetaminophen (TYLENOL) 325 mg tablet Take 650 mg by mouth.     amLODIPine (NORVASC) 5 mg tablet Take 1 tablet by mouth daily.     aspirin  81 mg EC tablet Take by mouth daily.     azelastine (ASTELIN) 137 mcg (0.1 %) nasal spray      carvediloL  (COREG ) 25 mg tablet Take 25 mg by mouth in the morning and 25 mg in the evening. Take with meals.     cephALEXin  (KEFLEX ) 500 mg capsule Take 1 capsule (500 mg total) by mouth 4 (four) times a day for 7 days. 28 capsule 0   cholecalciferol (VITAMIN D3) 1,000 unit (25 mcg) tablet Take 1,000 Units by mouth daily.     cyanocobalamin (VITAMIN B12) 1,000 mcg tablet Take 1,000 mcg by mouth daily.     diclofenac  sodium (VOLTAREN ) 1 % gel Apply topically.     DULoxetine  (CYMBALTA ) 30 mg capsule TAKE 1 CAPSULE BY MOUTH  EVERY EVENING 90 capsule 0   fluticasone furoate-vilanteroL (Breo Ellipta) 100-25 mcg/dose dsdv inhaler Inhale 1 puff.     fluticasone propionate  (FLONASE) 50 mcg/spray nasal spray      HYDROcodone-acetaminophen (NORCO) 5-325 mg per tablet Take 1 tablet by mouth every 6 (six) hours as needed.     loratadine (CLARITIN) 10 mg tablet Take 10 mg by mouth daily.     losartan-hydroCHLOROthiazide (HYZAAR) 50-12.5 mg per tablet Take 1 tablet by mouth daily.     lutein 10 mg tab Take by mouth.     metFORMIN (GLUCOPHAGE-XR) 500 mg 24 hr tablet Take 500 mg by mouth in the morning and 500 mg in the evening. Take with meals.     methocarbamoL (ROBAXIN) 500 mg tablet Take 1 tab po tid prn neck pain. 90 tablet 0   methylPREDNISolone  (MEDROL  DOSEPAK) 4 mg 6 day dose pack Take As Directed On Package 21 tablet 0   montelukast (SINGULAIR) 10 mg tablet      Myrbetriq 25 mg Tb24 Take 1 tablet by mouth daily.     omeprazole (PriLOSEC) 40 mg DR capsule Take 40 mg by mouth daily.     OneTouch Delica Plus Lancet 33 gauge misc in the morning and in the evening. check blood sugar.     OneTouch Ultra Test test strip      pyridoxine (VITAMIN B6) 25  mg tablet Take by mouth.     simvastatin  (ZOCOR ) 10 mg tablet Take 10 mg by mouth nightly.     valsartan (DIOVAN) 160 mg tablet TAKE 1 TABLET BY MOUTH DAILY. STOP THE VALSARTAN-HCTZ 90 DAYS     valsartan-hydroCHLOROthiazide (DIOVAN HCT) 160-12.5 mg per tablet Take 1 tablet by mouth daily.     vit C,E-Zn-coppr-lutein-zeaxan (PreserVision AREDS-2) 250-90-40-1 mg cap capsule      No current facility-administered medications on file prior to visit.    Referring physician: No referring provider defined for this encounter.  ALLERGIES Allergies  Allergen Reactions   Buprenorphine Hcl GI Intolerance    Other reaction(s): Nausea And Vomiting, nausea, nausea, Other reaction(s): Nausea And Vomiting   Morphine GI Intolerance    Other reaction(s): Nausea And Vomiting, Other reaction(s): Nausea And Vomiting   Oxycodone Psychosis    Other reaction(s): Hallucinations   Hydralazine  Hcl Other (See Comments)     Unknown reaction   Sulfamethoxazole GI Intolerance   Tramadol  Hcl     Other reaction(s): pain    PAST MEDICAL HISTORY Past Medical History:  Diagnosis Date   Acute kidney injury superimposed on chronic kidney disease 06/10/2016   Anemia    Arthritis    Asthma (CMD)    Carotid artery occlusion    Diabetes mellitus type II    (CMD)    GERD (gastroesophageal reflux disease)    Hypertension    PAD (peripheral artery disease)    Sleep apnea    Uses CPAP Machine   Past Surgical History:  Procedure Laterality Date   CAROTID ENDARTERECTOMY Left    Procedure: CAROTID ENDARTERECTOMY   CARPAL TUNNEL RELEASE Bilateral    Procedure: CARPAL TUNNEL RELEASE; 16 yrs ago   CATARACT EXTRACTION     Procedure: CATARACT EXTRACTION; OS 04/23/2019, OD 02/26/2019 [facility] IOL   EPIDURAL BLOCK INJECTION Bilateral 03/14/2024   BLOCK/INJECTION MEDIAL BRANCH LUMBAR FACET   L4-S1 BILATERAL #1/2 performed by Darwyn Blanch, MD at [facility] OR   EPIDURAL BLOCK INJECTION Bilateral 03/28/2024   BLOCK/INJECTION MEDIAL BRANCH LUMBAR FACET  L4-S1 BILATERAL #2/2 performed by Darwyn Blanch, MD at [facility] OR   JOINT REPLACEMENT     Procedure: JOINT REPLACEMENT   KNEE SURGERY     Procedure: KNEE SURGERY   RHIZOTOMY W/ RADIOFREQUENCY ABLATION Left 04/11/2024   RHIZOTOMY FACET RADIOFREQUENCY  LEFT L4-S1 #1/2 performed by Janus Blanch, MD at [facility] OR   RHIZOTOMY W/ RADIOFREQUENCY ABLATION Right 04/25/2024   RHIZOTOMY FACET RADIOFREQUENCY  RIGHT L4-S1 #2/2 performed by Darwyn Blanch, MD at [facility] OR   SACROILIAC JOINT INJECTION Left 05/21/2024   INJECTION/ASPIRATION JOINT SACROILLIAC Left performed by Janus Blanch, MD at [facility] OR   STEROID INJECTION HIP Bilateral 06/06/2024   INJECTION/ASPIRATION JOINT HIP BILATERAL GTB with U/S performed by Janus Blanch, MD at [facility] OR   TUBAL LIGATION     Procedure: TUBAL LIGATION    FAMILY HISTORY Family History  Problem  Relation Name Age of Onset   Diabetes Father     Cancer Maternal Grandmother     Heart disease Maternal Grandfather     Arthritis Daughter     Anesthesia problems Neg Hx     Cerebral palsy Neg Hx     Clotting disorder Neg Hx     Club foot Neg Hx     Collagen disease Neg Hx     Deep vein thrombosis Neg Hx     Gait  disorder Neg Hx     Gout Neg Hx     Hip dysplasia Neg Hx     Hip fracture Neg Hx     Hypertension Neg Hx     Hypermobility Neg Hx     Osteoporosis Neg Hx     Other Neg Hx     Pulmonary disease Neg Hx     Rheumatologic disease Neg Hx     Scoliosis Neg Hx     Spina bifida Neg Hx     Stroke Neg Hx     Thyroid  disease Neg Hx     Vasculitis Neg Hx     Glaucoma Neg Hx     Macular degeneration Neg Hx      SOCIAL HISTORY Social History   Tobacco Use   Smoking status: Never   Smokeless tobacco: Never  Substance Use Topics   Alcohol use: Yes    Alcohol/week: 25.0 standard drinks of alcohol   Drug use: No          OPHTHALMIC EXAM:  Base Eye Exam     Visual Acuity (Snellen - Linear)       Right Left   Dist cc 20/30 20/25    Correction: Glasses         Tonometry (Applanation: Fluress OU, 1:26 PM)       Right Left   Pressure 12 13         Pupils       Pupils   Right PERRL   Left PERRL         Visual Fields       Left Right    Full Full         Extraocular Movement       Right Left    Full Full         Neuro/Psych     Oriented x3: Yes   Mood/Affect: Normal         Dilation     Both eyes: 2.5% Phenylephrine, 1.0% Tropicamide @ 1:26 PM  Provided sunglasses to patient   Used separate bottles (2.5% Phenylephrine and 1.0% Tropicamide)  House combo drops unavailable.           Slit Lamp and Fundus Exam     External Exam       Right Left   External Normal Normal         Slit Lamp Exam       Right Left   Lids/Lashes Normal Normal   Conjunctiva/Sclera White and quiet White  and quiet   Cornea 1-2+ PEK 1-2+ PEK   Anterior Chamber Deep and quiet Deep and quiet   Iris Dilates well Dilates well   Lens PC IOL PC IOL, 1+ PCO         Fundus Exam       Right Left   Posterior Vitreous Normal PVD, Weiss ring   Disc Normal Normal   C/D Ratio 0.35 0.3   Macula pigment mottling pigment mottling   Vessels Vascular attenuation Tortuous   Periphery Normal Normal           Refraction     Wearing Rx       Sphere Cylinder Axis Add   Right [place] -0.50 102 +2.75   Left +0.25 -0.75 061 +2.75    Age: [birth year]27   Type: PAL         Manifest Refraction       Sphere Cylinder Axis Dist [place] Add [place]  Right +1.00 -1.75 105 20/20 +2.75 J1+   Left +0.25 -1.25 071 20/20 +2.75 J1+         Manifest Refraction #2 (Auto)       Sphere Cylinder Axis Dist VA Add [place]   Right +1.25 -2.00 106      Left +0.25 -1.25 072            Final Rx       Sphere Cylinder Axis Dist [place] Add [place]   Right +1.00 -1.75 105 20/20 +2.75 J1+   Left +0.25 -1.25 071 20/20 +2.75 J1+    Expiration Date: 07/15/2026  Remark: UV filter, Polycarbonate, Tint of choice Doctor Recommendations:Anti-Reflective                IMAGING AND PROCEDURES:   ASSESSMENT/PLAN:  1. Diabetes mellitus type 2 without retinopathy    (CMD)      2. Long term current use of oral hypoglycemic drug      3. Posterior capsular opacification non visually significant, left      4. Dry eye syndrome of both lacrimal glands      5. Posterior vitreous detachment of left eye      6. Hypertensive retinopathy of both eyes      7. Macular RPE mottling      8. Astigmatism of both eyes with presbyopia         Diabetic Type II:              No evidence of retinopathy, encourage pt to maintain control of A1C.  PCO OS  - Not visually significant, continue to monitor.  Dry eye OU  Discussed with pt today  Recommend starting AT's BID OU - list of drops given today   PVD OS: - No retinal  tears or detachments seen; RD Precautions given.   HTN retinopathy  -glucose, BP, lipid control per PCP   Mottling OU  Mild, monitor  Refractive error OU:  - new MRx given today  Medication ordered this visit:  No orders of the defined types were placed in this encounter.      Return in about 1 year (around 07/15/2025) for DEE, MAC OCT.  Patient Instructions  Use artificial tears- one drop in both eyes twice a day  .    Explained the diagnoses, plan, and follow up with the patient and they expressed understanding.  Patient expressed understanding of the importance of proper follow up care.   This document serves as a record of services personally performed by Comer Jerilee Lauth, MD.  It was created on their behalf by Harlene Earnie Ming, COA, a trained medical scribe, and Certified Ophthalmic Assistant (COA). During the course of documenting the history, physical exam and medical decision making, I was functioning as a stage manager. The creation of this record is the providers dictation and/or activities during the visit.  Electronically signed by Harlene Earnie Ming, COA 07/15/2024 1:37 PM   Abbreviations: M myopia (nearsighted); A astigmatism; H hyperopia (farsighted); P presbyopia; Mrx spectacle prescription;  CTL contact lenses; OD right eye; OS left eye; OU both eyes  XT exotropia; ET esotropia; PEK punctate epithelial keratitis; PEE punctate epithelial erosions; DES dry eye syndrome; MGD meibomian gland dysfunction; ATs artificial tears; PFAT's preservative free artificial tears; NSC nuclear sclerotic cataract; PSC posterior subcapsular cataract; ERM epi-retinal membrane; PVD posterior vitreous detachment; RD retinal detachment; DM diabetes mellitus; DR diabetic retinopathy; NPDR non-proliferative diabetic retinopathy; PDR proliferative diabetic retinopathy; CSME clinically significant  macular edema; DME diabetic macular edema; dbh dot blot hemorrhages; CWS cotton wool  spot; POAG primary open angle glaucoma; C/D cup-to-disc ratio; HVF humphrey visual field; GVF goldmann visual field; OCT optical coherence tomography; IOP intraocular pressure; BRVO Branch retinal vein occlusion; CRVO central retinal vein occlusion; CRAO central retinal artery occlusion; BRAO branch retinal artery occlusion; RT retinal tear; SB scleral buckle; PPV pars plana vitrectomy; VH Vitreous hemorrhage; PRP panretinal laser photocoagulation; IVK intravitreal kenalog; VMT vitreomacular traction; MH Macular hole;  NVD neovascularization of the disc; NVE neovascularization elsewhere; AREDS age related eye disease study; ARMD age related macular degeneration; POAG primary open angle glaucoma; EBMD epithelial/anterior basement membrane dystrophy; ACIOL anterior chamber intraocular lens; IOL intraocular lens; PCIOL posterior chamber intraocular lens; Phaco/IOL phacoemulsification with intraocular lens placement; PRK photorefractive keratectomy; LASIK laser assisted in situ keratomileusis; HTN hypertension; DM diabetes mellitus; COPD chronic obstructive pulmonary disease   Electronically signed by: Comer Jerilee Lauth, MD 07/15/2024 2:21 PM  "

## 2024-07-16 ENCOUNTER — Encounter: Payer: Self-pay | Admitting: Physician Assistant

## 2024-07-16 ENCOUNTER — Ambulatory Visit (HOSPITAL_COMMUNITY)
Admission: RE | Admit: 2024-07-16 | Discharge: 2024-07-16 | Disposition: A | Discharge status: Home / Self Care | Source: Ambulatory Visit | Attending: Surgery | Admitting: Surgery

## 2024-07-16 ENCOUNTER — Ambulatory Visit: Admitting: Physician Assistant

## 2024-07-16 VITALS — BP 131/76 | HR 70 | Temp 97.7°F | Wt 138.1 lb

## 2024-07-16 DIAGNOSIS — I6523 Occlusion and stenosis of bilateral carotid arteries: Secondary | ICD-10-CM | POA: Diagnosis not present

## 2024-07-16 DIAGNOSIS — I6529 Occlusion and stenosis of unspecified carotid artery: Secondary | ICD-10-CM | POA: Diagnosis not present

## 2024-07-16 NOTE — Progress Notes (Unsigned)
 " HISTORY AND PHYSICAL     CC:  follow up. Requesting Provider:  Gretta Lonni PARAS, MD  HPI: This is a 89 y.o. female here for follow up for carotid artery stenosis.  Pt is s/p left CEA for symptomatic carotid artery stenosis on 07/19/2010 by Dr. Oris.    Pt was last seen 12/13/2022 and at that time she was not having any stroke like symptoms.  She was having some right knee and left shoulder pain and was scheduled to f/u with ortho.  Her right ICA was 40-59% and previous was 60-79% on the right.  She was scheduled for a 6 month follow up to ensure she was 40-59%.  She did have her duplex that revealed 40-59% stenosis on the right and 1-39% on the left but was not seen by provider.   Pt returns today for follow up and here with her friend.    Pt denies any amaurosis fugax, speech difficulties, weakness, numbness, paralysis or clumsiness or facial droop.  She states that over the past 3 weeks, she has had some vision changes where she could not see the tv or a book she was reading.  She states this happened in both eyes.  She went to the ophthalmologist yesterday and was given a good report.  She states that when Dr. Oris had worked on  her carotid, she had blindness in one eye like a shade coming down and back up.  She states this vision change was not like that.   She is compliant with her asa and statin.    She states she has some generalized pain as well as back pain.  She has received epidural steroid injections but they did not last.  She does take hydrocodone for her pain.   The pt is on a statin for cholesterol management.  The pt is on a daily aspirin .   Other AC:  none The pt is on ARB, diuretic, BB for hypertension.   The pt is  on medication for diabetes Tobacco hx:  never   Past Medical History:  Diagnosis Date   Allergy    Anemia    Arthritis    OA   Asthma    Carotid artery occlusion    Diabetes mellitus    DIET CONTROLLED, NO MEDS   Disc    problems in neck and  back   GERD (gastroesophageal reflux disease)    Hyperlipidemia    Hypertension    Neuromuscular disorder (HCC)    nerve problems with hands   Osteoporosis    Sleep apnea     Past Surgical History:  Procedure Laterality Date   CAROTID ARTERY ANGIOPLASTY     CAROTID ENDARTERECTOMY  Jan. 16,2012   LEFT cea   carpel tunnel     both hands   COLONOSCOPY     JOINT REPLACEMENT Left 2011   Knee   KNEE ARTHROSCOPY Left 2010   POLYPECTOMY      Allergies[1]  Current Outpatient Medications  Medication Sig Dispense Refill   acetaminophen (TYLENOL) 325 MG tablet Take 650 mg by mouth every 6 (six) hours as needed. (Patient not taking: Reported on 12/13/2022)     amLODipine (NORVASC) 5 MG tablet Take 1 tablet by mouth daily.     ammonium lactate (AMLACTIN) 12 % cream APPLY TOPICALLY TO THE AFFECTED AREA EVERY DAY AFTER THE SHOWER     aspirin  EC 81 MG tablet Take 81 mg by mouth daily.     azelastine (ASTELIN)  0.1 % nasal spray U 1 SPR IEN BID PRN  5   carvedilol  (COREG ) 12.5 MG tablet TAKE 1 TABLET BY MOUTH TWO  TIMES DAILY     chlorpheniramine-HYDROcodone (TUSSIONEX) 10-8 MG/5ML SUER SMARTSIG:5 Milliliter(s) By Mouth Every 12 Hours PRN     cholecalciferol (VITAMIN D) 1000 units tablet Take 1,000 Units by mouth daily.     cyanocobalamin 1000 MCG tablet Take by mouth.     CYMBALTA  60 MG capsule Take 1 tablet by mouth daily.      desoximetasone (TOPICORT) 0.25 % cream Apply topically continuous as needed (skin allergy).      diclofenac  sodium (VOLTAREN ) 1 % GEL Apply 4 g topically as needed (pain).      fluticasone (FLONASE) 50 MCG/ACT nasal spray      fluticasone furoate-vilanterol (BREO ELLIPTA) 100-25 MCG/INH AEPB Inhale 1 puff into the lungs once.     glimepiride (AMARYL) 4 MG tablet Take 4 mg by mouth.     glucose blood test strip      HYDROcodone-acetaminophen (NORCO/VICODIN) 5-325 MG tablet 1 tablet     loratadine (CLARITIN) 10 MG tablet Take 10 mg by mouth.     Lutein 10 MG TABS  Take by mouth.     metFORMIN (GLUCOPHAGE-XR) 500 MG 24 hr tablet Take 1 mg by mouth daily with breakfast.      methocarbamol (ROBAXIN) 500 MG tablet Take 500 mg by mouth daily as needed.     montelukast (SINGULAIR) 10 MG tablet      mupirocin  ointment (BACTROBAN ) 2 % Apply 1 Application topically 2 (two) times daily. To affected area till better 22 g 0   omeprazole (PRILOSEC) 40 MG capsule Take 40 mg by mouth.     ONE TOUCH ULTRA TEST test strip   0   OVER THE COUNTER MEDICATION Place 1 drop into both eyes at bedtime. dry eyes med     oxybutynin (DITROPAN) 5 MG tablet Take 5 mg by mouth daily.     simvastatin  (ZOCOR ) 10 MG tablet Take 10 mg by mouth Daily.      valsartan-hydrochlorothiazide (DIOVAN-HCT) 160-12.5 MG tablet 1 tablet     No current facility-administered medications for this visit.    Family History  Problem Relation Age of Onset   Diabetes Father    Pneumonia Father    Peripheral vascular disease Sister    Colon cancer Maternal Grandmother    Cancer Maternal Grandmother        Colon cancer   Esophageal cancer Neg Hx    Stomach cancer Neg Hx    Rectal cancer Neg Hx     Social History   Socioeconomic History   Marital status: Married    Spouse name: Not on file   Number of children: 2   Years of education: Not on file   Highest education level: Not on file  Occupational History   Occupation: Retired    Associate Professor: RETIRED  Tobacco Use   Smoking status: Never    Passive exposure: Never   Smokeless tobacco: Never  Vaping Use   Vaping status: Never Used  Substance and Sexual Activity   Alcohol use: Yes    Comment: occassionally   Drug use: No   Sexual activity: Not on file  Other Topics Concern   Not on file  Social History Narrative   Not on file   Social Drivers of Health   Tobacco Use: Low Risk (07/15/2024)   Received from [facility]   Patient History  Smoking Tobacco Use: Never    Smokeless Tobacco Use: Never    Passive Exposure: Not on  file  Financial Resource Strain: Not on file  Food Insecurity: Not on file  Transportation Needs: Not on file  Physical Activity: Not on file  Stress: Not on file  Social Connections: Unknown (11/16/2021)   Received from [facility]   Social Network    Social Network: Not on file  Intimate Partner Violence: Unknown (10/08/2021)   Received from [facility]   HITS    Physically Hurt: Not on file    Insult or Talk Down To: Not on file    Threaten Physical Harm: Not on file    Scream or Curse: Not on file  Depression (PHQ2-9): Not on file  Alcohol Screen: Not on file  Housing: Not on file  Utilities: Not on file  Health Literacy: Not on file     REVIEW OF SYSTEMS:   [X]  denotes positive finding, [ ]  denotes negative finding Cardiac  Comments:  Chest pain or chest pressure:    Shortness of breath upon exertion:    Short of breath when lying flat:    Irregular heart rhythm:        Vascular    Pain in calf, thigh, or hip brought on by ambulation:    Pain in feet at night that wakes you up from your sleep:     Blood clot in your veins:    Leg swelling:         Pulmonary    Oxygen at home:    Productive cough:     Wheezing:         Neurologic    Sudden weakness in arms or legs:     Sudden numbness in arms or legs:     Sudden onset of difficulty speaking or slurred speech:    Temporary loss of vision in one eye:     Problems with dizziness:         Gastrointestinal    Blood in stool:     Vomited blood:         Genitourinary    Burning when urinating:     Blood in urine:        Psychiatric    Major depression:         Hematologic    Bleeding problems:    Problems with blood clotting too easily:        Skin    Rashes or ulcers:        Constitutional    Fever or chills:      PHYSICAL EXAMINATION:  Today's Vitals   07/16/24 1455 07/16/24 1458  BP: (!) 146/75 131/76  Pulse: 72 70  Temp: 97.7 F (36.5 C)   TempSrc: Temporal   Weight: 138 lb 1.6 oz  (62.6 kg)   PainSc: 0-No pain    Body mass index is 23.7 kg/m.   General:  WDWN in NAD; vital signs documented above Gait: Not observed HENT: WNL, normocephalic Pulmonary: normal non-labored breathing Cardiac: regular HR, without carotid bruits Skin: without rashes Vascular Exam/Pulses:  Right Left  Radial 2+ (normal) 2+ (normal)  AT 2+ (normal) 1+ (weak)   Extremities: without open wounds Musculoskeletal: no muscle wasting or atrophy  Neurologic: A&O X 3; moving all extremities equally; speech is fluent/normal Psychiatric:  The pt has Normal affect.   Non-Invasive Vascular Imaging:   Carotid Duplex on 07/16/2024 Right: 60-79% ICA stenosis Left:   1-39% ICA stenosis  Previous Carotid duplex on 10/24/2023: Right:  40-59% ICA stenosis Left:  1-39% ICA stenosis    ASSESSMENT/PLAN:: 89 y.o. female here for follow up carotid artery stenosis and has hx of left CEA for symptomatic carotid artery stenosis on 07/19/2010 by Dr. Oris.    -duplex today reveals the right is in the 60-79% range, which it has been before.  The 40-59% stenosis on the duplex last year was felt to be underestimated.   -discussed s/s of stroke with pt and she understands should she develop any of these sx, she will go to the nearest ER or call 911. -pt will f/u in 6 months with carotid duplex -pt will call sooner should she have any issues. -continue statin/asa    Lucie Apt, [facility] [phone]  Clinic MD:  Gretta     [1]  Allergies Allergen Reactions   Buprenorphine Hcl Nausea And Vomiting    Other reaction(s): Nausea And Vomiting nausea nausea Other reaction(s): Nausea And Vomiting   Morphine Nausea And Vomiting    Other reaction(s): Nausea And Vomiting Other reaction(s): Nausea And Vomiting   Sulfa Antibiotics Nausea And Vomiting    deathly ill Other reaction(s): vomiting   Sulfacetamide Sodium Nausea And Vomiting   Oxycodone Hcl     Other  reaction(s): Hallucinations   Morphine Sulfate Other (See Comments)   Tramadol  Hcl     Other reaction(s): pain   Sulfamethoxazole Nausea Only   "

## 2024-07-17 ENCOUNTER — Encounter: Payer: Self-pay | Admitting: Physician Assistant

## 2024-07-17 ENCOUNTER — Other Ambulatory Visit: Payer: Self-pay

## 2024-07-17 DIAGNOSIS — I6523 Occlusion and stenosis of bilateral carotid arteries: Secondary | ICD-10-CM

## 2025-01-14 ENCOUNTER — Ambulatory Visit (HOSPITAL_COMMUNITY)

## 2025-01-14 ENCOUNTER — Ambulatory Visit
